# Patient Record
Sex: Male | Born: 1960 | Race: White | Hispanic: No | Smoking: Current every day smoker
Health system: Southern US, Community
[De-identification: ages and names within clinical notes are randomized; demographics above are authoritative.]

## PROBLEM LIST (undated history)

## (undated) DIAGNOSIS — R51 Headache: Secondary | ICD-10-CM

## (undated) DIAGNOSIS — K219 Gastro-esophageal reflux disease without esophagitis: Secondary | ICD-10-CM

## (undated) DIAGNOSIS — J449 Chronic obstructive pulmonary disease, unspecified: Secondary | ICD-10-CM

## (undated) DIAGNOSIS — K5909 Other constipation: Secondary | ICD-10-CM

## (undated) DIAGNOSIS — R519 Headache, unspecified: Secondary | ICD-10-CM

## (undated) DIAGNOSIS — I10 Essential (primary) hypertension: Secondary | ICD-10-CM

## (undated) DIAGNOSIS — F32A Depression, unspecified: Secondary | ICD-10-CM

## (undated) DIAGNOSIS — F329 Major depressive disorder, single episode, unspecified: Secondary | ICD-10-CM

## (undated) HISTORY — DX: Headache: R51

## (undated) HISTORY — DX: Headache, unspecified: R51.9

## (undated) HISTORY — PX: OTHER SURGICAL HISTORY: SHX169

## (undated) HISTORY — PX: APPENDECTOMY: SHX54

## (undated) HISTORY — DX: Other constipation: K59.09

## (undated) HISTORY — PX: UPPER GASTROINTESTINAL ENDOSCOPY: SHX188

## (undated) HISTORY — DX: Gastro-esophageal reflux disease without esophagitis: K21.9

---

## 2001-04-20 ENCOUNTER — Ambulatory Visit (HOSPITAL_COMMUNITY): Admission: RE | Admit: 2001-04-20 | Discharge: 2001-04-20 | Payer: Self-pay | Admitting: Internal Medicine

## 2001-04-20 ENCOUNTER — Encounter: Payer: Self-pay | Admitting: Internal Medicine

## 2001-05-26 ENCOUNTER — Ambulatory Visit (HOSPITAL_COMMUNITY): Admission: RE | Admit: 2001-05-26 | Discharge: 2001-05-26 | Payer: Self-pay | Admitting: Internal Medicine

## 2001-06-26 ENCOUNTER — Ambulatory Visit (HOSPITAL_COMMUNITY): Admission: RE | Admit: 2001-06-26 | Discharge: 2001-06-26 | Payer: Self-pay | Admitting: Internal Medicine

## 2001-06-26 ENCOUNTER — Encounter (INDEPENDENT_AMBULATORY_CARE_PROVIDER_SITE_OTHER): Payer: Self-pay | Admitting: Internal Medicine

## 2001-08-13 ENCOUNTER — Ambulatory Visit (HOSPITAL_COMMUNITY): Admission: RE | Admit: 2001-08-13 | Discharge: 2001-08-13 | Payer: Self-pay | Admitting: Family Medicine

## 2001-08-13 ENCOUNTER — Encounter: Payer: Self-pay | Admitting: Family Medicine

## 2001-08-26 ENCOUNTER — Ambulatory Visit (HOSPITAL_COMMUNITY): Admission: RE | Admit: 2001-08-26 | Discharge: 2001-08-26 | Payer: Self-pay | Admitting: Internal Medicine

## 2001-08-26 ENCOUNTER — Encounter (INDEPENDENT_AMBULATORY_CARE_PROVIDER_SITE_OTHER): Payer: Self-pay | Admitting: Internal Medicine

## 2003-04-12 ENCOUNTER — Encounter: Payer: Self-pay | Admitting: Internal Medicine

## 2003-04-12 ENCOUNTER — Ambulatory Visit (HOSPITAL_COMMUNITY): Admission: RE | Admit: 2003-04-12 | Discharge: 2003-04-12 | Payer: Self-pay | Admitting: Internal Medicine

## 2003-11-02 ENCOUNTER — Ambulatory Visit (HOSPITAL_COMMUNITY): Admission: RE | Admit: 2003-11-02 | Discharge: 2003-11-02 | Payer: Self-pay | Admitting: Internal Medicine

## 2003-11-21 ENCOUNTER — Encounter: Admission: RE | Admit: 2003-11-21 | Discharge: 2003-11-21 | Payer: Self-pay | Admitting: Oncology

## 2003-11-29 ENCOUNTER — Ambulatory Visit (HOSPITAL_COMMUNITY): Admission: RE | Admit: 2003-11-29 | Discharge: 2003-11-29 | Payer: Self-pay | Admitting: Internal Medicine

## 2003-12-19 ENCOUNTER — Ambulatory Visit (HOSPITAL_COMMUNITY): Admission: RE | Admit: 2003-12-19 | Discharge: 2003-12-19 | Payer: Self-pay | Admitting: Internal Medicine

## 2004-01-20 ENCOUNTER — Ambulatory Visit (HOSPITAL_COMMUNITY): Admission: RE | Admit: 2004-01-20 | Discharge: 2004-01-20 | Payer: Self-pay | Admitting: Internal Medicine

## 2004-03-19 ENCOUNTER — Ambulatory Visit (HOSPITAL_COMMUNITY): Admission: RE | Admit: 2004-03-19 | Discharge: 2004-03-19 | Payer: Self-pay | Admitting: Internal Medicine

## 2004-04-13 ENCOUNTER — Ambulatory Visit (HOSPITAL_COMMUNITY): Admission: RE | Admit: 2004-04-13 | Discharge: 2004-04-13 | Payer: Self-pay | Admitting: Internal Medicine

## 2004-05-29 ENCOUNTER — Ambulatory Visit (HOSPITAL_COMMUNITY): Admission: RE | Admit: 2004-05-29 | Discharge: 2004-05-29 | Payer: Self-pay | Admitting: Internal Medicine

## 2004-10-10 ENCOUNTER — Ambulatory Visit (HOSPITAL_COMMUNITY): Admission: RE | Admit: 2004-10-10 | Discharge: 2004-10-10 | Payer: Self-pay | Admitting: Internal Medicine

## 2004-10-18 ENCOUNTER — Ambulatory Visit (HOSPITAL_COMMUNITY): Admission: RE | Admit: 2004-10-18 | Discharge: 2004-10-18 | Payer: Self-pay | Admitting: Internal Medicine

## 2004-11-14 ENCOUNTER — Encounter: Admission: RE | Admit: 2004-11-14 | Discharge: 2004-11-14 | Payer: Self-pay | Admitting: Neurology

## 2004-11-29 ENCOUNTER — Other Ambulatory Visit: Admission: RE | Admit: 2004-11-29 | Discharge: 2004-11-29 | Payer: Self-pay | Admitting: Unknown Physician Specialty

## 2005-08-28 ENCOUNTER — Ambulatory Visit (HOSPITAL_COMMUNITY): Admission: RE | Admit: 2005-08-28 | Discharge: 2005-08-28 | Payer: Self-pay | Admitting: Family Medicine

## 2005-09-03 ENCOUNTER — Ambulatory Visit: Payer: Self-pay | Admitting: Internal Medicine

## 2005-09-10 ENCOUNTER — Emergency Department (HOSPITAL_COMMUNITY): Admission: EM | Admit: 2005-09-10 | Discharge: 2005-09-11 | Payer: Self-pay | Admitting: Emergency Medicine

## 2005-12-02 ENCOUNTER — Ambulatory Visit: Payer: Self-pay | Admitting: Internal Medicine

## 2005-12-23 ENCOUNTER — Ambulatory Visit (HOSPITAL_COMMUNITY): Admission: RE | Admit: 2005-12-23 | Discharge: 2005-12-23 | Payer: Self-pay | Admitting: Internal Medicine

## 2006-07-16 ENCOUNTER — Ambulatory Visit: Payer: Self-pay | Admitting: Internal Medicine

## 2006-08-05 HISTORY — PX: COLONOSCOPY: SHX174

## 2006-11-13 ENCOUNTER — Ambulatory Visit (HOSPITAL_COMMUNITY): Admission: RE | Admit: 2006-11-13 | Discharge: 2006-11-13 | Payer: Self-pay | Admitting: Family Medicine

## 2006-11-26 ENCOUNTER — Ambulatory Visit: Payer: Self-pay | Admitting: Internal Medicine

## 2006-12-08 ENCOUNTER — Ambulatory Visit (HOSPITAL_COMMUNITY): Admission: RE | Admit: 2006-12-08 | Discharge: 2006-12-08 | Payer: Self-pay | Admitting: *Deleted

## 2007-01-12 ENCOUNTER — Ambulatory Visit: Payer: Self-pay | Admitting: Internal Medicine

## 2007-01-12 ENCOUNTER — Ambulatory Visit (HOSPITAL_COMMUNITY): Admission: RE | Admit: 2007-01-12 | Discharge: 2007-01-12 | Payer: Self-pay | Admitting: Internal Medicine

## 2007-01-12 ENCOUNTER — Encounter (INDEPENDENT_AMBULATORY_CARE_PROVIDER_SITE_OTHER): Payer: Self-pay | Admitting: Internal Medicine

## 2007-02-10 ENCOUNTER — Ambulatory Visit: Payer: Self-pay | Admitting: Gastroenterology

## 2008-06-01 ENCOUNTER — Ambulatory Visit (HOSPITAL_COMMUNITY): Admission: RE | Admit: 2008-06-01 | Discharge: 2008-06-01 | Payer: Self-pay | Admitting: Family Medicine

## 2009-04-27 ENCOUNTER — Ambulatory Visit (HOSPITAL_COMMUNITY): Admission: RE | Admit: 2009-04-27 | Discharge: 2009-04-27 | Payer: Self-pay | Admitting: Family Medicine

## 2010-07-02 ENCOUNTER — Ambulatory Visit (HOSPITAL_COMMUNITY): Admission: RE | Admit: 2010-07-02 | Discharge: 2010-07-02 | Payer: Self-pay | Admitting: Family Medicine

## 2010-10-08 ENCOUNTER — Ambulatory Visit (HOSPITAL_COMMUNITY)
Admission: RE | Admit: 2010-10-08 | Discharge: 2010-10-08 | Payer: BC Managed Care – PPO | Source: Ambulatory Visit | Attending: Internal Medicine | Admitting: Internal Medicine

## 2010-10-09 ENCOUNTER — Ambulatory Visit (HOSPITAL_COMMUNITY)
Admission: RE | Admit: 2010-10-09 | Discharge: 2010-10-09 | Disposition: A | Discharge status: Home / Self Care | Payer: BC Managed Care – PPO | Source: Ambulatory Visit | Attending: Internal Medicine | Admitting: Internal Medicine

## 2010-10-09 DIAGNOSIS — R0989 Other specified symptoms and signs involving the circulatory and respiratory systems: Secondary | ICD-10-CM | POA: Insufficient documentation

## 2010-10-09 DIAGNOSIS — R0609 Other forms of dyspnea: Secondary | ICD-10-CM | POA: Insufficient documentation

## 2010-10-09 LAB — BLOOD GAS, ARTERIAL
Acid-Base Excess: 1.1 mmol/L (ref 0.0–2.0)
Acid-base deficit: 1.6 mmol/L (ref 0.0–2.0)
FIO2: 21 %
O2 Saturation: 98.9 %
TCO2: 21.2 mmol/L (ref 0–100)
pCO2 arterial: 35.2 mmHg (ref 35.0–45.0)

## 2010-12-18 NOTE — Op Note (Signed)
[name], [name]             ACCOUNT NO.:  [id number]   MEDICAL RECORD NO.:  [id number]          PATIENT TYPE:  AMB   LOCATION:  DAY                           FACILITY:  [facility]   PHYSICIAN:  Lionel December, M.D.    DATE OF BIRTH:  Feb 19, 1961   DATE OF PROCEDURE:  01/12/2007  DATE OF DISCHARGE:                               OPERATIVE REPORT   PROCEDURE:  Colonoscopy.   INDICATION:  Isaac Rose is a 50 year old Caucasian male who was recently  evaluated per Dr. Yetta Numbers for abdominal cramps.  He had a  CT.  which showed thickening to the ascending colon as well as the hepatic  flexure and mesenteric stranding.  He had a scant amount of fluid in the  paracolic gutter.  The patient was treated with antibiotics.  He was  seen by [place] about 6 weeks ago.  He was also having chest pain.  Therefore, his colonoscopy was postponed.  He has seen Dr. Domingo Sep and  his cardiac evaluation has been negative.  He still has intermittent  abdominal cramps but has not had pain as he had had in April 2008.  He  is undergoing diagnostic colonoscopy.  The procedure risks were reviewed  with the patient, informed consent was obtained.   MEDICATIONS FOR CONSCIOUS SEDATION:  Demerol 50 mg IV, Versed 10 mg IV.   FINDINGS:  Procedure performed in endoscopy suite.  The patient's vital  signs and O2 saturation were monitored during procedure and remained  stable.  The patient complained of intense anal pain.  He states it  started last night after he took prep.  Digital exam was difficult  because of intense spasm of sphincter ani.  Lidocaine jelly was applied  to anal canal.  After a few minutes I was able to complete the digital  exam and passed the pediatric scope across the anal canal into rectum.  The scope was passed into the sigmoid colon and beyond.  Preparation was  excellent.  A somewhat redundant colon but the scope was advanced to the  cecum, which was identified by the appendiceal orifice and  ileocecal  valve.  A short segment of TI was also examined and was normal.  As the  scope was withdrawn, colonic mucosa was carefully examined.  There were  2 small polyps at the ascending colon, both of which were biopsied, and  2 small polyps were noted at the ascending colon, which were ablated via  cold biopsy and submitted in one container.  Mucosa was normal.  There  were no diverticular changes.  The rectal mucosa similarly was normal.  The scope was retroflexed to examine anorectal junction.  No anal  fissure was evident.  He had small hemorrhoids below the dentate line  and 2 anal papillae.  Endoscope was straightened and withdrawn.  The  patient tolerated the procedure well.   FINAL DIAGNOSES:  1. Digital rectal exam suspicious for anal fissure but none seen on      retroflex view.  He could have small fissure, which could easily      have been missed.  2.  No evidence of colitis or tumor mass involving his right colon.      Two 3-4 mm polyps ablated via cold biopsy from the ascending colon.  3. I would suspect that he had antral colitis or infection.  If his      symptoms relapse, would consider further evaluation with CT      angiogram.   RECOMMENDATIONS:  1. He will resume his usual diet and medications.  2. I will contact the patient with results of biopsy.  3. If pain recurs, he will give [place] a call.      Lionel December, M.D.  Electronically Signed     NR/MEDQ  D:  01/12/2007  T:  01/12/2007  Job:  [id number]

## 2010-12-18 NOTE — Assessment & Plan Note (Signed)
NAMEMarland Kitchen  Isaac Rose, Isaac Rose              CHART#:  [id number]   DATE:  02/10/2007                       DOB:  09/24/1960   REFERRING PHYSICIAN:  Kirk Ruths, M.D.   PROBLEM LIST:  1. Gastroesophageal reflux disease.  2. Chronic epigastric pain.  3. Constipation.  4. Adenomatous polyp diagnosed in June of 2008.  5. Hemorrhoids.  6. Tobacco abuse.  7. Hypertension.  8. Hyperlipidemia.   SUBJECTIVE:  Isaac Rose is a 50 year old male who is seen as a new  patient visit to me.  He was seen by Dr. Karilyn Cota.  He complains of still  having to strain with bowel movements, but things are better with  MiraLax 17 g once a day.  He did have an episode last night where he ate  and had stomach cramps as soon as he finished eating.  The pain was  located around his navel.  He had 2 soft bowel movements this morning.  Sometimes, he may go 3 to 4 days until he has another bowel movement.  He eats a big bowl of fiber before going to bed every night.  He drinks  very little water.  He prefers to drink coffee and diet [facility].  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  Once a week, he has sharp pain in his rectum that  lasts 5 to 10 seconds.  He has moderate rectal itching.  He was given a  prescription by Dr. Karilyn Cota for [facility], which helped his itching, but  the knots are still there.  He has been increased from 81 mg aspirin  to 325 mg of aspirin, but is not on any proton pump inhibitor.   PAST SURGICAL HISTORY:  Appendectomy.   ALLERGIES:  No known drug allergies.   MEDICATIONS:  1. Enalapril 10 mg daily.  2. Zocor 20 mg daily.  3. Carafate 1 g as needed.  4. Aspirin 325 mg daily.   FAMILY HISTORY:  He has maternal aunts and uncles who had colon cancer.   SOCIAL HISTORY:  He is married and works at [employer]  in [employer].  He also has a step-son who is wheelchair  bound and he needs to move  around.  His step-son weighs over 100 pounds.  He smokes, but he does not drink any alcohol.   OBJECTIVE:  Weight 165 pounds, height 5 feet 9 inches, temperature 97.7,  blood pressure 142/82, pulse 64.  GENERAL:  He is in no apparent distress.  Alert and oriented x4.  HEENT:  Normocephalic, atraumatic.  Pupils equal, round, and reactive to light.  Mouth with no oral lesions.  Posterior oropharynx without any erythema  or exudate.  NECK:  Full range of motion and no lymphadenopathy.  LUNGS:  Clear to auscultation bilaterally.  CARDIOVASCULAR:  Regular rhythm.  No murmur.  Normal S1, S2.  ABDOMEN:  Bowel sounds are present, soft, nondistended.  Mild to moderate  tenderness to palpation in the epigastrium without rebound or guarding.  Also tenderness to palpation, which is mild in the right upper quadrant  and the left upper quadrant.  EXTREMITIES:  Without cyanosis, clubbing, or edema. NEURO:  He has no  focal neurological deficit.   PREVIOUS WORKUP:  Colonoscopy in June 2008.  CT  scan of the abdomen and  pelvis in April 2008 that showed focal thickening of the colon wall of  the proximal and mid-ascending colon up to the hepatic flexure.  No  colonic diverticula were visible.  Liver, gallbladder, pancreas, and  aorta were normal.  Ultrasound in May 2007 showed no acute  intraabdominal process.  Endoscopic ultrasound in December of 2005  revealed a normal pancreas.  HIDA scan in October 2005 showed a  gallbladder ejection fraction of 51%.  CT angiography in June 2005  showed widely patent mesenteric arteries.  Small bowel follow through in  May 2005 was normal.  EGD in April 2005 showed a small hiatal hernia.  Barium esophagram in November 2002 showed no evidence of motility  disturbance, but several episodes of spontaneous gastroesophageal  reflux.  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  A CT scan in September 2002 showed  a small left renal cyst.  EGD performed in 1999 with gastric biopsy  showed chronic active Helicobacter associated gastritis.  He also had  had a CEA, which has been mildly elevated since 1999, up to 13.5.   LABS:  White count 13.5, hemoglobin 14.9, platelets 285, BUN 11,  creatinine 0.85, albumin 4.4, AST 14, ALT less than 8.  TSH 0.79,  calcium 9.5 (labs drawn in April 2008).   ASSESSMENT:  Isaac Rose is a 50 year old male with gastroesophageal  reflux disease.  He is currently not having any symptoms.  He also has  chronic abdominal pain and has had an extensive negative workup, and it  is likely secondary to irritable bowel syndrome, constipation  predominant.  He continues to complain of constipation, which has not  been adequately treated based on his persistent symptoms.  He has a  history of adenomatous polyps diagnosed in June 2008.  Thank you for  allowing me to see Isaac Rose in consultation.  My recommendations are  to follow.   RECOMMENDATIONS:  1. For management of hemorrhoids and constipation, the patient is      given his discharge instructions.  He is asked to drink 6 to 8 cups      of water or juice daily.  He also should follow a high fiber diet.      He is given a handout on high fiber diet.  He is asked to use      MiraLax twice daily.  2. He is given a prescription for Anusol HC twice daily per rectum for      14 days.  3. He is asked to avoid lifting greater than 10 pounds without      assistance for 2 weeks.  He says he is unable to comply with this      because of his step-son.  He may use witch-hazel pads for relief of      itching pain.  He may also use Tylenol, ibuprofen, or Aleve.  4. I did recommend that he continue his proton pump inhibitor due to      his history of      gastroesophageal reflux disease and his chronic aspirin use.  5. Outpatient visit in 6 weeks.        Kassie Mends, M.D.  Electronically Signed     SM/MEDQ  D:  02/10/2007  T:  02/10/2007  Job:  [id number]   cc:   Kirk Ruths, M.D.

## 2010-12-21 NOTE — Consult Note (Signed)
Isaac Rose, Isaac Rose             ACCOUNT NO.:  [id number]   MEDICAL RECORD NO.:  [id number]          PATIENT TYPE:  AMB   LOCATION:                                FACILITY:  [facility]   PHYSICIAN:  Lionel December, M.D.    DATE OF BIRTH:  Aug 09, 1960   DATE OF CONSULTATION:  11/26/2006  DATE OF DISCHARGE:                                 CONSULTATION   REASON FOR CONSULTATION:  Abnormal CT scan.   PHYSICIAN REQUESTING CONSULTATION:  Kirk Ruths, M.D.   HISTORY OF PRESENT ILLNESS:  The patient is a 50 year old Caucasian  gentleman who presents today for further evaluation of recent abnormal  CT scan.  He had a CT done for abdominal cramping.  This was on November 05, 2006.  He had considerable focal thickening in the colonic wall of the  proximal and mid ascending colon up to the hepatic flexure.  There was  some mesenteric stranding and a tiny amount of fluid in the pericolic  gutter adjacent to this area.  Differential includes a possibility of  Crohn's disease or focal colitis versus malignancy.  At the same time,  the patient had a white count 13,500, hemoglobin was normal at 14.9.  LFTs normal.  TSH normal.  Rectal exam revealed heme-positive stools.   The patient states that he went for a routine physical.  The evening  before he had developed lower abdominal crampy-type pain.  He denies any  diarrhea.  He does have chronic constipation, generally has two to three  stools a week.  He often will have fresh blood per rectum especially if  he passes a large stool.  He has been on Cipro for about 3 weeks and has  another week to complete the course.  He also has heartburn, is not on  any PPI therapy for unclear reasons.  He denies any recent weight  changes.  He has a history of chronic epigastric pain felt to be due to  dyspepsia although this is been quiescent for some time.   CURRENT MEDICATIONS:  1. Enalapril 10 mg daily.  2. Zocor 20 mg daily.  3. Cipro 500 mg b.i.d.  4.  Carafate 1 gram per 10 mL p.r.n.  5. Aspirin 325 mg daily.   ALLERGIES:  NO KNOWN DRUG ALLERGIES.   PAST MEDICAL HISTORY:  COPD, hypercholesterolemia, hypertension, chronic  dyspepsia, gastroesophageal reflux disease. Previous EUS in December  2005 revealed essentially normal pancreas.  His last colonoscopy was in  September 2005 which was normal.  He has a previous history of elevated  CEA but felt to be due to his smoking cigarettes.  His last EGD was in  April 2006 revealing focal gastritis.  He has also had a negative  abdominal ultrasound except for left renal cyst.  HIDA scan with EF of  51%.  He has had an appendectomy and surgery for subdural hematoma.   FAMILY HISTORY:  Mother died of lung cancer at age 33.  Father died at  age 92 of COPD and kidney failure.  One sister had surgical resection of  a kidney tumor.  He had a brother who is a quadriplegic due to MVA.  He  states he has multiple aunts and uncles on his mother's side who have  had colon cancer.   SOCIAL HISTORY:  He is married.  Smokes three packs of cigarettes daily.  Denies any alcohol use.  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  He has 2 stepchildren.   REVIEW OF SYSTEMS:  See HPI for GI.  CONSTITUTIONAL:  No weight loss.  CARDIOPULMONARY:  He has chronic cough and some dyspnea on exertion.  Recently underwent PFTs, do not have those results available.  Complains of frequent headaches which are chronic.  Denies any chest  pain.  GENITOURINARY:  Denies any gross hematuria but recently did have  microscopic hematuria.   PHYSICAL EXAMINATION:  Weight 166, height 5 feet 9.  Temperature 97.9,  blood pressure 126/80, pulse 70.  GENERAL:  Pleasant, thin, Caucasian male in no acute distress.  SKIN:  Warm and dry.  No jaundice.  HEENT:  Sclerae nonicteric.  Oropharyngeal mucosa moist and pink.  No  lesions, erythema or exudate.  No lymphadenopathy or thyromegaly.  CHEST:  Lungs are  clear auscultation.  CARDIAC:  Exam reveals a regular rate and rhythm.  Normal S1 S2.  No  murmurs, rubs or gallops.  ABDOMEN:  Positive bowel sounds, flat,  nondistended.  He has mild epigastric tenderness as well as mild  tenderness in the low mid abdomen just right of midline.  No  organomegaly or masses.  No rebound tenderness or guarding.  No  abdominal bruits or hernias.  EXTREMITIES:  No edema.   IMPRESSION:  1. Severe focal thickening and inflammation in the ascending colon      seen on recent CT needs further mastication.  His last colonoscopy      was approximately 3 years ago.  This may be due to inflammatory      bowel disease or infectious colitis.  Less likely due to malignancy      with fairly recent colonoscopy.  2. Chronic epigastric pain.  Doing fairly well at this time.  Felt to      be due to dyspepsia based on multiple previous studies.  3. Gastroesophageal reflux disease.  Treated with over-the-counter      agent.   PLAN:  Colonoscopy in near future.  Will hold aspirin for 4 days prior  to the procedure.  Further recommendations to follow.      Tana Coast, P.A.      Lionel December, M.D.  Electronically Signed   LL/MEDQ  D:  11/26/2006  T:  11/26/2006  Job:  [id number]   cc:   Kirk Ruths, M.D.  Fax: [phone]

## 2010-12-21 NOTE — Procedures (Signed)
Isaac Rose, Isaac Rose             ACCOUNT NO.:  [id number]   MEDICAL RECORD NO.:  [id number]          PATIENT TYPE:  OUT   LOCATION:  RESP                          FACILITY:  [facility]   PHYSICIAN:  Edward L. Juanetta Gosling, M.D.DATE OF BIRTH:  [birth year]   DATE OF PROCEDURE:  DATE OF DISCHARGE:                            PULMONARY FUNCTION TEST   PULMONARY FUNCTION TEST:  1. Spirometry shows no evidence of a ventilatory defect but does show      mild airflow obstruction.  2. Lung volumes are normal.  3. DLCO is normal.  4. Arterial blood gases are normal.  5. There is no significant bronchodilator effect.      Edward L. Juanetta Gosling, M.D.  Electronically Signed     ELH/MEDQ  D:  11/13/2006  T:  11/13/2006  Job:  [id number]   cc:   Kirk Ruths, M.D.  Fax: [phone]

## 2010-12-21 NOTE — Op Note (Signed)
NAME:  Isaac Rose, Isaac Rose                       ACCOUNT NO.:  [id number]   MEDICAL RECORD NO.:  [id number]                   PATIENT TYPE:  AMB   LOCATION:  DAY                                  FACILITY:  [facility]   PHYSICIAN:  Isaac Rose, M.D.                 DATE OF BIRTH:  06/19/1961   DATE OF PROCEDURE:  04/13/2004  DATE OF DISCHARGE:                                 OPERATIVE REPORT   PROCEDURE:  Total colonoscopy.   INDICATIONS FOR PROCEDURE:  Isaac Rose is a 50 year old Caucasian male with a  several-month history of unexplained epigastric pain and 30 to 40 pound  weight loss who has undergone multiple studies.  He now has developed  constipation.  His CEA was elevated at over 9.  Apparently, this was also  elevated a few years ago.  His last colonoscopy was over six years ago and  was reportedly normal.  Family history is positive for colon carcinoma in  two maternal uncles who recently died of it.  The patient is undergoing  colonoscopy primarily for diagnostic purposes.  The procedure risks were  reviewed with the patient, and informed consent was obtained.   PREOPERATIVE MEDICATIONS:  Demerol 50 mg IV, Versed 10 mg IV in divided  dose.   FINDINGS:  The procedure was performed in the endoscopy suite.  The  patient's vital signs and O2 saturations were monitored during the procedure  and remained stable.  The patient was placed in the left lateral recumbent  position and rectal examination performed.  He had increased tone to the  external sphincter, but no other abnormality was noted.  The Olympus  videoscope was placed into the rectum and advanced into the region of the  sigmoid colon.  Redundant sigmoid colon.  The preparation was excellent.  The scope was then passed into the hepatic flexure area.  He tended to form  a loop which could not be reduced until the scope was pulled back to the  distal sigmoid colon.  The patient was placed in the supine position, and  the scope was  advanced.  This time, I was able to keep the scope straight.  The scope was passed to the cecum which was identified by the appendiceal  orifice and ileocecal valve.  Pictures were taken for the record.  As the  scope was withdrawn, the colonic mucosa was carefully examined and was  normal throughout.  The colonic mucosa was carefully examined.  There was  cystic submucosal swelling in the area of the hepatic flexure, possibly a  lymphocele.  The mucosa over it was normal.  It was left alone.  The mucosa  of the rest of the colon was normal.  The rectal mucosa similarly was  normal.  The scope was retroflexed to examine the anorectal junction which  was unremarkable.  The endoscope was straightened and withdrawn.  The  patient tolerated the procedure well.  FINAL DIAGNOSIS:  Normal colonoscopy.   RECOMMENDATIONS:  1.  High fiber diet plus Citrucel or Fiber Choice daily.  2.  Colace two tablets at bedtime.  3.  As far as his persistent epigastric pain is concerned, I would recommend      EUS and ERCP with biliary manometry.  If he is agreeable, will arrange      for him to go to a tertiary center.      ___________________________________________                                            Isaac Rose, M.D.   NR/MEDQ  D:  04/13/2004  T:  04/13/2004  Job:  [id number]   cc:   Madelin Rear. Sherwood Gambler, M.D.  [address]  [place]  [place] [address]  Fax: [phone]

## 2010-12-21 NOTE — Op Note (Signed)
NAME:  Isaac Rose, Isaac Rose                       ACCOUNT NO.:  [id number]   MEDICAL RECORD NO.:  [id number]                   PATIENT TYPE:  AMB   LOCATION:  DAY                                  FACILITY:  [facility]   PHYSICIAN:  Lionel December, M.D.                 DATE OF BIRTH:  Mar 17, 1961   DATE OF PROCEDURE:  11/29/2003  DATE OF DISCHARGE:                                 OPERATIVE REPORT   PROCEDURE:  Esophagogastroduodenoscopy.   ENDOSCOPIST:  Lionel December, M.D.   INDICATIONS:  Fordyce is a 50 year old Caucasian male who has epigastric  pain, early satiety, and has lost 22 pounds in the last 2-3 months.  The  only change he has made in his diet is that he has gone from regular to diet  drinks.  He has a history of GERD.  Presently he is not on any therapy.  He  was on Aciphex which was not covered by his plan and he states that Nexium  did not help.  He had chest and abdominopelvic CT recently.  Dr. Mariel Sleet  reviewed the studies and feels that his gastric wall is thickened.  He is,  therefore, undergoing diagnostic EGD.  He was treated for H. pylori  gastritis about 2-1/2 years ago.  Procedure and risks were reviewed with the  patient and informed consent was obtained.   PREOPERATIVE MEDICATIONS:  Cetacaine spray for oropharyngeal topical  anesthesia, Demerol 50 mg IV and Versed 10 mg IV in divided dose.   FINDINGS:  Procedure performed in endoscopy suite.  The patient's vital  signs and O2 saturation were monitored during the procedure and remained  stable.  The patient was placed in the left lateral recumbent position and  Olympus videoscope was passed via the oropharynx without any difficulty into  the esophagus.   ESOPHAGUS:  Mucosa of the esophagus normal throughout.  Squamocolumnar  junction was unremarkable.  No ring was noted, as on his previous EGD in  October 2002.  He had a small sliding hiatal hernia.   STOMACH:  It was empty and distended very well with  insufflation.  The folds  of the proximal stomach were normal.  Examination of the mucosa at body and  antrum was normal.  Pyloric channel was patent.  Angularis was normal.  There was a small abnormal area of gastric mucosa at fundus which was raised  and erythematous.  Pictures taken for the record along with biopsies for  histology.   DUODENUM:  Examination of the bulb reveals normal mucosa.  The scope was  passed into the second part of the duodenum where the mucosa and folds were  normal.   Endoscope was withdrawn.  The patient tolerated the procedure well.   FINAL DIAGNOSES:  1. Small focus of abnormal gastric mucosa at fundus, possibly focal     gastritis, multiple biopsies taken from this area.  2. Small sliding hiatal hernia.  3. No evidence of peptic ulcer disease.   RECOMMENDATIONS:  1. He will continue antireflux measures as before.  I would like for him to     try omeprazole 20 mg p.o. q.a.m. and Levbid and dicyclomine 20 mg before     breakfast and lunch.  2. I will be contacting the patient with biopsy results.  3. The patient was also strongly advised to quit cigarette smoking.  He may     want to review his options with Dr. Sherwood Gambler.  4. I would plan to see him in the office in 6 to 8 weeks depending on what     the biopsy results show.      ___________________________________________                                            Lionel December, M.D.   NR/MEDQ  D:  11/29/2003  T:  11/29/2003  Job:  [id number]   cc:   Ladona Horns. Neijstrom, MD  [address]  [place]  [place] [address]  Fax: [phone]   Madelin Rear. Sherwood Gambler, M.D.  [address]  [place]  [place] [address]  Fax: [phone]

## 2011-03-05 ENCOUNTER — Encounter (INDEPENDENT_AMBULATORY_CARE_PROVIDER_SITE_OTHER): Payer: Self-pay

## 2011-03-28 ENCOUNTER — Encounter (INDEPENDENT_AMBULATORY_CARE_PROVIDER_SITE_OTHER): Payer: Self-pay | Admitting: Internal Medicine

## 2011-03-28 ENCOUNTER — Ambulatory Visit (INDEPENDENT_AMBULATORY_CARE_PROVIDER_SITE_OTHER): Payer: BC Managed Care – PPO | Admitting: Internal Medicine

## 2011-03-28 DIAGNOSIS — K5900 Constipation, unspecified: Secondary | ICD-10-CM

## 2011-03-28 DIAGNOSIS — K219 Gastro-esophageal reflux disease without esophagitis: Secondary | ICD-10-CM

## 2011-03-28 NOTE — Progress Notes (Signed)
Subjective:     Patient ID: Isaac Rose, male   DOB: 10-15-1960, 50 y.o.   MRN: [id number]  HPI  [name] presents today for follow up.  He has a history of constipation. He tells me his constipation is somewhat better.  He will take the Miralax on a prn basis. If he eats foods with protein in it, he does not have any problems.   He will have acid reflux if he eats spicy   His appetite is good. No weight loss. He does snack a lot.  No melena or bright red rectal bleeding. His epigastric pain is much better.  He says his diet is much better.  He usually has a BM about twice a week. EUS at [facility] 07/12/2004 for abdominal pain and pancreatitis. Essentially normal pancreas. Colonoscopy in 2008 by Dr. Karilyn Cota.L Adenomatous polyps. No high grade dysplasia or invasive malignancy identified. Repeat TCS in 5 yrs. 2012.  Review of Systems  See hpi  Past Medical History  Diagnosis Date  . Chronic constipation   . GERD (gastroesophageal reflux disease)        Past Surgical History  Procedure Date  . Colonoscopy 08    NUR  . Appendectomy   . Head trauma     from trauma   Current Outpatient Prescriptions  Medication Sig Dispense Refill  . albuterol (PROVENTIL) (2.5 MG/3ML) 0.083% nebulizer solution Take 2.5 mg by nebulization every 6 (six) hours as needed.        Marland Kitchen buPROPion (WELLBUTRIN XL) 150 MG 24 hr tablet Take 150 mg by mouth daily.        . enalapril (VASOTEC) 10 MG tablet Take 10 mg by mouth daily.        . polyethylene glycol (MIRALAX / GLYCOLAX) packet Take 17 g by mouth daily.        . salmeterol (SEREVENT) 50 MCG/DOSE diskus inhaler Inhale 1 puff into the lungs 2 (two) times daily.        Marland Kitchen dicyclomine (BENTYL) 20 MG tablet Take 20 mg by mouth every 6 (six) hours.        . mesalamine (LIALDA) 1.2 G EC tablet Take by mouth daily with breakfast.        . RABEprazole (ACIPHEX) 20 MG tablet Take 20 mg by mouth daily.         Allergies: Effexor  Objective:   Physical Exam Blood pressure  122/72, pulse 72, temperature 97.6 F (36.4 C), height 5\' 9"  (1.753 m), weight 172 lb 3.2 oz (78.109 kg).  Alert and oriented. Skin warm and dry. Oral mucosa is moist.. Sclera anicteric, conjunctivae is pink. Thyroid not enlarged. No cervical lymphadenopathy. Lungs clear. Heart regular rate and rhythm.  Abdomen is soft. Bowel sounds are positive. No hepatomegaly. No abdominal masses felt. No tenderness.  No edema to lower extremities. Patient is alert and oriented.      Assessment:    Constipation which he takes Miralax on a prn basis. He is having two stools a week which he really is not satisfied with. He also has occasionally GERD and it presently not on a PPI. He knows what foods to avoid.  He at this time does not want a PPI prescribed.      Plan:     Watch spicy foods. Follow up in one year unless you have a problem. GERD diet given to patient. Amitiza samples 24 mcg given to patient to try.

## 2011-03-28 NOTE — Patient Instructions (Signed)
Gerd diet given to patient. Follow up in one year

## 2011-11-28 ENCOUNTER — Ambulatory Visit (HOSPITAL_COMMUNITY)
Admission: RE | Admit: 2011-11-28 | Discharge: 2011-11-28 | Disposition: A | Discharge status: Home / Self Care | Payer: BC Managed Care – PPO | Source: Ambulatory Visit | Attending: Physician Assistant | Admitting: Physician Assistant

## 2011-11-28 ENCOUNTER — Other Ambulatory Visit (HOSPITAL_COMMUNITY): Payer: Self-pay | Admitting: Physician Assistant

## 2011-11-28 DIAGNOSIS — J9819 Other pulmonary collapse: Secondary | ICD-10-CM | POA: Insufficient documentation

## 2011-11-28 DIAGNOSIS — R079 Chest pain, unspecified: Secondary | ICD-10-CM | POA: Insufficient documentation

## 2012-02-12 ENCOUNTER — Encounter (INDEPENDENT_AMBULATORY_CARE_PROVIDER_SITE_OTHER): Payer: Self-pay | Admitting: *Deleted

## 2012-03-11 ENCOUNTER — Encounter (INDEPENDENT_AMBULATORY_CARE_PROVIDER_SITE_OTHER): Payer: Self-pay | Admitting: *Deleted

## 2012-04-28 ENCOUNTER — Ambulatory Visit (INDEPENDENT_AMBULATORY_CARE_PROVIDER_SITE_OTHER): Payer: BC Managed Care – PPO | Admitting: Internal Medicine

## 2012-04-28 ENCOUNTER — Encounter (INDEPENDENT_AMBULATORY_CARE_PROVIDER_SITE_OTHER): Payer: Self-pay | Admitting: Internal Medicine

## 2012-04-28 VITALS — BP 110/68 | HR 66 | Temp 98.7°F | Resp 18 | Ht 69.0 in | Wt 171.1 lb

## 2012-04-28 DIAGNOSIS — F17200 Nicotine dependence, unspecified, uncomplicated: Secondary | ICD-10-CM

## 2012-04-28 DIAGNOSIS — I10 Essential (primary) hypertension: Secondary | ICD-10-CM | POA: Insufficient documentation

## 2012-04-28 DIAGNOSIS — Z86010 Personal history of colonic polyps: Secondary | ICD-10-CM

## 2012-04-28 DIAGNOSIS — Z720 Tobacco use: Secondary | ICD-10-CM

## 2012-04-28 DIAGNOSIS — J449 Chronic obstructive pulmonary disease, unspecified: Secondary | ICD-10-CM | POA: Insufficient documentation

## 2012-04-28 DIAGNOSIS — Z860101 Personal history of adenomatous and serrated colon polyps: Secondary | ICD-10-CM | POA: Insufficient documentation

## 2012-04-28 DIAGNOSIS — K5900 Constipation, unspecified: Secondary | ICD-10-CM | POA: Insufficient documentation

## 2012-04-28 MED ORDER — POLYETHYLENE GLYCOL 3350 17 G PO PACK: 17.0000 g | PACK | Freq: Every day | ORAL | Status: DC

## 2012-04-28 NOTE — Progress Notes (Signed)
Presenting complaint;  Followup for constipation and epigastric pain.  Subjective:  Patient is 51 year old Caucasian male who is here for scheduled visit. He was last seen in August 2012 for constipation. He was begun on MiraLax. He is generally taking it twice a week. He is only having one bowel movement per day. His stool is usually hard and dry. He denies melena or rectal bleeding. He is afraid that he will develop diarrhea if he took MiraLax more frequently. He continues to have intermittent epigastric pain and nothing like he had 5 or 6 years ago and had extensive workup. He has very good appetite and eats a lot of snacks. His weight has remained stable since his last visit. He denies nausea vomiting or heartburn. He is still smoking cigarettes but has decreased from 3 to 1-1/2 pack per day. He was recently diagnosed with COPD. He is on Wellbutrin for this reason.   Current Medications: Current Outpatient Prescriptions  Medication Sig Dispense Refill  . albuterol (PROVENTIL) (2.5 MG/3ML) 0.083% nebulizer solution Take 2.5 mg by nebulization every 6 (six) hours as needed.        Marland Kitchen aspirin 325 MG tablet Take 325 mg by mouth daily.      Marland Kitchen buPROPion (WELLBUTRIN XL) 150 MG 24 hr tablet Take 150 mg by mouth daily.        . enalapril (VASOTEC) 10 MG tablet Take 10 mg by mouth daily.        . polyethylene glycol (MIRALAX / GLYCOLAX) packet Take 17 g by mouth as needed.       . salmeterol (SEREVENT) 50 MCG/DOSE diskus inhaler Inhale 1 puff into the lungs 2 (two) times daily.         Past medical history; COPD. Chronic constipation. History of colonic adenomas. His last colonoscopy was in June 2008 when he had 2 small tubular adenomas removed from ascending colon. EGD in April 2005 revealing focal gastritis small sliding hiatal hernia. EUS for abdominal pain in April 2005 at [facility]. It was normal. Status post appendectomy. Allergies; Effexor. Family history; Mother died of carcinoma lung at age  19 and father of emphysema at age 16. One maternal uncle died of colon carcinoma at age 91 and another maternal uncle died of colon carcinoma in his 39s or 9s One sister(58) had surgery for renal carcinoma. Second sister(54) has been treated for breast carcinoma and remains in remission. Third sister has intrathoracic aneurysm and is being watched. Brother age 23 has coronary artery disease. Younger brother developed quadriplegia secondary to auto accident and has been in nursing home for few years. Social history; He is married and has 2 stepsons and one has severe disability. He smokes a pack and a half of cigarettes per day. He used to smoke 3 packs per day he's been smoking for over 20 years. He does not drink alcohol. 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    Objective: Blood pressure 110/68, pulse 66, temperature 98.7 F (37.1 C), temperature source Oral, resp. rate 18, height 5\' 9"  (1.753 m), weight 171 lb 1.6 oz (77.61 kg). Patient is alert and in no acute distress Conjunctiva is pink. Sclera is nonicteric Oropharyngeal mucosa is normal. No neck masses or thyromegaly noted. Cardiac exam with regular rhythm normal S1 and S2. No murmur or gallop noted. Lungs are clear to auscultation. Abdomen is flat. Bowel sounds are normal. No bruits noted. Abdomen is soft with mild midepigastric tenderness. No organomegaly or masses noted. No LE edema  or clubbing noted.    Assessment:  #1. Chronic constipation. He is having one bowel movement per week. His goal should be to have 2-3 bowel movements per week. He has room to go up on MiraLax or polyethylene glycol. #2. As she of colonic adenomas and family history positive for various malignancies including colon carcinoma and 2 maternal uncles.   Plan:  Take polyethylene glycol MiraLax half scoop daily. Continue high fiber diet. Colonoscopy next year. Office visit in one year.

## 2012-04-28 NOTE — Patient Instructions (Signed)
Take polyethylene glycol of MiraLax daily but can titrate dose. Colonoscopy to be scheduled next year

## 2013-01-04 ENCOUNTER — Encounter (INDEPENDENT_AMBULATORY_CARE_PROVIDER_SITE_OTHER): Payer: Self-pay | Admitting: *Deleted

## 2013-04-07 ENCOUNTER — Encounter (INDEPENDENT_AMBULATORY_CARE_PROVIDER_SITE_OTHER): Payer: Self-pay | Admitting: *Deleted

## 2013-04-28 ENCOUNTER — Ambulatory Visit (INDEPENDENT_AMBULATORY_CARE_PROVIDER_SITE_OTHER): Payer: BC Managed Care – PPO | Admitting: Internal Medicine

## 2013-07-05 ENCOUNTER — Other Ambulatory Visit (INDEPENDENT_AMBULATORY_CARE_PROVIDER_SITE_OTHER): Payer: Self-pay | Admitting: Internal Medicine

## 2015-01-02 ENCOUNTER — Other Ambulatory Visit (HOSPITAL_COMMUNITY): Payer: Self-pay | Admitting: Family Medicine

## 2015-01-02 DIAGNOSIS — R51 Headache: Principal | ICD-10-CM

## 2015-01-02 DIAGNOSIS — R519 Headache, unspecified: Secondary | ICD-10-CM

## 2015-01-05 ENCOUNTER — Ambulatory Visit (HOSPITAL_COMMUNITY)
Admission: RE | Admit: 2015-01-05 | Discharge: 2015-01-05 | Disposition: A | Discharge status: Home / Self Care | Payer: BC Managed Care – PPO | Source: Ambulatory Visit | Attending: Family Medicine | Admitting: Family Medicine

## 2015-01-05 DIAGNOSIS — I10 Essential (primary) hypertension: Secondary | ICD-10-CM | POA: Diagnosis not present

## 2015-01-05 DIAGNOSIS — Z720 Tobacco use: Secondary | ICD-10-CM | POA: Diagnosis not present

## 2015-01-05 DIAGNOSIS — R51 Headache: Secondary | ICD-10-CM | POA: Diagnosis present

## 2015-01-05 DIAGNOSIS — R519 Headache, unspecified: Secondary | ICD-10-CM

## 2015-02-12 ENCOUNTER — Other Ambulatory Visit (INDEPENDENT_AMBULATORY_CARE_PROVIDER_SITE_OTHER): Payer: Self-pay | Admitting: Internal Medicine

## 2015-02-20 NOTE — Telephone Encounter (Signed)
Patient has not been seen since 2013, per Dr.Rehman the patient  Will need to have an appointment prior to further refills.

## 2015-02-27 NOTE — Telephone Encounter (Signed)
Apt has been scheduled for 03/07/15 with Terri Setzer, NP 

## 2015-03-07 ENCOUNTER — Ambulatory Visit (INDEPENDENT_AMBULATORY_CARE_PROVIDER_SITE_OTHER): Payer: BC Managed Care – PPO | Admitting: Internal Medicine

## 2015-03-20 ENCOUNTER — Ambulatory Visit (INDEPENDENT_AMBULATORY_CARE_PROVIDER_SITE_OTHER): Payer: BC Managed Care – PPO | Admitting: Neurology

## 2015-03-20 ENCOUNTER — Other Ambulatory Visit: Payer: BC Managed Care – PPO

## 2015-03-20 ENCOUNTER — Encounter: Payer: Self-pay | Admitting: Neurology

## 2015-03-20 VITALS — BP 140/82 | HR 72 | Resp 20 | Ht 69.0 in | Wt 154.3 lb

## 2015-03-20 DIAGNOSIS — R519 Headache, unspecified: Secondary | ICD-10-CM

## 2015-03-20 DIAGNOSIS — I679 Cerebrovascular disease, unspecified: Secondary | ICD-10-CM | POA: Diagnosis not present

## 2015-03-20 DIAGNOSIS — R51 Headache: Principal | ICD-10-CM

## 2015-03-20 DIAGNOSIS — Z720 Tobacco use: Secondary | ICD-10-CM

## 2015-03-20 MED ORDER — TOPIRAMATE 50 MG PO TABS: ORAL_TABLET | ORAL | Status: DC

## 2015-03-20 MED ORDER — TRAMADOL HCL 50 MG PO TABS: 50.0000 mg | ORAL_TABLET | Freq: Three times a day (TID) | ORAL | Status: DC | PRN

## 2015-03-20 MED ORDER — PREDNISONE 10 MG PO TABS: ORAL_TABLET | ORAL | Status: DC

## 2015-03-20 NOTE — Progress Notes (Signed)
NEUROLOGY CONSULTATION NOTE  Isaac Rose MRN: [id number] DOB: 02/17/1961  Referring provider: Terie Purser, PA-C Primary care provider: Elfredia Nevins, MD  Reason for consult:  headache  HISTORY OF PRESENT ILLNESS: Isaac Rose is a 54 year old right-handed male with hypertension, COPD, GERD and current smoker who presents for headache.  History obtained by patient and PCP note.  Images of head CT reviewed.  History obtained by patient and PCP note.  Onset:  "All my life" but worse over past 2 to 3 months. Location:  Right parietal region and across back of head.  No neck pain. Quality:  throbbing Intensity:  10/10 Aura:  no Prodrome:  no Associated symptoms:  None.  No nausea, photophobia, phonophobia, visual disturbance or autonomic symptoms. Duration:  All day Frequency:  daily Triggers/exacerbating factors:  none Relieving factors:  none Activity:  Able to function  Past abortive medication:  Fioricet, Excedrin, Tylenol, Relpax Past preventative medication:  none Other past therapy:  none  Current abortive medication:  Ibuprofen (was taking almost daily up until a couple of weeks ago). Antihypertensive medications:  enalapril maleate  Antidepressant medications:  none Anticonvulsant medications:  none Vitamins/Herbal/Supplements:  none Other therapy:  none Other medication:  ASA , Servent Diskus, Ventolin HFA  He had a CT of the head performed on 01/06/15, which showed small right remote parietal infarct, as was seen on prior MRI in 2006, as well small vessel disease and mild premature generalized atrophy.  Caffeine:  2-3 drinks per day Alcohol:  no Smoker:  yes Diet:  Not balanced.  Does not keep hydrated Exercise:  no Depression/stress:  no Sleep hygiene:  Wakes up often to go to bathroom but falls right back to sleep He has personal history of migraine as well, also right sided but associated with photophobia and phonophobia.  They occur  infrequently. 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 Family history of aneurysm.  PAST MEDICAL HISTORY: Past Medical History  Diagnosis Date  . Chronic constipation   . GERD (gastroesophageal reflux disease)   . Headache     PAST SURGICAL HISTORY: Past Surgical History  Procedure Laterality Date  . Colonoscopy  08    NUR  . Appendectomy    . Head trauma      from trauma  . Upper gastrointestinal endoscopy      MEDICATIONS: Current Outpatient Prescriptions on File Prior to Visit  Medication Sig Dispense Refill  . albuterol (PROVENTIL) (2.5 MG/3ML) 0.083% nebulizer solution Take 2.5 mg by nebulization every 6 (six) hours as needed.      Marland Kitchen aspirin 325 MG tablet Take 325 mg by mouth daily.    . enalapril (VASOTEC) 10 MG tablet Take 10 mg by mouth daily.      . polyethylene glycol (MIRALAX / GLYCOLAX) packet TAKE 17 G BY MOUTH DAILY. 28 packet 0  . salmeterol (SEREVENT) 50 MCG/DOSE diskus inhaler Inhale 1 puff into the lungs 2 (two) times daily.      Marland Kitchen buPROPion (WELLBUTRIN XL) 150 MG 24 hr tablet Take 150 mg by mouth daily.       No current facility-administered medications on file prior to visit.    ALLERGIES: Allergies  Allergen Reactions  . Effexor [Venlafaxine Hydrochloride]     FAMILY HISTORY: Family History  Problem Relation Age of Onset  . Lung cancer Mother   . Emphysema Father   . Kidney cancer Sister   . Heart disease Brother   . Aneurysm Sister   .  Breast cancer Sister   . Colon cancer Maternal Aunt   . Colon cancer Maternal Uncle     x4    SOCIAL HISTORY: Social History   Social History  . Marital Status: Legally Separated    Spouse Name: N/A  . Number of Children: N/A  . Years of Education: N/A   Occupational History  . Not on file.   Social History Main Topics  . Smoking status: Current Every Day Smoker  . Smokeless tobacco: Former Neurosurgeon    Types: Chew    Quit date: 04/29/1987     Comment: 1 1/2 pack a day 35 yrs patient is aware he  needs to quit   . Alcohol Use: No  . Drug Use: No  . Sexual Activity: No   Other Topics Concern  . Not on file   Social History Narrative    REVIEW OF SYSTEMS: Constitutional: No fevers, chills, or sweats, no generalized fatigue, change in appetite Eyes: No visual changes, double vision, eye pain Ear, nose and throat: No hearing loss, ear pain, nasal congestion, sore throat Cardiovascular: No chest pain, palpitations Respiratory:  No shortness of breath at rest or with exertion, wheezes GastrointestinaI: No nausea, vomiting, diarrhea, abdominal pain, fecal incontinence Genitourinary:  No dysuria, urinary retention or frequency Musculoskeletal:  No neck pain, back pain Integumentary: No rash, pruritus, skin lesions Neurological: as above Psychiatric: No depression, insomnia, anxiety Endocrine: No palpitations, fatigue, diaphoresis, mood swings, change in appetite, change in weight, increased thirst Hematologic/Lymphatic:  No anemia, purpura, petechiae. Allergic/Immunologic: no itchy/runny eyes, nasal congestion, recent allergic reactions, rashes  PHYSICAL EXAM: Filed Vitals:   03/20/15 1004  BP: 140/82  Pulse: 72  Resp: 20   General: No acute distress.  Patient appears well-groomed.  Head:  Normocephalic/atraumatic Eyes:  fundi unremarkable, without vessel changes, exudates, hemorrhages or papilledema. Neck: supple, no paraspinal tenderness, full range of motion Back: No paraspinal tenderness Heart: regular rate and rhythm Lungs: Clear to auscultation bilaterally. Vascular: No carotid bruits. Neurological Exam: Mental status: alert and oriented to person, place, and time, recent and remote memory intact, fund of knowledge intact, attention and concentration intact, speech fluent and not dysarthric, language intact. Cranial nerves: CN I: not tested CN II: pupils equal, round and reactive to light, visual fields intact, fundi unremarkable, without vessel changes, exudates,  hemorrhages or papilledema. CN III, IV, VI:  full range of motion, no nystagmus, no ptosis CN V: facial sensation intact CN VII: upper and lower face symmetric CN VIII: hearing intact CN IX, X: gag intact, uvula midline CN XI: sternocleidomastoid and trapezius muscles intact CN XII: tongue midline Bulk & Tone: normal, no fasciculations. Motor:  5/5 throughout Sensation:  Temperature and vibration intact Deep Tendon Reflexes:  2+ throughout, toes downgoing. Finger to nose testing:  intact Heel to shin:  intact Gait:  Normal station and stride.  Able to turn and walk in tandem. Romberg negative.  IMPRESSION: 1.  Chronic daily headache, unilateral.  Possibly migraine-related although not his typical migraines.  Severity does not suggest tension-type, although he appears comfortable despite saying pain intensity is 10/10.  He exhibits no autonomic symptoms that would be seen with hemicrania continua.  Given his age, will check Sed Rate to rule out temporal arteritis. 2.  Tobacco abuse  PLAN: 1.  Start topiramate, titrating to 50mg  at bedtime. 2.  Prednisone taper 3.  Tramadol 50mg  as needed, limited to no more than 2 days out of the week.  Stop ibuprofen.  Given  history of stroke, triptans are contraindicated. 4.  Lifestyle modification discussed, including smoking cessation 5.  Call in 4 weeks with update and follow up in 3 months. 6.  Check Sed Rate 7.  Advised to get an eye exam.  Thank you for allowing me to take part in the care of this patient.  Shon Millet, DO  CC:  Terie Purser, PA-C  Elfredia Nevins, MD

## 2015-03-20 NOTE — Patient Instructions (Signed)
Migraine Recommendations: 1.  Start topiramate  tablet.  Take 1/2 tablet at bedtime for 7 days, then increase to 1 full tablet at bedtime.  Call in 4 weeks with update and we can adjust dose if needed. 2.  Take tramadol  at earliest onset of headache.  May take every 6 hours.  Do not take more than 2 days out of the week.  Stop ibuprofen. 3.  Limit use of pain relievers to no more than 2 days out of the week.  These medications include acetaminophen, ibuprofen, triptans and narcotics.  This will help reduce risk of rebound headaches. 4.  Be aware of common food triggers such as processed sweets, processed foods with nitrites (such as deli meat, hot dogs, sausages), foods with MSG, alcohol (such as wine), chocolate, certain cheeses, certain fruits (dried fruits, some citrus fruit), vinegar, diet soda. 4.  Avoid caffeine 5.  Routine exercise 6.  Proper sleep hygiene 7.  Stay adequately hydrated with water 8.  Keep a headache diary. 9.  Maintain proper stress management. 10.  Do not skip meals. 11.  We will start a prednisone taper to break cycle of daily headache.  Take 6tabs at once x1day, then 5tabs x1day, then 4tabs x1day, then 3tabs x1day, then 2tabs x1day, then 1tab x1day, then STOP. 12.  Check sed rate. 13.  CALL IN 4 WEEKS WITH UPDATE.  Follow up in 3 months.

## 2015-04-15 ENCOUNTER — Other Ambulatory Visit: Payer: Self-pay | Admitting: Neurology

## 2015-06-20 ENCOUNTER — Ambulatory Visit: Payer: BC Managed Care – PPO | Admitting: Neurology

## 2016-03-19 ENCOUNTER — Other Ambulatory Visit (HOSPITAL_COMMUNITY): Payer: Self-pay | Admitting: Registered Nurse

## 2016-03-19 DIAGNOSIS — R1031 Right lower quadrant pain: Secondary | ICD-10-CM

## 2016-03-19 DIAGNOSIS — R1011 Right upper quadrant pain: Secondary | ICD-10-CM

## 2016-03-20 ENCOUNTER — Ambulatory Visit (HOSPITAL_COMMUNITY)
Admission: RE | Admit: 2016-03-20 | Discharge: 2016-03-20 | Disposition: A | Discharge status: Home / Self Care | Payer: BC Managed Care – PPO | Source: Ambulatory Visit | Attending: Registered Nurse | Admitting: Registered Nurse

## 2016-03-20 DIAGNOSIS — R1011 Right upper quadrant pain: Secondary | ICD-10-CM | POA: Diagnosis present

## 2016-03-20 DIAGNOSIS — R1031 Right lower quadrant pain: Secondary | ICD-10-CM | POA: Diagnosis not present

## 2016-08-08 ENCOUNTER — Encounter (INDEPENDENT_AMBULATORY_CARE_PROVIDER_SITE_OTHER): Payer: Self-pay | Admitting: Internal Medicine

## 2016-08-08 ENCOUNTER — Encounter (INDEPENDENT_AMBULATORY_CARE_PROVIDER_SITE_OTHER): Payer: Self-pay

## 2016-09-02 ENCOUNTER — Ambulatory Visit (INDEPENDENT_AMBULATORY_CARE_PROVIDER_SITE_OTHER): Payer: BC Managed Care – PPO | Admitting: Internal Medicine

## 2016-09-06 ENCOUNTER — Encounter: Payer: Self-pay | Admitting: Internal Medicine

## 2017-07-29 ENCOUNTER — Emergency Department (HOSPITAL_COMMUNITY)
Admission: EM | Admit: 2017-07-29 | Discharge: 2017-07-30 | Disposition: A | Discharge status: Home / Self Care | Payer: BC Managed Care – PPO | Attending: Emergency Medicine | Admitting: Emergency Medicine

## 2017-07-29 ENCOUNTER — Encounter (HOSPITAL_COMMUNITY): Payer: Self-pay | Admitting: Emergency Medicine

## 2017-07-29 DIAGNOSIS — Z79899 Other long term (current) drug therapy: Secondary | ICD-10-CM | POA: Diagnosis not present

## 2017-07-29 DIAGNOSIS — R45851 Suicidal ideations: Secondary | ICD-10-CM | POA: Insufficient documentation

## 2017-07-29 DIAGNOSIS — Z046 Encounter for general psychiatric examination, requested by authority: Secondary | ICD-10-CM | POA: Diagnosis present

## 2017-07-29 DIAGNOSIS — F32A Depression, unspecified: Secondary | ICD-10-CM

## 2017-07-29 DIAGNOSIS — I10 Essential (primary) hypertension: Secondary | ICD-10-CM | POA: Insufficient documentation

## 2017-07-29 DIAGNOSIS — F17200 Nicotine dependence, unspecified, uncomplicated: Secondary | ICD-10-CM | POA: Diagnosis not present

## 2017-07-29 DIAGNOSIS — F329 Major depressive disorder, single episode, unspecified: Secondary | ICD-10-CM | POA: Diagnosis not present

## 2017-07-29 HISTORY — DX: Depression, unspecified: F32.A

## 2017-07-29 HISTORY — DX: Major depressive disorder, single episode, unspecified: F32.9

## 2017-07-29 LAB — COMPREHENSIVE METABOLIC PANEL WITH GFR
ALT: 5 U/L — ABNORMAL LOW (ref 17–63)
AST: 19 U/L (ref 15–41)
Albumin: 4.3 g/dL (ref 3.5–5.0)
Alkaline Phosphatase: 76 U/L (ref 38–126)
Anion gap: 12 (ref 5–15)
BUN: 14 mg/dL (ref 6–20)
CO2: 26 mmol/L (ref 22–32)
Calcium: 10.2 mg/dL (ref 8.9–10.3)
Chloride: 102 mmol/L (ref 101–111)
Creatinine, Ser: 0.82 mg/dL (ref 0.61–1.24)
GFR calc Af Amer: >60 mL/min (ref >60)
GFR calc non Af Amer: >60 mL/min (ref >60)
Glucose, Bld: 94 mg/dL (ref 65–99)
Potassium: 4.2 mmol/L (ref 3.5–5.1)
Sodium: 140 mmol/L (ref 135–145)
Total Bilirubin: 0.5 mg/dL (ref 0.3–1.2)
Total Protein: 8.4 g/dL — ABNORMAL HIGH (ref 6.5–8.1)

## 2017-07-29 LAB — RAPID URINE DRUG SCREEN, HOSP PERFORMED
Amphetamines: NOT DETECTED
Barbiturates: NOT DETECTED
Benzodiazepines: NOT DETECTED
Cocaine: NOT DETECTED
Opiates: NOT DETECTED
Tetrahydrocannabinol: NOT DETECTED

## 2017-07-29 LAB — CBC WITH DIFFERENTIAL/PLATELET
Basophils Absolute: 0.1 K/uL (ref 0.0–0.1)
Basophils Relative: 2 %
Eosinophils Absolute: 0.3 K/uL (ref 0.0–0.7)
Eosinophils Relative: 4 %
HCT: 47.3 % (ref 39.0–52.0)
Hemoglobin: 16.5 g/dL (ref 13.0–17.0)
Lymphocytes Relative: 34 %
Lymphs Abs: 3.1 K/uL (ref 0.7–4.0)
MCH: 35 pg — ABNORMAL HIGH (ref 26.0–34.0)
MCHC: 34.9 g/dL (ref 30.0–36.0)
MCV: 100.4 fL — ABNORMAL HIGH (ref 78.0–100.0)
Monocytes Absolute: 1.1 K/uL — ABNORMAL HIGH (ref 0.1–1.0)
Monocytes Relative: 12 %
Neutro Abs: 4.5 K/uL (ref 1.7–7.7)
Neutrophils Relative %: 48 %
Platelets: 287 K/uL (ref 150–400)
RBC: 4.71 MIL/uL (ref 4.22–5.81)
RDW: 13.7 % (ref 11.5–15.5)
WBC: 9.1 K/uL (ref 4.0–10.5)

## 2017-07-29 LAB — URINALYSIS, ROUTINE W REFLEX MICROSCOPIC
BACTERIA UA: NONE SEEN
Bilirubin Urine: NEGATIVE
Glucose, UA: NEGATIVE mg/dL
Ketones, ur: NEGATIVE mg/dL
Leukocytes, UA: NEGATIVE
Nitrite: NEGATIVE
PROTEIN: NEGATIVE mg/dL
SQUAMOUS EPITHELIAL / LPF: NONE SEEN
Specific Gravity, Urine: 1.003 — ABNORMAL LOW (ref 1.005–1.030)
pH: 6 (ref 5.0–8.0)

## 2017-07-29 LAB — ETHANOL: ALCOHOL ETHYL (B): 187 mg/dL — AB (ref <10)

## 2017-07-29 MED ORDER — LORAZEPAM 2 MG/ML IJ SOLN: 0.0000 mg | Freq: Two times a day (BID) | INTRAMUSCULAR | Status: DC

## 2017-07-29 MED ORDER — LORAZEPAM 1 MG PO TABS: 0.0000 mg | ORAL_TABLET | Freq: Two times a day (BID) | ORAL | Status: DC

## 2017-07-29 MED ORDER — ARFORMOTEROL TARTRATE 15 MCG/2ML IN NEBU
15.0000 ug | INHALATION_SOLUTION | Freq: Two times a day (BID) | RESPIRATORY_TRACT | Status: DC
  Administered 2017-07-30: 15 ug via RESPIRATORY_TRACT
  Filled 2017-07-29 (×5): qty 2

## 2017-07-29 MED ORDER — VITAMIN B-1 100 MG PO TABS
100.0000 mg | ORAL_TABLET | Freq: Every day | ORAL | Status: DC
  Administered 2017-07-30: 100 mg via ORAL
  Filled 2017-07-29: qty 1

## 2017-07-29 MED ORDER — ALBUTEROL SULFATE (2.5 MG/3ML) 0.083% IN NEBU: 2.5000 mg | INHALATION_SOLUTION | Freq: Four times a day (QID) | RESPIRATORY_TRACT | Status: DC | PRN

## 2017-07-29 MED ORDER — ASPIRIN 325 MG PO TABS
325.0000 mg | ORAL_TABLET | Freq: Every day | ORAL | Status: DC
  Administered 2017-07-30: 325 mg via ORAL
  Filled 2017-07-29: qty 1

## 2017-07-29 MED ORDER — NICOTINE 21 MG/24HR TD PT24
21.0000 mg | MEDICATED_PATCH | Freq: Every day | TRANSDERMAL | Status: DC
  Filled 2017-07-29: qty 1

## 2017-07-29 MED ORDER — ZOLPIDEM TARTRATE 5 MG PO TABS: 5.0000 mg | ORAL_TABLET | Freq: Every evening | ORAL | Status: DC | PRN

## 2017-07-29 MED ORDER — IBUPROFEN 400 MG PO TABS: 600.0000 mg | ORAL_TABLET | Freq: Three times a day (TID) | ORAL | Status: DC | PRN

## 2017-07-29 MED ORDER — LORAZEPAM 1 MG PO TABS: 0.0000 mg | ORAL_TABLET | Freq: Four times a day (QID) | ORAL | Status: DC

## 2017-07-29 MED ORDER — TOPIRAMATE 25 MG PO TABS
50.0000 mg | ORAL_TABLET | Freq: Every day | ORAL | Status: DC
  Administered 2017-07-30: 50 mg via ORAL
  Filled 2017-07-29 (×3): qty 2

## 2017-07-29 MED ORDER — ONDANSETRON HCL 4 MG PO TABS: 4.0000 mg | ORAL_TABLET | Freq: Three times a day (TID) | ORAL | Status: DC | PRN

## 2017-07-29 MED ORDER — THIAMINE HCL 100 MG/ML IJ SOLN: 100.0000 mg | Freq: Every day | INTRAMUSCULAR | Status: DC

## 2017-07-29 MED ORDER — LORAZEPAM 2 MG/ML IJ SOLN: 0.0000 mg | Freq: Four times a day (QID) | INTRAMUSCULAR | Status: DC

## 2017-07-29 MED ORDER — BUPROPION HCL ER (XL) 150 MG PO TB24
150.0000 mg | ORAL_TABLET | Freq: Every day | ORAL | Status: DC
  Filled 2017-07-29 (×3): qty 1

## 2017-07-29 MED ORDER — ENALAPRIL MALEATE 5 MG PO TABS
10.0000 mg | ORAL_TABLET | Freq: Every day | ORAL | Status: DC
Start: 2017-07-30 — End: 2017-07-30
  Administered 2017-07-30: 10 mg via ORAL
  Filled 2017-07-29: qty 2
  Filled 2017-07-29: qty 1
  Filled 2017-07-29: qty 2

## 2017-07-29 MED ORDER — ALUM & MAG HYDROXIDE-SIMETH 200-200-20 MG/5ML PO SUSP: 30.0000 mL | Freq: Four times a day (QID) | ORAL | Status: DC | PRN

## 2017-07-29 NOTE — ED Notes (Signed)
Pt is not IVC'd at this time, [facility] brought pt in under emergency commitment. Pt has been wanded by security.

## 2017-07-29 NOTE — ED Triage Notes (Signed)
Per [facility] they received a call that pt was going to kill himself by jumping out from behind a tree. Pt was found hiding behind a tree. Denies SI/HI, AVH, or drug use. States he has drank 1/2 a fifth of liquor today.

## 2017-07-29 NOTE — ED Provider Notes (Signed)
[facility] EMERGENCY DEPARTMENT Provider Note   CSN: [id number] Arrival date & time: 07/29/17  2054     History   Chief Complaint Chief Complaint  Patient presents with  . V70.1    HPI Isaac HEINKEL is a 56 y.o. male with a history of depression, GERD, HTN, and COPD who arrives to the ED via police for medical clearance today.  Per police received phone call from patient's friend that patient was texting thoughts of suicide with plan to hide behind a tree and jump out in front of a car to kill himself.  Police found patient hiding behind a tree near a road.  At present patient denies suicidal ideation, homicidal ideation, hallucinations, depression, anxiety or any other complaints at this time. States he wants to leave. When asked about the text messages patient states "that was nothing."  Patient does admit to alcohol use today, approximately half of a fifth of liquor.  Denies headache, chest pain, shortness of breath, abdominal pain, numbness, or weakness. Additionally patient reports he has not had any of his medications in 2 days because he forgot to take them this includes his anti-depressant and anti-hypertensive medicines.   HPI  Past Medical History:  Diagnosis Date  . Chronic constipation   . Depression   . GERD (gastroesophageal reflux disease)   . Headache     Patient Active Problem List   Diagnosis Date Noted  . Chronic daily headache 03/20/2015  . Constipation 04/28/2012  . Hx of adenomatous colonic polyps 04/28/2012  . COPD (chronic obstructive pulmonary disease) (HCC) 04/28/2012  . HTN (hypertension) 04/28/2012  . Smoking trying to quit 04/28/2012    Past Surgical History:  Procedure Laterality Date  . APPENDECTOMY    . COLONOSCOPY  08   NUR  . head trauma     from trauma  . UPPER GASTROINTESTINAL ENDOSCOPY         Home Medications    Prior to Admission medications   Medication Sig Start Date End Date Taking? Authorizing Provider  albuterol  (PROVENTIL) (2.5 MG/3ML) 0.083% nebulizer solution Take 2.5 mg by nebulization every 6 (six) hours as needed.      [provider]  aspirin 325 MG tablet Take 325 mg by mouth daily.    [provider]  buPROPion (WELLBUTRIN XL) 150 MG 24 hr tablet Take 150 mg by mouth daily.      [provider]  butalbital-acetaminophen-caffeine (FIORICET WITH CODEINE) 50-325-40-30 MG per capsule TAKE ONE CAPSULE BY MOUTH EVERY 6 HOURS AS NEEDED 10 DAYS 01/23/15   [provider]  enalapril (VASOTEC) 10 MG tablet Take 10 mg by mouth daily.      [provider]  polyethylene glycol (MIRALAX / GLYCOLAX) packet TAKE 17 G BY MOUTH DAILY. 02/20/15   Malissa Hippo, MD  predniSONE (DELTASONE) 10 MG tablet Take 6tabs x1day, then 5tabs x1day, then 4tabs x1day, then 3tabs x1day, then 2tabs x1day, then 1tab x1day, then STOP 03/20/15   Jaffe, Adam R, DO  salmeterol (SEREVENT) 50 MCG/DOSE diskus inhaler Inhale 1 puff into the lungs 2 (two) times daily.      [provider]  topiramate (TOPAMAX) 50 MG tablet TAKE 1/2 TAB AT BEDTIME FOR 7 DAYS, THEN 1 TAB AT BEDTIME 04/17/15   Jaffe, Adam R, DO  traMADol (ULTRAM) 50 MG tablet Take 1 tablet (50 mg total) by mouth every 8 (eight) hours as needed. 03/20/15   Drema Dallas, DO  VENTOLIN HFA 108 (90  BASE) MCG/ACT inhaler INHALE 2 PUFFS EVERY FOUR HOURS AS NEEDED 02/07/15   [provider]    Family History Family History  Problem Relation Age of Onset  . Lung cancer Mother   . Emphysema Father   . Kidney cancer Sister   . Heart disease Brother   . Aneurysm Sister   . Breast cancer Sister   . Colon cancer Maternal Aunt   . Colon cancer Maternal Uncle        x4    Social History Social History   Tobacco Use  . Smoking status: Current Every Day Smoker    Packs/day: 3.00  . Smokeless tobacco: Former Neurosurgeon    Types: Chew    Quit date: 04/29/1987  . Tobacco comment: 1 1/2 pack a day 34 yrs patient is aware he needs  to quit   Substance Use Topics  . Alcohol use: Yes    Alcohol/week: 0.0 oz    Comment: 1/2 a fifth of liquor  . Drug use: No     Allergies   Effexor [venlafaxine hydrochloride]   Review of Systems Review of Systems  Constitutional: Negative for chills and fever.  Eyes: Negative for visual disturbance.  Respiratory: Negative for shortness of breath.   Cardiovascular: Negative for chest pain.  Gastrointestinal: Negative for abdominal pain, diarrhea, nausea and vomiting.  Neurological: Negative for headaches.  Psychiatric/Behavioral: Negative for hallucinations. The patient is not nervous/anxious.        Patient denies SI, police report patient was texting suicidal thoughts with plan.   All other systems reviewed and are negative.   Physical Exam Updated Vital Signs BP (!) 183/102 (BP Location: Left Arm)   Pulse 84   Temp 98 F (36.7 C) (Oral)   Resp 20   Ht 5\' 9"  (1.753 m)   Wt 70.8 kg (156 lb)   SpO2 100%   BMI 23.04 kg/m   Physical Exam  Constitutional: He is oriented to person, place, and time. He appears well-developed and well-nourished. No distress.  HENT:  Head: Normocephalic and atraumatic.  Eyes: Conjunctivae are normal. Right eye exhibits no discharge. Left eye exhibits no discharge.  Cardiovascular: Normal rate and regular rhythm.  No murmur heard. Pulmonary/Chest: Breath sounds normal. No respiratory distress. He has no wheezes. He has no rales.  Abdominal: Soft. He exhibits no distension. There is no tenderness.  Neurological: He is alert and oriented to person, place, and time.  Clear speech.   Skin: Skin is warm and dry. No rash noted.  Psychiatric: He has a normal mood and affect. His speech is normal and behavior is normal. He expresses no homicidal ideation. He expresses no homicidal plans.  Patient denies SI with plan, Per police SI with plan via text message.   Nursing note and vitals reviewed.   ED Treatments / Results  Labs Results for  orders placed or performed during the hospital encounter of 07/29/17  CBC with Differential  Result Value Ref Range   WBC 9.1 4.0 - 10.5 K/uL   RBC 4.71 4.22 - 5.81 MIL/uL   Hemoglobin 16.5 13.0 - 17.0 g/dL   HCT 65.7 84.6 - 96.2 %   MCV 100.4 (H) 78.0 - 100.0 fL   MCH 35.0 (H) 26.0 - 34.0 pg   MCHC 34.9 30.0 - 36.0 g/dL   RDW 95.2 84.1 - 32.4 %   Platelets 287 150 - 400 K/uL   Neutrophils Relative % 48 %   Neutro Abs 4.5 1.7 - 7.7 K/uL  Lymphocytes Relative 34 %   Lymphs Abs 3.1 0.7 - 4.0 K/uL   Monocytes Relative 12 %   Monocytes Absolute 1.1 (H) 0.1 - 1.0 K/uL   Eosinophils Relative 4 %   Eosinophils Absolute 0.3 0.0 - 0.7 K/uL   Basophils Relative 2 %   Basophils Absolute 0.1 0.0 - 0.1 K/uL  Comprehensive metabolic panel  Result Value Ref Range   Sodium 140 135 - 145 mmol/L   Potassium 4.2 3.5 - 5.1 mmol/L   Chloride 102 101 - 111 mmol/L   CO2 26 22 - 32 mmol/L   Glucose, Bld 94 65 - 99 mg/dL   BUN 14 6 - 20 mg/dL   Creatinine, Ser 1.610.82 0.61 - 1.24 mg/dL   Calcium 09.610.2 8.9 - 04.510.3 mg/dL   Total Protein 8.4 (H) 6.5 - 8.1 g/dL   Albumin 4.3 3.5 - 5.0 g/dL   AST 19 15 - 41 U/L   ALT 5 (L) 17 - 63 U/L   Alkaline Phosphatase 76 38 - 126 U/L   Total Bilirubin 0.5 0.3 - 1.2 mg/dL   GFR calc non Af Amer >60 >60 mL/min   GFR calc Af Amer >60 >60 mL/min   Anion gap 12 5 - 15  Ethanol  Result Value Ref Range   Alcohol, Ethyl (B) 187 (H) <10 mg/dL  Rapid urine drug screen (hospital performed)  Result Value Ref Range   Opiates NONE DETECTED NONE DETECTED   Cocaine NONE DETECTED NONE DETECTED   Benzodiazepines NONE DETECTED NONE DETECTED   Amphetamines NONE DETECTED NONE DETECTED   Tetrahydrocannabinol NONE DETECTED NONE DETECTED   Barbiturates NONE DETECTED NONE DETECTED  Urinalysis, Routine w reflex microscopic  Result Value Ref Range   Color, Urine COLORLESS (A) YELLOW   APPearance CLEAR CLEAR   Specific Gravity, Urine 1.003 (L) 1.005 - 1.030   pH 6.0 5.0 - 8.0    Glucose, UA NEGATIVE NEGATIVE mg/dL   Hgb urine dipstick MODERATE (A) NEGATIVE   Bilirubin Urine NEGATIVE NEGATIVE   Ketones, ur NEGATIVE NEGATIVE mg/dL   Protein, ur NEGATIVE NEGATIVE mg/dL   Nitrite NEGATIVE NEGATIVE   Leukocytes, UA NEGATIVE NEGATIVE   RBC / HPF 0-5 0 - 5 RBC/hpf   WBC, UA 0-5 0 - 5 WBC/hpf   Bacteria, UA NONE SEEN NONE SEEN   Squamous Epithelial / LPF NONE SEEN NONE SEEN   No results found. EKG  EKG Interpretation None       Radiology No results found.  Procedures Procedures (including critical care time)  Medications Ordered in ED Medications - No data to display   Initial Impression / Assessment and Plan / ED Course  I have reviewed the triage vital signs and the nursing notes.  Pertinent labs & imaging results that were available during my care of the patient were reviewed by me and considered in my medical decision making (see chart for details).   Patient presents via police due to concerns for suicidal ideation with plan for medical clearance. Patient was found on side of road behind a tree by police, reported plan to police by patient's friend was to hide behind a tree and jump out in front of a car. Patient requesting to leave, admits to alcohol use. IVC paperwork completed. Patient is nontoxic appearing and in no apparent distress. Blood pressure elevated in setting of not having taken prescribed anti-hypertensive in past 2 days. Patient denies headache, chest pain, dyspnea, numbness, weakness, or change in vision. Screening labs revealed EtOH  level of 187, otherwise grossly unremarkable. Isaac order home meds including anti-hypertensive med.   Patient is medically cleared for behavioral health evaluation, TTS consult placed.   Findings and plan of care discussed with supervising physician Dr. Deretha EmoryZackowski who personally evaluated and examined this patient.   Final Clinical Impressions(s) / ED Diagnoses   Final diagnoses:  Essential hypertension      ED Discharge Orders    None       Cherly Andersonetrucelli, Peachie Barkalow R, PA-C 07/29/17 2258    Vanetta MuldersZackowski, Scott, MD 07/29/17 2308

## 2017-07-29 NOTE — BH Assessment (Addendum)
Tele Assessment Note   Patient Name: Isaac Rose MRN: [id number] Referring Physician: Harvie HeckSamantha Petrucelli, PA-C [facility] Location of Provider: Behavioral Health TTS Department  Isaac Rose is an 56 y.o. divorced male who presents accompanied by law enforcement. Law enforcement reported to [facility] staff they received a call from Pt's friend that Pt was texting suicidal thoughts with a plan to jump out from behind a tree in front of a car to kill himself. Pt was found by law enforcement hiding behind a tree and bought to [facility] where he was placed under IVC by Dr. Vanetta MuldersScott Zackowski.   Pt reports he was alone on Christmas and felt lonely and depressed. He states "I drank a few too many", estimating he drank one half of a fifth of liquor. He acknowledges that he sent texts to his stepson saying he was suicidal with plan to hide behind a tree and jump in front of a car. Pt denies current suicidal ideation and says he regrets sending those texts. Pt denies any history of suicide attempts or intentional self-injurious behavior. Protective factors against suicide include no history of suicide and no prior attempts. He denies current homicidal ideation of history of violence. He denies any history of psychotic symptoms.  Pt reports drinking 3-4 mixed drinks approximately four times per week. He states he drinks more heavily on weekends. He denies any other substance use. Pt's blood alcohol is 187 and urine drug screen is negative. Pt reports he was arrested for a DWI over thirty years ago.  Pt identifies being alone on Christmas as his primary stressor. He describes the holiday season as being "very hard." Pt is divorced, lives alone and has no biological children. He identifies his stepson as his primary support. Pt says he also called his brother and sister today to wish them merry Christmas. 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 Pt denies any current legal  problems. He denies any history of abuse or trauma.  Pt reports he is prescribed Citalopram by his primary care physician, Dr. Yisroel RammingSamantha Jo Jackson. He says he has missed taking his medication for the past two days. Pt reports he is supposed to use a C-pap machine but has not used on in two years because it is broken. Pt denies having ever seen a psychiatrist or therapist. He denies any history of inpatient mental health or substance abuse treatment.  Pt is dressed in hospital scrubs, alert and oriented x4. Pt speaks in a clear tone, at moderate volume and normal pace. Motor behavior appears normal. Eye contact is good. Pt's mood is guilty and affect is congruent with mood. Thought process is coherent and relevant. There is no indication Pt is currently responding to internal stimuli or experiencing delusional thought content. Pt states he wants to go home.   Diagnosis: Major Depressive Disorder, Recurrent, Severe Without Psychotic Features; Alcohol Use Disorder, Moderate  Past Medical History:  Past Medical History:  Diagnosis Date  . Chronic constipation   . Depression   . GERD (gastroesophageal reflux disease)   . Headache     Past Surgical History:  Procedure Laterality Date  . APPENDECTOMY    . COLONOSCOPY  08   NUR  . head trauma     from trauma  . UPPER GASTROINTESTINAL ENDOSCOPY      Family History:  Family History  Problem Relation Age of Onset  . Lung cancer Mother   . Emphysema Father   .  Kidney cancer Sister   . Heart disease Brother   . Aneurysm Sister   . Breast cancer Sister   . Colon cancer Maternal Aunt   . Colon cancer Maternal Uncle        x4    Social History:  reports that he has been smoking.  He has been smoking about 3.00 packs per day. He quit smokeless tobacco use about 30 years ago. His smokeless tobacco use included chew. He reports that he drinks alcohol. He reports that he does not use drugs.  Additional Social History:  Alcohol / Drug  Use Pain Medications: See MAR Prescriptions: See MAR Over the Counter: See MAR History of alcohol / drug use?: Yes Longest period of sobriety (when/how long): Unknown Negative Consequences of Use: Personal relationships, Legal Withdrawal Symptoms: (Pt denies) Substance #1 Name of Substance 1: Alcohol 1 - Age of First Use: Adolescent 1 - Amount (size/oz): 3-4 mixed drinks 1 - Frequency: Approximately 4 days per week 1 - Duration: Ongoing 1 - Last Use / Amount: 07/29/17, 1/2 a fifth of liquor  CIWA: CIWA-Ar BP: (!) 183/102 Pulse Rate: 84 COWS:    PATIENT STRENGTHS: (choose at least two) Ability for insight Average or above average intelligence Capable of independent living Communication skills Financial means General fund of knowledge Physical Health Work skills  Allergies:  Allergies  Allergen Reactions  . Effexor [Venlafaxine Hydrochloride]     Home Medications:  (Not in a hospital admission)  OB/GYN Status:  No LMP for male patient.  General Assessment Data Location of Assessment: [facility] TTS Assessment: In system Is this a Tele or Face-to-Face Assessment?: Tele Assessment Is this an Initial Assessment or a Re-assessment for this encounter?: Initial Assessment Marital status: Divorced [place]iden name: NA Is patient pregnant?: No Pregnancy Status: No Living Arrangements: Alone Can pt return to current living arrangement?: Yes Admission Status: Involuntary Is patient capable of signing voluntary admission?: Yes Referral Source: Other(Law enforcement) Insurance type: [employer]     Crisis Care Plan Living Arrangements: Alone Legal Guardian: Other:(Self) Name of Psychiatrist: None Name of Therapist: None  Education Status Is patient currently in school?: No Current Grade: NA Highest grade of school patient has completed: 12 Name of school: NA Contact person: NA  Risk to self with the past 6 months Suicidal Ideation: Yes-Currently Present Has patient been a  risk to self within the past 6 months prior to admission? : Yes Suicidal Intent: No Has patient had any suicidal intent within the past 6 months prior to admission? : Yes Is patient at risk for suicide?: Yes Suicidal Plan?: Yes-Currently Present Has patient had any suicidal plan within the past 6 months prior to admission? : Yes Specify Current Suicidal Plan: Jump in front of a car Access to Means: Yes Specify Access to Suicidal Means: Pt was found near a road What has been your use of drugs/alcohol within the last 12 months?: Pt abuses alcohol Previous Attempts/Gestures: No How many times?: 0 Other Self Harm Risks: None Triggers for Past Attempts: None known Intentional Self Injurious Behavior: None Family Suicide History: No Recent stressful life event(s): Other (Comment)(Holidays) Persecutory voices/beliefs?: No Depression: Yes Depression Symptoms: Despondent, Loss of interest in usual pleasures, Feeling worthless/self pity Substance abuse history and/or treatment for substance abuse?: Yes Suicide prevention information given to non-admitted patients: Not applicable  Risk to Others within the past 6 months Homicidal Ideation: No Does patient have any lifetime risk of violence toward others beyond the six months prior to admission? :  No Thoughts of Harm to Others: No Current Homicidal Intent: No Current Homicidal Plan: No Access to Homicidal Means: No Identified Victim: None History of harm to others?: No Assessment of Violence: None Noted Violent Behavior Description: Pt denies history of violence Does patient have access to weapons?: No Criminal Charges Pending?: No Does patient have a court date: No Is patient on probation?: No  Psychosis Hallucinations: None noted Delusions: None noted  Mental Status Report Appearance/Hygiene: In scrubs Eye Contact: Good Motor Activity: Unremarkable Speech: Logical/coherent Level of Consciousness: Alert, Other  (Comment)(Intoxicated) Mood: Guilty Affect: Appropriate to circumstance Anxiety Level: None Thought Processes: Coherent, Relevant Judgement: Impaired Orientation: Person, Place, Time, Situation, Appropriate for developmental age Obsessive Compulsive Thoughts/Behaviors: None  Cognitive Functioning Concentration: Normal Memory: Recent Intact, Remote Intact IQ: Average Insight: Fair Impulse Control: Fair Appetite: Good Weight Loss: 0 Weight Gain: 0 Sleep: Decreased Total Hours of Sleep: 5 Vegetative Symptoms: None  ADLScreening [facility] Assessment Services) Patient's cognitive ability adequate to safely complete daily activities?: Yes Patient able to express need for assistance with ADLs?: Yes Independently performs ADLs?: Yes (appropriate for developmental age)  Prior Inpatient Therapy Prior Inpatient Therapy: No Prior Therapy Dates: NA Prior Therapy Facilty/Provider(s): NA Reason for Treatment: NA  Prior Outpatient Therapy Prior Outpatient Therapy: No Prior Therapy Dates: NA Prior Therapy Facilty/Provider(s): NA Reason for Treatment: NA Does patient have an ACCT team?: No Does patient have Intensive In-House Services?  : No Does patient have [facility] services? : No Does patient have P4CC services?: No  ADL Screening (condition at time of admission) Patient's cognitive ability adequate to safely complete daily activities?: Yes Is the patient deaf or have difficulty hearing?: No Does the patient have difficulty seeing, even when wearing glasses/contacts?: No Does the patient have difficulty concentrating, remembering, or making decisions?: No Patient able to express need for assistance with ADLs?: Yes Does the patient have difficulty dressing or bathing?: No Independently performs ADLs?: Yes (appropriate for developmental age) Does the patient have difficulty walking or climbing stairs?: No Weakness of Legs: None Weakness of Arms/Hands: None  Home Assistive  Devices/Equipment Home Assistive Devices/Equipment: None    Abuse/Neglect Assessment (Assessment to be complete while patient is alone) Abuse/Neglect Assessment Can Be Completed: Yes Physical Abuse: Denies Verbal Abuse: Denies Sexual Abuse: Denies Exploitation of patient/patient's resources: Denies Self-Neglect: Denies     Merchant navy officer (For Healthcare) Does Patient Have a Medical Advance Directive?: No Would patient like information on creating a medical advance directive?: No - Patient declined    Additional Information 1:1 In Past 12 Months?: No CIRT Risk: No Elopement Risk: No Does patient have medical clearance?: Yes     Disposition: Gave clinical report to Donell Sievert, PA who recommended Pt be observed overnight for safety and stabilization due to intoxication and evaluated by psychiatry in the morning. Notified Harvie Heck, PA-C and charge RN at [facility] of recommendation.  Disposition Disposition of Patient: Re-evaluation by Psychiatry recommended  This service was provided via telemedicine using a 2-way, interactive audio and video technology.  Names of all persons participating in this telemedicine service and their role in this encounter. Name: Tildon Husky Role: Patient  Name: Shela Commons, [place] Role: TTS counselor         Harlin Rain Patsy [place], [facility], [facility], [facility] Triage Specialist [phone]  Pamalee Leyden 07/29/2017 11:21 PM

## 2017-07-30 ENCOUNTER — Encounter (HOSPITAL_COMMUNITY): Payer: Self-pay | Admitting: Registered Nurse

## 2017-07-30 MED ORDER — LOSARTAN POTASSIUM 100 MG PO TABS: 100.0000 mg | ORAL_TABLET | Freq: Every day | ORAL | 0 refills | Status: DC

## 2017-07-30 NOTE — ED Provider Notes (Signed)
2:39 PM Assumed care from Dr. Deretha EmoryZackowski, please see their note for full history, physical and decision making until this point. In brief this is a 56 y.o. year old male who presented to the ED tonight with V4170.621     56 year old with history of alcoholism and depression presents the emergency department today after being intoxicated state new suicidal.  Patient is sober now and is denying suicidality.  Has been cleared by behavioral health.  Also some slightly elevated blood pressures but he has not been on his medications recently and plans to restart this.  He states he Artie has a prescription waiting at the pharmacy.  On my evaluation he still denies suicidality and is constipated quitting drinking alcohol.  No obvious paranoia or life threats.   Discharge instructions, including strict return precautions for new or worsening symptoms, given. Patient and/or family verbalized understanding and agreement with the plan as described.   Labs, studies and imaging reviewed by myself and considered in medical decision making if ordered. Imaging interpreted by radiology.  Labs Reviewed  CBC WITH DIFFERENTIAL/PLATELET - Abnormal; Notable for the following components:      Result Value   MCV 100.4 (*)    MCH 35.0 (*)    Monocytes Absolute 1.1 (*)    All other components within normal limits  COMPREHENSIVE METABOLIC PANEL - Abnormal; Notable for the following components:   Total Protein 8.4 (*)    ALT 5 (*)    All other components within normal limits  ETHANOL - Abnormal; Notable for the following components:   Alcohol, Ethyl (B) 187 (*)    All other components within normal limits  URINALYSIS, ROUTINE W REFLEX MICROSCOPIC - Abnormal; Notable for the following components:   Color, Urine COLORLESS (*)    Specific Gravity, Urine 1.003 (*)    Hgb urine dipstick MODERATE (*)    All other components within normal limits  RAPID URINE DRUG SCREEN, HOSP PERFORMED    No orders to display    No  Follow-up on file.    Marily MemosMesner, Rayli Wiederhold, MD 07/30/17 [phone]

## 2017-07-30 NOTE — Consult Note (Signed)
  Isaac HeroMichael W Revolorio, 56 y.o., male patient presented to [facility] via police after police was called by a friend of patient stating that the patient had sent text messages that he was going to jump from behind a tree in an attempt to kill himself.  Police found him standing behind a tree.  Patient seen via telepsych by this provider; chart reviewed and consulted with Dr. Lucianne MussKumar on 07/30/17.  On evaluation Isaac Rose reports  "I was intoxicated and a little depressed.  I did text my ex step son and tell him that I was going to kill myself; but I really wasn't.  He was worried about me and called the police.  It was christmas and I was home alone and feeling depressed at the time."  Patient states that he was standing in the yard when the police but he wasn't trying to kill himself.  At this time patient denies suicidal/homicidal/self-harm ideation, psychosis, and paranoia.  Patient states that he does have a history of depression and gets antidepressant from his primary physician.  No prior history of psychiatric hospitalization or outpatient services; and no prior history of suicide attempt.  Patient gave permission to speak with his ex step son Willaim BaneKevin Austin [phone]). Spoke with Willaim BaneKevin Austin (who called police) and he stated that he felt it was because Tildon HuskyMichael Rotondo had been drinking that he had made the statement that he was going to kill him self.  He states that he was concerned because patient was drunk and making statement that he was going to kill himself.  States that he feels that patient would be safe to go home and that he would check on him.    During evaluation Isaac HeroMichael W Corle is alert/oriented x 4; calm/cooperative with pleasant affect.  He does not appear to be responding to internal/external stimuli.  Patient denies suicidal/self-harm/homicidal ideation, psychosis, and paranoia.  Patient answered question appropriately.  Mr Eliberto Ivoryustin also feels that patient is safe to go home and that  patient's statements were made as a result of patient being intoxicated. Patient psychiatrically cleared.     Recommendations:  Resource information for psychiatric outpatient services  Disposition: No evidence of imminent risk to self or others at present.   Patient does not meet criteria for psychiatric inpatient admission. Discussed crisis plan, support from social network, calling 911, coming to the Emergency Department, and calling Suicide Hotline.

## 2017-07-30 NOTE — ED Notes (Signed)
[facility] called to state pt has been cleared, transferred call to Dr. Clayborne DanaMesner

## 2017-07-30 NOTE — ED Notes (Signed)
Pt in bed resting  

## 2017-07-30 NOTE — ED Notes (Signed)
Pt given lunch tray.

## 2017-07-30 NOTE — ED Notes (Signed)
Pt drank coffee from breakfast tray, pacing room waiting for TTS. Pt states he is ready to go home.

## 2017-07-30 NOTE — Progress Notes (Addendum)
Per Assunta FoundShuvon Rankin, NP, the patient does not meet criteria for inpatient treatment. The patient is recommended for discharge and to follow up with an outpatient provider.   The patient is psychiatrically cleared at this time. Patient seen via telepsych by this provider; chart reviewed and consulted with Dr. Lucianne MussKumar on 07/30/17. Collateral was also obtained by Disposition CSW.   The patient gave verbal consent for Disposition CSW to obtain collateral information from his step son Willaim BaneKevin Austin [phone]). Per Caryn BeeKevin, he believes his step dad "would be alright" if he was discharged, regarding the patient's previous suicidal ideation upon arrival to the hospital. The patient now denies any SI, HI or AVH.    Gearldine BienenstockBrandy, RN and Dr. Erin HearingMessner notified.    Baldo DaubJolan Amore Ackman, MSW, LCSWA Clinical Social Worker (Disposition) [facility]  [phone]

## 2018-03-02 ENCOUNTER — Encounter (HOSPITAL_COMMUNITY): Payer: Self-pay | Admitting: Emergency Medicine

## 2018-03-02 ENCOUNTER — Observation Stay (HOSPITAL_COMMUNITY)
Admission: EM | Admit: 2018-03-02 | Discharge: 2018-03-03 | Disposition: A | Discharge status: Home / Self Care | Payer: BC Managed Care – PPO | Attending: Internal Medicine | Admitting: Internal Medicine

## 2018-03-02 DIAGNOSIS — F17210 Nicotine dependence, cigarettes, uncomplicated: Secondary | ICD-10-CM | POA: Diagnosis not present

## 2018-03-02 DIAGNOSIS — K219 Gastro-esophageal reflux disease without esophagitis: Secondary | ICD-10-CM | POA: Insufficient documentation

## 2018-03-02 DIAGNOSIS — I10 Essential (primary) hypertension: Secondary | ICD-10-CM | POA: Insufficient documentation

## 2018-03-02 DIAGNOSIS — Z79899 Other long term (current) drug therapy: Secondary | ICD-10-CM | POA: Diagnosis not present

## 2018-03-02 DIAGNOSIS — J449 Chronic obstructive pulmonary disease, unspecified: Secondary | ICD-10-CM | POA: Diagnosis not present

## 2018-03-02 DIAGNOSIS — R519 Headache, unspecified: Secondary | ICD-10-CM | POA: Diagnosis present

## 2018-03-02 DIAGNOSIS — Z720 Tobacco use: Secondary | ICD-10-CM

## 2018-03-02 DIAGNOSIS — I160 Hypertensive urgency: Principal | ICD-10-CM | POA: Insufficient documentation

## 2018-03-02 DIAGNOSIS — R51 Headache: Secondary | ICD-10-CM | POA: Diagnosis not present

## 2018-03-02 DIAGNOSIS — E876 Hypokalemia: Secondary | ICD-10-CM | POA: Insufficient documentation

## 2018-03-02 DIAGNOSIS — F329 Major depressive disorder, single episode, unspecified: Secondary | ICD-10-CM | POA: Diagnosis not present

## 2018-03-02 HISTORY — DX: Essential (primary) hypertension: I10

## 2018-03-02 LAB — BASIC METABOLIC PANEL
Anion gap: 8 (ref 5–15)
BUN: 11 mg/dL (ref 6–20)
CHLORIDE: 103 mmol/L (ref 98–111)
CO2: 28 mmol/L (ref 22–32)
CREATININE: 0.87 mg/dL (ref 0.61–1.24)
Calcium: 9.2 mg/dL (ref 8.9–10.3)
GFR calc Af Amer: >60 mL/min (ref >60)
GFR calc non Af Amer: >60 mL/min (ref >60)
GLUCOSE: 85 mg/dL (ref 70–99)
Potassium: 3.1 mmol/L — ABNORMAL LOW (ref 3.5–5.1)
SODIUM: 139 mmol/L (ref 135–145)

## 2018-03-02 LAB — CBC
HEMATOCRIT: 42.1 % (ref 39.0–52.0)
HEMOGLOBIN: 14.7 g/dL (ref 13.0–17.0)
MCH: 36.2 pg — AB (ref 26.0–34.0)
MCHC: 34.9 g/dL (ref 30.0–36.0)
MCV: 103.7 fL — ABNORMAL HIGH (ref 78.0–100.0)
Platelets: 244 10*3/uL (ref 150–400)
RBC: 4.06 MIL/uL — ABNORMAL LOW (ref 4.22–5.81)
RDW: 13.5 % (ref 11.5–15.5)
WBC: 7 10*3/uL (ref 4.0–10.5)

## 2018-03-02 LAB — TROPONIN I: Troponin I: 0.03 ng/mL (ref <0.03)

## 2018-03-02 MED ORDER — METOPROLOL TARTRATE 50 MG PO TABS
50.0000 mg | ORAL_TABLET | Freq: Two times a day (BID) | ORAL | Status: DC
  Administered 2018-03-02: 50 mg via ORAL
  Filled 2018-03-02: qty 1

## 2018-03-02 MED ORDER — LABETALOL HCL 5 MG/ML IV SOLN
20.0000 mg | Freq: Once | INTRAVENOUS | Status: AC
  Administered 2018-03-02: 20 mg via INTRAVENOUS
  Filled 2018-03-02: qty 4

## 2018-03-02 MED ORDER — BUPROPION HCL ER (XL) 150 MG PO TB24
150.0000 mg | ORAL_TABLET | Freq: Every day | ORAL | Status: DC
  Administered 2018-03-03: 150 mg via ORAL
  Filled 2018-03-02: qty 1

## 2018-03-02 MED ORDER — PNEUMOCOCCAL VAC POLYVALENT 25 MCG/0.5ML IJ INJ: 0.5000 mL | INJECTION | INTRAMUSCULAR | Status: DC

## 2018-03-02 MED ORDER — SODIUM CHLORIDE 0.9 % IV SOLN: 250.0000 mL | INTRAVENOUS | Status: DC | PRN

## 2018-03-02 MED ORDER — SODIUM CHLORIDE 0.9% FLUSH: 3.0000 mL | INTRAVENOUS | Status: DC | PRN

## 2018-03-02 MED ORDER — NICOTINE 21 MG/24HR TD PT24
21.0000 mg | MEDICATED_PATCH | Freq: Every day | TRANSDERMAL | Status: DC
  Administered 2018-03-02 – 2018-03-03 (×2): 21 mg via TRANSDERMAL
  Filled 2018-03-02 (×2): qty 1

## 2018-03-02 MED ORDER — AMLODIPINE BESYLATE 5 MG PO TABS
10.0000 mg | ORAL_TABLET | Freq: Once | ORAL | Status: AC
  Administered 2018-03-02: 10 mg via ORAL
  Filled 2018-03-02: qty 2

## 2018-03-02 MED ORDER — LABETALOL HCL 5 MG/ML IV SOLN
20.0000 mg | INTRAVENOUS | Status: DC | PRN
  Filled 2018-03-02: qty 4

## 2018-03-02 MED ORDER — SODIUM CHLORIDE 0.9% FLUSH: 3.0000 mL | Freq: Two times a day (BID) | INTRAVENOUS | Status: DC

## 2018-03-02 MED ORDER — LOSARTAN POTASSIUM 50 MG PO TABS
100.0000 mg | ORAL_TABLET | Freq: Every day | ORAL | Status: DC
  Administered 2018-03-03: 100 mg via ORAL
  Filled 2018-03-02: qty 2

## 2018-03-02 MED ORDER — HYDRALAZINE HCL 20 MG/ML IJ SOLN
10.0000 mg | Freq: Once | INTRAMUSCULAR | Status: AC
  Administered 2018-03-02: 10 mg via INTRAVENOUS
  Filled 2018-03-02: qty 1

## 2018-03-02 MED ORDER — ALBUTEROL SULFATE (2.5 MG/3ML) 0.083% IN NEBU: 2.5000 mg | INHALATION_SOLUTION | Freq: Four times a day (QID) | RESPIRATORY_TRACT | Status: DC | PRN

## 2018-03-02 NOTE — ED Notes (Signed)
Date and time results received: 03/02/18 2055 (use smartphrase ".now" to insert current time)  Test: troponin Critical Value: 0.03  Name of Provider Notified: Dr. Lynelle DoctorKnapp notified at 2100  Orders Received? Or Actions Taken?: no/na

## 2018-03-02 NOTE — ED Provider Notes (Signed)
[facility] EMERGENCY DEPARTMENT Provider Note   CSN: [id number] Arrival date & time: 03/02/18  1727     History   Chief Complaint Chief Complaint  Patient presents with  . Hypertension    HPI Isaac Rose is a 57 y.o. male.  HPI Patient presents to the emergency room for evaluation of hypertension.  Patient states for the last month or so his blood pressures been running over 200 systolic.  He tried to call his doctor the end of last week to make an appointment about his blood pressure.  Patient told him about how high his blood pressure was and they suggested he go to the emergency room.  Patient states he has had a mild headache.  He occasionally has some mild discomfort in his chest.  He does smoke cigarettes and has some intermittent trouble with wheezing but denies having any chest pain right now or shortness of breath.  He denies any severe headache.  He denies any neck pain.  No vomiting or diarrhea. Past Medical History:  Diagnosis Date  . Chronic constipation   . Depression   . GERD (gastroesophageal reflux disease)   . Headache   . Hypertension     Patient Active Problem List   Diagnosis Date Noted  . Chronic daily headache 03/20/2015  . Constipation 04/28/2012  . Hx of adenomatous colonic polyps 04/28/2012  . COPD (chronic obstructive pulmonary disease) (HCC) 04/28/2012  . HTN (hypertension) 04/28/2012  . Smoking trying to quit 04/28/2012    Past Surgical History:  Procedure Laterality Date  . APPENDECTOMY    . COLONOSCOPY  08   NUR  . head trauma     from trauma  . UPPER GASTROINTESTINAL ENDOSCOPY          Home Medications    Prior to Admission medications   Medication Sig Start Date End Date Taking? Authorizing Provider  buPROPion (WELLBUTRIN XL) 150 MG 24 hr tablet Take 150 mg by mouth daily.   Yes [provider]  losartan (COZAAR) 100 MG tablet Take 1 tablet (100 mg total) by mouth daily. 07/30/17  Yes Mesner, Barbara Cower, MD    VENTOLIN HFA 108 (90 BASE) MCG/ACT inhaler INHALE 2 PUFFS EVERY FOUR HOURS AS NEEDED FOR SHORTNESS OF BREATH 02/07/15  Yes [provider]  albuterol (PROVENTIL) (2.5 MG/3ML) 0.083% nebulizer solution Take 2.5 mg by nebulization every 6 (six) hours as needed.      [provider]    Family History Family History  Problem Relation Age of Onset  . Lung cancer Mother   . Emphysema Father   . Kidney cancer Sister   . Heart disease Brother   . Aneurysm Sister   . Breast cancer Sister   . Colon cancer Maternal Aunt   . Colon cancer Maternal Uncle        x4    Social History Social History   Tobacco Use  . Smoking status: Current Every Day Smoker    Packs/day: 3.00  . Smokeless tobacco: Former Neurosurgeon    Types: Chew    Quit date: 04/29/1987  . Tobacco comment: 1 1/2 pack a day 34 yrs patient is aware he needs to quit   Substance Use Topics  . Alcohol use: Yes    Alcohol/week: 0.0 oz    Comment: 1/2 a fifth of liquor.. several mixed drinks  . Drug use: No     Allergies   Effexor [venlafaxine hydrochloride]   Review of Systems Review of Systems  All other systems reviewed and are negative.    Physical Exam Updated Vital Signs BP (!) 216/108 (BP Location: Left Arm)   Pulse 69   Temp 98.6 F (37 C) (Oral)   Resp 17   Ht 1.778 m (5\' 10" )   Wt 69.4 kg (153 lb)   SpO2 100%   BMI 21.95 kg/m   Physical Exam  Constitutional: He appears well-developed and well-nourished. No distress.  HENT:  Head: Normocephalic and atraumatic.  Right Ear: External ear normal.  Left Ear: External ear normal.  Eyes: Conjunctivae are normal. Right eye exhibits no discharge. Left eye exhibits no discharge. No scleral icterus.  Neck: Neck supple. No tracheal deviation present.  Cardiovascular: Normal rate, regular rhythm and intact distal pulses.  Pulmonary/Chest: Effort normal and breath sounds normal. No stridor. No respiratory distress. He has no wheezes. He has no rales.   Abdominal: Soft. Bowel sounds are normal. He exhibits no distension. There is no tenderness. There is no rebound and no guarding.  Musculoskeletal: He exhibits no edema or tenderness.  Neurological: He is alert. He has normal strength. No cranial nerve deficit (no facial droop, extraocular movements intact, no slurred speech) or sensory deficit. He exhibits normal muscle tone. He displays no seizure activity. Coordination normal.  Skin: Skin is warm and dry. No rash noted.  Psychiatric: He has a normal mood and affect.  Nursing note and vitals reviewed.    ED Treatments / Results  Labs (all labs ordered are listed, but only abnormal results are displayed) Labs Reviewed  CBC - Abnormal; Notable for the following components:      Result Value   RBC 4.06 (*)    MCV 103.7 (*)    MCH 36.2 (*)    All other components within normal limits  BASIC METABOLIC PANEL - Abnormal; Notable for the following components:   Potassium 3.1 (*)    All other components within normal limits  TROPONIN I - Abnormal; Notable for the following components:   Troponin I 0.03 (*)    All other components within normal limits    EKG EKG Interpretation  Date/Time:  Monday March 02 2018 19:02:41 EDT Ventricular Rate:  65 PR Interval:    QRS Duration: 109 QT Interval:  479 QTC Calculation: 499 R Axis:   82 Text Interpretation:  Sinus rhythm Inferior infarct, age indeterminate Probable anterolateral infarct, old No old tracing to compare Confirmed by Linwood DibblesKnapp, Koby Hartfield [phone]([id number]) on 03/02/2018 7:15:29 PM   Radiology No results found.  Procedures .Critical Care Performed by: Linwood DibblesKnapp, Yanissa Michalsky, MD Authorized by: Linwood DibblesKnapp, Anntonette Madewell, MD   Critical care provider statement:    Critical care time (minutes):  30   Critical care was time spent personally by me on the following activities:  Discussions with consultants, evaluation of patient's response to treatment, examination of patient, ordering and performing treatments and  interventions, ordering and review of laboratory studies, ordering and review of radiographic studies, pulse oximetry, re-evaluation of patient's condition, obtaining history from patient or surrogate and review of old charts   (including critical care time)  Medications Ordered in ED Medications  labetalol (NORMODYNE,TRANDATE) injection 20 mg (has no administration in time range)  hydrALAZINE (APRESOLINE) injection 10 mg (10 mg Intravenous Given 03/02/18 1927)  hydrALAZINE (APRESOLINE) injection 10 mg (10 mg Intravenous Given 03/02/18 2043)  amLODipine (NORVASC) tablet 10 mg (10 mg Oral Given 03/02/18 2044)     Initial Impression / Assessment and Plan / ED Course  I have reviewed the triage  vital signs and the nursing notes.  Pertinent labs & imaging results that were available during my care of the patient were reviewed by me and considered in my medical decision making (see chart for details).  Clinical Course as of Mar 02 2106  Mon Mar 02, 2018  2057 Labs notable for borderline troponin.  Potassium is low.     [JK]  2106 No sign improvement after 2 doses of hydralazine.  Will try a dose of labetalol.  Will monitor closely with his hx of COPD   [JK]    Clinical Course User Index [JK] Linwood Dibbles, MD    Patient presents to the emergency room with complaints of hypertension.  Patient states he has been taking his medication regularly however does not sound like his seen his doctor in a while to have his blood pressure rechecked.  Patient's blood pressure was extremely elevated at 228/130.  He was given doses of IV antihypertensive agents in the emergency room his blood pressure is 216/108.  Patient denies any trouble with chest pain however he does have some EKG changes and a borderline troponin.  This may be related to heart strain associated with hypertension.  I will consult the medical service considering these EKG abnormalities and troponin levels to  bring him in for observation, with  continued blood pressure management and serial troponins.  Final Clinical Impressions(s) / ED Diagnoses   Final diagnoses:  Hypertensive urgency      Linwood Dibbles, MD 03/02/18 2107

## 2018-03-02 NOTE — ED Notes (Signed)
Date and time results received: 03/02/18 2100 (use smartphrase ".now" to insert current time)  Test: troponin Critical Value: 0.03  Name of Provider Notified: Dr. Lynelle Doctorknapp  Orders Received? Or Actions Taken?: yes

## 2018-03-02 NOTE — H&P (Signed)
History and Physical    Isaac Rose:[id number] DOB: 24-Jun-1961 DOA: 03/02/2018  PCP: Elfredia Nevins, MD  Patient coming from: Home  Chief Complaint: High blood pressure  HPI: Isaac Rose is a 57 y.o. male with medical history significant of hypertension uncontrolled for years comes into the ED with persistent systolic blood pressures over 440 at home.  Patient reports he is been trying to get a hold of his primary care physician since Friday and has been unable to get a hold of him.  He says that he takes his blood pressure medications as prescribed but he does not know the names of them.  He denies any focal numbness tingling or weakness anywhere.  He denies any vision changes.  He has been having a headache which is chronic for him.  He denies any chest pain or shortness of breath.  Patient reports he has not had a history of stroke in the past.  He reports he had a heart catheterization last year for which he did not need any intervention for however I cannot find this in epic.  Patient is being referred for admission for malignant hypertension.  He is received several doses of hydralazine and is done nothing for his blood pressure.  He is requesting to go outside to smoke at this time.  Review of Systems: As per HPI otherwise 10 point review of systems negative.   Past Medical History:  Diagnosis Date  . Chronic constipation   . Depression   . GERD (gastroesophageal reflux disease)   . Headache   . Hypertension     Past Surgical History:  Procedure Laterality Date  . APPENDECTOMY    . COLONOSCOPY  08   NUR  . head trauma     from trauma  . UPPER GASTROINTESTINAL ENDOSCOPY       reports that he has been smoking.  He has been smoking about 3.00 packs per day. He quit smokeless tobacco use about 30 years ago. His smokeless tobacco use included chew. He reports that he drinks alcohol. He reports that he does not use drugs.  Allergies  Allergen Reactions  . Effexor  [Venlafaxine Hydrochloride] Other (See Comments)    unknown    Family History  Problem Relation Age of Onset  . Lung cancer Mother   . Emphysema Father   . Kidney cancer Sister   . Heart disease Brother   . Aneurysm Sister   . Breast cancer Sister   . Colon cancer Maternal Aunt   . Colon cancer Maternal Uncle        x4    Prior to Admission medications   Medication Sig Start Date End Date Taking? Authorizing Provider  buPROPion (WELLBUTRIN XL) 150 MG 24 hr tablet Take 150 mg by mouth daily.   Yes [provider]  losartan (COZAAR) 100 MG tablet Take 1 tablet (100 mg total) by mouth daily. 07/30/17  Yes Mesner, Barbara Cower, MD  VENTOLIN HFA 108 (90 BASE) MCG/ACT inhaler INHALE 2 PUFFS EVERY FOUR HOURS AS NEEDED FOR SHORTNESS OF BREATH 02/07/15  Yes [provider]  albuterol (PROVENTIL) (2.5 MG/3ML) 0.083% nebulizer solution Take 2.5 mg by nebulization every 6 (six) hours as needed.      [provider]    Physical Exam: Vitals:   03/02/18 1746 03/02/18 1747 03/02/18 2026  BP: (!) 214/110  (!) 216/108  Pulse: 72  69  Resp: 18  17  Temp: 97.9 F (36.6 C)  98.6 F (37  C)  TempSrc: Oral  Oral  SpO2: 99%  100%  Weight:  69.4 kg (153 lb)   Height:  5\' 10"  (1.778 m)       Constitutional: NAD, calm, comfortable Vitals:   03/02/18 1746 03/02/18 1747 03/02/18 2026  BP: (!) 214/110  (!) 216/108  Pulse: 72  69  Resp: 18  17  Temp: 97.9 F (36.6 C)  98.6 F (37 C)  TempSrc: Oral  Oral  SpO2: 99%  100%  Weight:  69.4 kg (153 lb)   Height:  5\' 10"  (1.778 m)    Eyes: PERRL, lids and conjunctivae normal ENMT: Mucous membranes are moist. Posterior pharynx clear of any exudate or lesions.Normal dentition.  Neck: normal, supple, no masses, no thyromegaly Respiratory: clear to auscultation bilaterally, no wheezing, no crackles. Normal respiratory effort. No accessory muscle use.  Cardiovascular: Regular rate and rhythm, no murmurs / rubs / gallops. No  extremity edema. 2+ pedal pulses. No carotid bruits.  Abdomen: no tenderness, no masses palpated. No hepatosplenomegaly. Bowel sounds positive.  Musculoskeletal: no clubbing / cyanosis. No joint deformity upper and lower extremities. Good ROM, no contractures. Normal muscle tone.  Skin: no rashes, lesions, ulcers. No induration Neurologic: CN 2-12 grossly intact. Sensation intact, DTR normal. Strength 5/5 in all 4.  Psychiatric: Normal judgment and insight. Alert and oriented x 3. Normal mood.    Labs on Admission: I have personally reviewed following labs and imaging studies  CBC: Recent Labs  Lab 03/02/18 1925  WBC 7.0  HGB 14.7  HCT 42.1  MCV 103.7*  PLT 244   Basic Metabolic Panel: Recent Labs  Lab 03/02/18 1925  NA 139  K 3.1*  CL 103  CO2 28  GLUCOSE 85  BUN 11  CREATININE 0.87  CALCIUM 9.2   GFR: Estimated Creatinine Clearance: 90.8 mL/min (by C-G formula based on SCr of 0.87 mg/dL). Liver Function Tests: No results for input(s): AST, ALT, ALKPHOS, BILITOT, PROT, ALBUMIN in the last 168 hours. No results for input(s): LIPASE, AMYLASE in the last 168 hours. No results for input(s): AMMONIA in the last 168 hours. Coagulation Profile: No results for input(s): INR, PROTIME in the last 168 hours. Cardiac Enzymes: Recent Labs  Lab 03/02/18 1925  TROPONINI 0.03*   BNP (last 3 results) No results for input(s): PROBNP in the last 8760 hours. HbA1C: No results for input(s): HGBA1C in the last 72 hours. CBG: No results for input(s): GLUCAP in the last 168 hours. Lipid Profile: No results for input(s): CHOL, HDL, LDLCALC, TRIG, CHOLHDL, LDLDIRECT in the last 72 hours. Thyroid Function Tests: No results for input(s): TSH, T4TOTAL, FREET4, T3FREE, THYROIDAB in the last 72 hours. Anemia Panel: No results for input(s): VITAMINB12, FOLATE, FERRITIN, TIBC, IRON, RETICCTPCT in the last 72 hours. Urine analysis:    Component Value Date/Time   COLORURINE COLORLESS (A)  07/29/2017 2153   APPEARANCEUR CLEAR 07/29/2017 2153   LABSPEC 1.003 (L) 07/29/2017 2153   PHURINE 6.0 07/29/2017 2153   GLUCOSEU NEGATIVE 07/29/2017 2153   HGBUR MODERATE (A) 07/29/2017 2153   BILIRUBINUR NEGATIVE 07/29/2017 2153   KETONESUR NEGATIVE 07/29/2017 2153   PROTEINUR NEGATIVE 07/29/2017 2153   NITRITE NEGATIVE 07/29/2017 2153   LEUKOCYTESUR NEGATIVE 07/29/2017 2153   Sepsis Labs: !!!!!!!!!!!!!!!!!!!!!!!!!!!!!!!!!!!!!!!!!!!! @LABRCNTIP (procalcitonin:4,lacticidven:4) )No results found for this or any previous visit (from the past 240 hour(s)).   Radiological Exams on Admission: No results found.  EKG: Independently reviewed.  Normal sinus rhythm no acute changes  Old chart reviewed  Case  discussed with Dr. Iantha FallenJohn Knapp in the ED  Assessment/Plan 57 year old male with malignant hypertension Principal Problem:   Malignant hypertension-try dose of labetalol.  We will also add a orally  metoprolol 50 mg twice a day.  Continue as needed labetalol dosing every 2 hours as needed.  Continue his Cozaar 100 mg daily.  Continue to adjust blood pressure medications as needed.  Will rule out underlying renal artery stenosis and check a renal ultrasound.  Patient has a troponin of 0.03.  We will continue to some serial troponins overnight.  Will not order cardiac echo unless troponin continues to rise.  Active Problems:   COPD (chronic obstructive pulmonary disease) (HCC)-stable continue home albuterol    Chronic daily headache-stable  Tobacco abuse-offer nicotine patch.   DVT prophylaxis: SCDs Code Status: Full Family Communication: None Disposition Plan: Likely 1 to 2 days Consults called: None Admission status: Admission   Tanae Petrosky A MD [facility]  If 7PM-7AM, please contact night-coverage [email or web address] Password [email or web address]  03/02/2018, 9:34 PM

## 2018-03-02 NOTE — ED Triage Notes (Addendum)
Pt reports that bp was 228/132 this morning . Pt reports he has been taking his meds every morning. Complaining of HA. Pt smokes 3 packs of cigarettes a day

## 2018-03-03 ENCOUNTER — Inpatient Hospital Stay (HOSPITAL_COMMUNITY): Payer: BC Managed Care – PPO

## 2018-03-03 DIAGNOSIS — R51 Headache: Secondary | ICD-10-CM | POA: Diagnosis not present

## 2018-03-03 DIAGNOSIS — I160 Hypertensive urgency: Secondary | ICD-10-CM

## 2018-03-03 DIAGNOSIS — Z720 Tobacco use: Secondary | ICD-10-CM | POA: Diagnosis not present

## 2018-03-03 DIAGNOSIS — J449 Chronic obstructive pulmonary disease, unspecified: Secondary | ICD-10-CM

## 2018-03-03 LAB — CBC
HEMATOCRIT: 44.6 % (ref 39.0–52.0)
Hemoglobin: 15.3 g/dL (ref 13.0–17.0)
MCH: 35.9 pg — AB (ref 26.0–34.0)
MCHC: 34.3 g/dL (ref 30.0–36.0)
MCV: 104.7 fL — AB (ref 78.0–100.0)
Platelets: 235 10*3/uL (ref 150–400)
RBC: 4.26 MIL/uL (ref 4.22–5.81)
RDW: 13.8 % (ref 11.5–15.5)
WBC: 7 10*3/uL (ref 4.0–10.5)

## 2018-03-03 LAB — BASIC METABOLIC PANEL
Anion gap: 10 (ref 5–15)
BUN: 9 mg/dL (ref 6–20)
CO2: 24 mmol/L (ref 22–32)
Calcium: 9 mg/dL (ref 8.9–10.3)
Chloride: 106 mmol/L (ref 98–111)
Creatinine, Ser: 0.72 mg/dL (ref 0.61–1.24)
GFR calc Af Amer: >60 mL/min (ref >60)
GLUCOSE: 91 mg/dL (ref 70–99)
POTASSIUM: 3 mmol/L — AB (ref 3.5–5.1)
Sodium: 140 mmol/L (ref 135–145)

## 2018-03-03 LAB — MRSA PCR SCREENING: MRSA by PCR: NEGATIVE

## 2018-03-03 LAB — MAGNESIUM: Magnesium: 1.8 mg/dL (ref 1.7–2.4)

## 2018-03-03 LAB — TROPONIN I
Troponin I: 0.04 ng/mL (ref <0.03)
Troponin I: 0.04 ng/mL (ref <0.03)

## 2018-03-03 MED ORDER — POTASSIUM CHLORIDE CRYS ER 20 MEQ PO TBCR
40.0000 meq | EXTENDED_RELEASE_TABLET | ORAL | Status: DC
  Administered 2018-03-03: 40 meq via ORAL
  Filled 2018-03-03: qty 2

## 2018-03-03 MED ORDER — NICOTINE 21 MG/24HR TD PT24: 21.0000 mg | MEDICATED_PATCH | Freq: Every day | TRANSDERMAL | 1 refills | Status: DC

## 2018-03-03 MED ORDER — TRIAMTERENE-HCTZ 37.5-25 MG PO TABS
1.0000 | ORAL_TABLET | Freq: Every day | ORAL | Status: DC
  Administered 2018-03-03: 1 via ORAL
  Filled 2018-03-03: qty 1

## 2018-03-03 MED ORDER — TRIAMTERENE-HCTZ 37.5-25 MG PO TABS: 1.0000 | ORAL_TABLET | Freq: Every day | ORAL | 3 refills | Status: DC

## 2018-03-03 MED ORDER — HYDRALAZINE HCL 20 MG/ML IJ SOLN: 10.0000 mg | Freq: Three times a day (TID) | INTRAMUSCULAR | Status: DC | PRN

## 2018-03-03 MED ORDER — LOSARTAN POTASSIUM 100 MG PO TABS: 100.0000 mg | ORAL_TABLET | Freq: Every day | ORAL | 3 refills | Status: DC

## 2018-03-03 MED ORDER — LABETALOL HCL 200 MG PO TABS
200.0000 mg | ORAL_TABLET | Freq: Two times a day (BID) | ORAL | Status: DC
  Administered 2018-03-03: 200 mg via ORAL
  Filled 2018-03-03: qty 1

## 2018-03-03 MED ORDER — LABETALOL HCL 200 MG PO TABS: 200.0000 mg | ORAL_TABLET | Freq: Two times a day (BID) | ORAL | 3 refills | Status: DC

## 2018-03-03 NOTE — Discharge Summary (Signed)
Physician Discharge Summary  Isaac Rose ZOX:[id number] DOB: January 27, 1961 DOA: 03/02/2018  PCP: Elfredia Nevins, MD  Admit date: 03/02/2018 Discharge date: 03/03/2018  Time spent: 35 minutes  Recommendations for Outpatient Follow-up:  1. Repeat basic metabolic panel to follow electrolytes and renal function 2. Reassess blood pressure and further adjust antihypertensive regimen as needed. 3. Please continue to assist patient with smoking cessation.   Discharge Diagnoses:  Principal Problem:   Malignant hypertension Active Problems:   COPD (chronic obstructive pulmonary disease) (HCC)   Chronic daily headache   Hypertensive urgency   Tobacco abuse   Discharge Condition: Stable and improved.  Discharge home with instruction to follow-up with PCP in 10 days.  Diet recommendation: Heart healthy diet  Filed Weights   03/02/18 1747 03/02/18 2320 03/03/18 0400  Weight: 69.4 kg (153 lb) 68.5 kg (151 lb 0.2 oz) 67.8 kg (149 lb 7.6 oz)    History of present illness:  As per H&P written by Dr. Onalee Hua 57 y.o. male with medical history significant of hypertension uncontrolled for years comes into the ED with persistent systolic blood pressures over 811 at home.  Patient reports he is been trying to get a hold of his primary care physician since Friday and has been unable to get a hold of him.  He says that he takes his blood pressure medications as prescribed but he does not know the names of them.  He denies any focal numbness tingling or weakness anywhere.  He denies any vision changes.  He has been having a headache which is chronic for him.  He denies any chest pain or shortness of breath.  Patient reports he has not had a history of stroke in the past.  He reports he had a heart catheterization last year for which he did not need any intervention for however I cannot find this in epic.   Hospital Course:  1-malignant hypertension/hypertensive urgency -Blood pressure improved and  currently well controlled with the use of losartan, HCTZ and labetalol -Patient advised to quit smoking and to follow low-sodium diet -Will benefit of 2D echo as an outpatient and further adjustment to his antihypertensive drugs based on blood pressure fluctuation.  2-tobacco abuse -I have discussed tobacco cessation with the patient.  I have counseled the patient regarding the negative impacts of continued tobacco use including but not limited to lung cancer, COPD, and cardiovascular disease.  I have discussed alternatives to tobacco and modalities that may help facilitate tobacco cessation including but not limited to biofeedback, hypnosis, and medications.  Total time spent with tobacco counseling was 5 minutes. -nicotine patch prescribed  3-depression -continue Wellbutrin -No suicidal ideation or hallucinations.  4-GERD -Patient reports no reflux symptoms currently -Instructed to minimize the use of NSAIDs and my required PPI as an outpatient if symptoms continue.  5-chronic headache -Patient has been started on labetalol for blood pressure control which will also help as a prophylaxis agent for migraines. -He denies any headache currently. -Patient elevated/uncontrolled blood pressure prior to admission was most likely also playing a role in his chronic headaches.  6-hypokalemia -Electrolytes has been repleted -Please repeat basic metabolic panel at follow-up visit to reassess electrolytes trend. -He might require daily maintenance supplementation.  Procedures:  See below for x-ray reports.  Consultations:  None  Discharge Exam: Vitals:   03/03/18 0727 03/03/18 1106  BP:    Pulse: (!) 56   Resp: (!) 23   Temp: 98 F (36.7 C) 98.2 F (36.8 C)  SpO2:  99%     General: Afebrile, no chest pain, no headache, no nausea, no vomiting, no palpitations.  Patient blood pressure significantly improved and he is asking to go home. Cardiovascular: S1 and S2, no rubs, no gallops,  no murmurs. Respiratory: Good air movement bilaterally, positive scattered rhonchi, no wheezing. Abdomen: Soft, nontender, no distention, positive bowel sounds Extremities: No edema, no cyanosis, no clubbing.  Discharge Instructions   Discharge Instructions    Diet - low sodium heart healthy   Complete by:  As directed    Discharge instructions   Complete by:  As directed    Follow heart healthy diet (low sodium; less than 2.5 g of sodium in 24 hours). Keep yourself well-hydrated Take medications as prescribed Please arrange follow-up with PCP in 10 days. Stop smoking.     Allergies as of 03/03/2018      Reactions   Effexor [venlafaxine Hydrochloride] Other (See Comments)   unknown      Medication List    TAKE these medications   albuterol (2.5 MG/3ML) 0.083% nebulizer solution Commonly known as:  PROVENTIL Take 2.5 mg by nebulization every 6 (six) hours as needed.   VENTOLIN HFA 108 (90 Base) MCG/ACT inhaler Generic drug:  albuterol INHALE 2 PUFFS EVERY FOUR HOURS AS NEEDED FOR SHORTNESS OF BREATH   buPROPion 150 MG 24 hr tablet Commonly known as:  WELLBUTRIN XL Take 150 mg by mouth daily.   labetalol 200 MG tablet Commonly known as:  NORMODYNE Take 1 tablet (200 mg total) by mouth 2 (two) times daily.   losartan 100 MG tablet Commonly known as:  COZAAR Take 1 tablet (100 mg total) by mouth daily.   nicotine 21 mg/24hr patch Commonly known as:  NICODERM CQ - dosed in mg/24 hours Place 1 patch (21 mg total) onto the skin daily. Start taking on:  03/04/2018   triamterene-hydrochlorothiazide 37.5-25 MG tablet Commonly known as:  MAXZIDE-25 Take 1 tablet by mouth daily. Start taking on:  03/04/2018      Allergies  Allergen Reactions  . Effexor [Venlafaxine Hydrochloride] Other (See Comments)    unknown   Follow-up Information    Elfredia Nevins, MD. Schedule an appointment as soon as possible for a visit in 10 day(s).   Specialty:  Internal  Medicine Contact information: [address] [place] [address] [phone]           The results of significant diagnostics from this hospitalization (including imaging, microbiology, ancillary and laboratory) are listed below for reference.    Significant Diagnostic Studies: No results found.  Microbiology: Recent Results (from the past 240 hour(s))  MRSA PCR Screening     Status: None   Collection Time: 03/02/18 11:16 PM  Result Value Ref Range Status   MRSA by PCR NEGATIVE NEGATIVE Final    Comment:        The GeneXpert MRSA Assay (FDA approved for NASAL specimens only), is one component of a comprehensive MRSA colonization surveillance program. It is not intended to diagnose MRSA infection nor to guide or monitor treatment for MRSA infections. Performed at [facility], [address], [place], [place] [address]      Labs: Basic Metabolic Panel: Recent Labs  Lab 03/02/18 1925 03/03/18 0357 03/03/18 0830  NA 139 140  --   K 3.1* 3.0*  --   CL 103 106  --   CO2 28 24  --   GLUCOSE 85 91  --   BUN 11 9  --  CREATININE 0.87 0.72  --   CALCIUM 9.2 9.0  --   MG  --   --  1.8   CBC: Recent Labs  Lab 03/02/18 1925 03/03/18 0357  WBC 7.0 7.0  HGB 14.7 15.3  HCT 42.1 44.6  MCV 103.7* 104.7*  PLT 244 235   Cardiac Enzymes: Recent Labs  Lab 03/02/18 1925 03/02/18 2331 03/03/18 0357  TROPONINI 0.03* 0.04* 0.04*    Signed:  Vassie Lollarlos Tynetta Bachmann MD.  [facility] 03/03/2018, 12:23 PM

## 2018-06-05 ENCOUNTER — Emergency Department (HOSPITAL_COMMUNITY): Payer: BC Managed Care – PPO

## 2018-06-05 ENCOUNTER — Other Ambulatory Visit: Payer: Self-pay

## 2018-06-05 ENCOUNTER — Emergency Department (HOSPITAL_COMMUNITY)
Admission: EM | Admit: 2018-06-05 | Discharge: 2018-06-05 | Disposition: A | Discharge status: Home / Self Care | Payer: BC Managed Care – PPO | Attending: Emergency Medicine | Admitting: Emergency Medicine

## 2018-06-05 ENCOUNTER — Encounter (HOSPITAL_COMMUNITY): Payer: Self-pay | Admitting: Emergency Medicine

## 2018-06-05 DIAGNOSIS — R0602 Shortness of breath: Secondary | ICD-10-CM | POA: Diagnosis present

## 2018-06-05 DIAGNOSIS — Z79899 Other long term (current) drug therapy: Secondary | ICD-10-CM | POA: Diagnosis not present

## 2018-06-05 DIAGNOSIS — J441 Chronic obstructive pulmonary disease with (acute) exacerbation: Secondary | ICD-10-CM | POA: Diagnosis not present

## 2018-06-05 DIAGNOSIS — I10 Essential (primary) hypertension: Secondary | ICD-10-CM | POA: Insufficient documentation

## 2018-06-05 DIAGNOSIS — F17200 Nicotine dependence, unspecified, uncomplicated: Secondary | ICD-10-CM | POA: Diagnosis not present

## 2018-06-05 HISTORY — DX: Chronic obstructive pulmonary disease, unspecified: J44.9

## 2018-06-05 LAB — BASIC METABOLIC PANEL
Anion gap: 8 (ref 5–15)
BUN: 9 mg/dL (ref 6–20)
CO2: 25 mmol/L (ref 22–32)
Calcium: 10.1 mg/dL (ref 8.9–10.3)
Chloride: 106 mmol/L (ref 98–111)
Creatinine, Ser: 0.92 mg/dL (ref 0.61–1.24)
GFR calc Af Amer: >60 mL/min (ref >60)
GFR calc non Af Amer: >60 mL/min (ref >60)
Glucose, Bld: 98 mg/dL (ref 70–99)
Potassium: 3.6 mmol/L (ref 3.5–5.1)
Sodium: 139 mmol/L (ref 135–145)

## 2018-06-05 LAB — CBC WITH DIFFERENTIAL/PLATELET
Abs Immature Granulocytes: 0.01 10*3/uL (ref 0.00–0.07)
Basophils Absolute: 0.1 10*3/uL (ref 0.0–0.1)
Basophils Relative: 2 %
Eosinophils Absolute: 0.2 10*3/uL (ref 0.0–0.5)
Eosinophils Relative: 3 %
HCT: 44.8 % (ref 39.0–52.0)
Hemoglobin: 15.3 g/dL (ref 13.0–17.0)
Immature Granulocytes: 0 %
Lymphocytes Relative: 34 %
Lymphs Abs: 2.3 10*3/uL (ref 0.7–4.0)
MCH: 35.2 pg — ABNORMAL HIGH (ref 26.0–34.0)
MCHC: 34.2 g/dL (ref 30.0–36.0)
MCV: 103 fL — ABNORMAL HIGH (ref 80.0–100.0)
Monocytes Absolute: 0.7 10*3/uL (ref 0.1–1.0)
Monocytes Relative: 11 %
Neutro Abs: 3.5 10*3/uL (ref 1.7–7.7)
Neutrophils Relative %: 50 %
Platelets: 251 10*3/uL (ref 150–400)
RBC: 4.35 MIL/uL (ref 4.22–5.81)
RDW: 13.2 % (ref 11.5–15.5)
WBC: 6.8 10*3/uL (ref 4.0–10.5)
nRBC: 0 % (ref 0.0–0.2)

## 2018-06-05 LAB — TROPONIN I: Troponin I: <0.03 ng/mL (ref <0.03)

## 2018-06-05 LAB — BRAIN NATRIURETIC PEPTIDE: B Natriuretic Peptide: 30 pg/mL (ref 0.0–100.0)

## 2018-06-05 MED ORDER — ALBUTEROL SULFATE (2.5 MG/3ML) 0.083% IN NEBU
5.0000 mg | INHALATION_SOLUTION | Freq: Once | RESPIRATORY_TRACT | Status: AC
  Administered 2018-06-05: 5 mg via RESPIRATORY_TRACT
  Filled 2018-06-05: qty 6

## 2018-06-05 MED ORDER — METHYLPREDNISOLONE SODIUM SUCC 40 MG IJ SOLR
80.0000 mg | Freq: Once | INTRAMUSCULAR | Status: AC
  Administered 2018-06-05: 80 mg via INTRAVENOUS
  Filled 2018-06-05: qty 2

## 2018-06-05 MED ORDER — PREDNISONE 20 MG PO TABS: 40.0000 mg | ORAL_TABLET | Freq: Every day | ORAL | 0 refills | Status: DC

## 2018-06-05 NOTE — ED Triage Notes (Signed)
Patient sent from [facility] for complaint of shortness of breath "for months" worsening over the last month. Denies chest pain. States shortness of breath is affecting his job performance.

## 2018-06-07 NOTE — ED Provider Notes (Signed)
[facility] EMERGENCY DEPARTMENT Provider Note   CSN: [id number] Arrival date & time: 06/05/18  4098     History   Chief Complaint Chief Complaint  Patient presents with  . Shortness of Breath    HPI Isaac Rose is a 57 y.o. male.  HPI   57 year old male with dyspnea.  He reports chronic shortness of breath but worse over the past weeks to months.  History of COPD.  On home oxygen.  He feels reasonably comfortable at rest but feels short of breath when he tries to exert himself.  It is affecting his ability to do his job.  He denies any acute pain.  No fevers or chills.  No acute cough.  No unusual swelling.  Past Medical History:  Diagnosis Date  . Chronic constipation   . COPD (chronic obstructive pulmonary disease) (HCC)   . Depression   . GERD (gastroesophageal reflux disease)   . Headache   . Hypertension     Patient Active Problem List   Diagnosis Date Noted  . Hypertensive urgency   . Tobacco abuse   . Malignant hypertension 03/02/2018  . Chronic daily headache 03/20/2015  . Constipation 04/28/2012  . Hx of adenomatous colonic polyps 04/28/2012  . COPD (chronic obstructive pulmonary disease) (HCC) 04/28/2012  . HTN (hypertension) 04/28/2012  . Smoking trying to quit 04/28/2012    Past Surgical History:  Procedure Laterality Date  . APPENDECTOMY    . COLONOSCOPY  08   NUR  . head trauma     from trauma  . UPPER GASTROINTESTINAL ENDOSCOPY          Home Medications    Prior to Admission medications   Medication Sig Start Date End Date Taking? Authorizing Provider  albuterol (PROVENTIL) (2.5 MG/3ML) 0.083% nebulizer solution Take 2.5 mg by nebulization every 6 (six) hours as needed.     Yes [provider]  buPROPion (WELLBUTRIN XL) 150 MG 24 hr tablet Take 150 mg by mouth daily.   Yes [provider]  labetalol (NORMODYNE) 200 MG tablet Take 1 tablet (200 mg total) by mouth 2 (two) times daily. 03/03/18  Yes Vassie Loll,  MD  VENTOLIN HFA 108 (90 BASE) MCG/ACT inhaler INHALE 2 PUFFS EVERY FOUR HOURS AS NEEDED FOR SHORTNESS OF BREATH 02/07/15  Yes [provider]  amLODipine (NORVASC) 10 MG tablet Take 10 mg by mouth daily. 05/26/18   [provider]  losartan (COZAAR) 100 MG tablet Take 1 tablet (100 mg total) by mouth daily. Patient not taking: Reported on 06/05/2018 03/03/18   Vassie Loll, MD  nicotine (NICODERM CQ - DOSED IN MG/24 HOURS) 21 mg/24hr patch Place 1 patch (21 mg total) onto the skin daily. 03/04/18   Vassie Loll, MD  predniSONE (DELTASONE) 20 MG tablet Take 2 tablets (40 mg total) by mouth daily. 06/05/18   Raeford Razor, MD  triamterene-hydrochlorothiazide (MAXZIDE-25) 37.5-25 MG tablet Take 1 tablet by mouth daily. Patient not taking: Reported on 06/05/2018 03/04/18   Vassie Loll, MD    Family History Family History  Problem Relation Age of Onset  . Lung cancer Mother   . Emphysema Father   . Kidney cancer Sister   . Heart disease Brother   . Aneurysm Sister   . Breast cancer Sister   . Colon cancer Maternal Aunt   . Colon cancer Maternal Uncle        x4    Social History Social History   Tobacco Use  . Smoking status:  Current Every Day Smoker    Packs/day: 3.00  . Smokeless tobacco: Former Neurosurgeon    Types: Chew    Quit date: 04/29/1987  . Tobacco comment: 1 1/2 pack a day 34 yrs patient is aware he needs to quit   Substance Use Topics  . Alcohol use: Yes    Alcohol/week: 0.0 standard drinks    Comment: occasionally  . Drug use: No     Allergies   Effexor [venlafaxine hydrochloride]   Review of Systems Review of Systems  All systems reviewed and negative, other than as noted in HPI.  Physical Exam Updated Vital Signs BP (!) 159/92   Pulse (!) 56   Temp 97.7 F (36.5 C) (Oral)   Resp (!) 29   Ht 5\' 9"  (1.753 m)   Wt 69.9 kg   SpO2 100%   BMI 22.74 kg/m   Physical Exam  Constitutional: He appears well-developed and well-nourished. No  distress.  HENT:  Head: Normocephalic and atraumatic.  Eyes: Conjunctivae are normal. Right eye exhibits no discharge. Left eye exhibits no discharge.  Neck: Neck supple.  Cardiovascular: Normal rate, regular rhythm and normal heart sounds. Exam reveals no gallop and no friction rub.  No murmur heard. Pulmonary/Chest: Effort normal. No respiratory distress. He has wheezes.  Abdominal: Soft. He exhibits no distension. There is no tenderness.  Musculoskeletal: He exhibits no edema or tenderness.  Lower extremities symmetric as compared to each other. No calf tenderness. Negative Homan's. No palpable cords.   Neurological: He is alert.  Skin: Skin is warm and dry.  Psychiatric: He has a normal mood and affect. His behavior is normal. Thought content normal.  Nursing note and vitals reviewed.    ED Treatments / Results  Labs (all labs ordered are listed, but only abnormal results are displayed) Labs Reviewed  CBC WITH DIFFERENTIAL/PLATELET - Abnormal; Notable for the following components:      Result Value   MCV 103.0 (*)    MCH 35.2 (*)    All other components within normal limits  BASIC METABOLIC PANEL  BRAIN NATRIURETIC PEPTIDE  TROPONIN I    EKG EKG Interpretation  Date/Time:  Friday June 05 2018 09:46:39 EDT Ventricular Rate:  56 PR Interval:    QRS Duration: 109 QT Interval:  444 QTC Calculation: 429 R Axis:   80 Text Interpretation:  Sinus rhythm Probable inferior infarct, old Probable anterolateral infarct, old Confirmed by Raeford Razor [phone]) on 06/05/2018 10:12:53 AM   Radiology No results found.  Procedures Procedures (including critical care time)  Medications Ordered in ED Medications  albuterol (PROVENTIL) (2.5 MG/3ML) 0.083% nebulizer solution 5 mg (5 mg Nebulization Given 06/05/18 1014)  methylPREDNISolone sodium succinate (SOLU-MEDROL) 40 mg/mL injection 80 mg (80 mg Intravenous Given 06/05/18 1103)     Initial Impression / Assessment and  Plan / ED Course  I have reviewed the triage vital signs and the nursing notes.  Pertinent labs & imaging results that were available during my care of the patient were reviewed by me and considered in my medical decision making (see chart for details).     57 year old male with worsening dyspnea over weeks to months.  Clinically COPD exacerbation.  Much improved after neb.  In the emergency room.  Solu-Medrol given.  Plan continue steroids.  Continue his home medication.  Return precautions were discussed.  Outpatient follow-up otherwise.  Final Clinical Impressions(s) / ED Diagnoses   Final diagnoses:  COPD exacerbation [facility])    ED Discharge Orders  Ordered    predniSONE (DELTASONE) 20 MG tablet  Daily     06/05/18 1319           Raeford Razor, MD 06/07/18 2349

## 2018-09-21 ENCOUNTER — Ambulatory Visit (HOSPITAL_COMMUNITY)
Admission: RE | Admit: 2018-09-21 | Discharge: 2018-09-21 | Disposition: A | Discharge status: Home / Self Care | Payer: BC Managed Care – PPO | Source: Ambulatory Visit | Attending: Family Medicine | Admitting: Family Medicine

## 2018-09-21 ENCOUNTER — Other Ambulatory Visit (HOSPITAL_COMMUNITY): Payer: Self-pay | Admitting: Family Medicine

## 2018-09-21 DIAGNOSIS — J441 Chronic obstructive pulmonary disease with (acute) exacerbation: Secondary | ICD-10-CM | POA: Diagnosis present

## 2018-09-25 ENCOUNTER — Other Ambulatory Visit (HOSPITAL_COMMUNITY): Payer: Self-pay | Admitting: Family Medicine

## 2018-09-25 ENCOUNTER — Other Ambulatory Visit: Payer: Self-pay | Admitting: Family Medicine

## 2018-09-25 DIAGNOSIS — F17219 Nicotine dependence, cigarettes, with unspecified nicotine-induced disorders: Secondary | ICD-10-CM

## 2018-10-13 ENCOUNTER — Encounter (INDEPENDENT_AMBULATORY_CARE_PROVIDER_SITE_OTHER): Payer: Self-pay | Admitting: *Deleted

## 2018-10-21 ENCOUNTER — Ambulatory Visit: Payer: BC Managed Care – PPO | Admitting: Pulmonary Disease

## 2018-10-21 ENCOUNTER — Other Ambulatory Visit: Payer: Self-pay

## 2018-10-21 ENCOUNTER — Encounter: Payer: Self-pay | Admitting: Pulmonary Disease

## 2018-10-21 VITALS — BP 142/90 | HR 65 | Ht 69.0 in | Wt 159.6 lb

## 2018-10-21 DIAGNOSIS — R0602 Shortness of breath: Secondary | ICD-10-CM

## 2018-10-21 DIAGNOSIS — F17210 Nicotine dependence, cigarettes, uncomplicated: Secondary | ICD-10-CM

## 2018-10-21 DIAGNOSIS — R059 Cough, unspecified: Secondary | ICD-10-CM

## 2018-10-21 DIAGNOSIS — R05 Cough: Secondary | ICD-10-CM | POA: Diagnosis not present

## 2018-10-21 DIAGNOSIS — J449 Chronic obstructive pulmonary disease, unspecified: Secondary | ICD-10-CM

## 2018-10-21 DIAGNOSIS — R634 Abnormal weight loss: Secondary | ICD-10-CM

## 2018-10-21 MED ORDER — FLUTICASONE-UMECLIDIN-VILANT 100-62.5-25 MCG/INH IN AEPB: 1.0000 | INHALATION_SPRAY | Freq: Every day | RESPIRATORY_TRACT | 0 refills | Status: DC

## 2018-10-21 NOTE — Patient Instructions (Addendum)
Thank you for visiting Dr. Tonia Brooms at [facility]. Today we recommend the following:  We will follow-up on the CT scan you have scheduled on Friday. We will start you on Trelegy inhaler. We will order full pulmonary function test to be completed on your next office visit. You must quit smoking.  Consider using nicotine supplements to include lozenges, patches or gum.  Please let us know if we can be of any assistance or encouragement in this process.  Return in about 2 weeks (around 11/04/2018).  Patient can be scheduled for follow-up with Elisha Headland, NP.

## 2018-10-21 NOTE — Progress Notes (Signed)
Synopsis: Referred in March 2020 for cough by Isaac Nevins, MD  Subjective:   PATIENT ID: Isaac Rose GENDER: male DOB: 11-22-1960, MRN: [id number]  Chief Complaint  Patient presents with  . Pulmonary Consult    dry cough, SOB    C/o cough that's been going since December. He has been having mildly productive cough. The cough just started one day. He has been on 2 rounds of abx and prednisone. Current smoker since age 58 years currently smokes 3 packs/day.Marland Kitchen He has lost some weight. Only eating one meal per day. He has lost about 20 lbs. He was wearing a waist 34 and now down to a 29. The weight loss has been going on for the past 6 months. Mother died of lung cancer. Father with emphysema. Both died from these disease.  Patient denies hemoptysis.  Denies fevers.   Past Medical History:  Diagnosis Date  . Chronic constipation   . COPD (chronic obstructive pulmonary disease) (HCC)   . Depression   . GERD (gastroesophageal reflux disease)   . Headache   . Hypertension      Family History  Problem Relation Age of Onset  . Lung cancer Mother   . Emphysema Father   . Kidney cancer Sister   . Heart disease Brother   . Aneurysm Sister   . Breast cancer Sister   . Colon cancer Maternal Aunt   . Colon cancer Maternal Uncle        x4     Past Surgical History:  Procedure Laterality Date  . APPENDECTOMY    . COLONOSCOPY  08   NUR  . head trauma     from trauma  . UPPER GASTROINTESTINAL ENDOSCOPY      Social History   Socioeconomic History  . Marital status: Legally Separated    Spouse name: Not on file  . Number of children: Not on file  . Years of education: Not on file  . Highest education level: Not on file  Occupational History  . Not on file  Social Needs  . Financial resource strain: Not on file  . Food insecurity:    Worry: Not on file    Inability: Not on file  . Transportation needs:    Medical: Not on file    Non-medical: Not on file  Tobacco  Use  . Smoking status: Current Every Day Smoker    Packs/day: 3.00  . Smokeless tobacco: Former Neurosurgeon    Types: Chew    Quit date: 04/29/1987  . Tobacco comment: 1 1/2 pack a day 35 yrs patient is aware he needs to quit   Substance and Sexual Activity  . Alcohol use: Yes    Alcohol/week: 0.0 standard drinks    Comment: occasionally  . Drug use: No  . Sexual activity: Never    Partners: Female  Lifestyle  . Physical activity:    Days per week: Not on file    Minutes per session: Not on file  . Stress: Not on file  Relationships  . Social connections:    Talks on phone: Not on file    Gets together: Not on file    Attends religious service: Not on file    Active member of club or organization: Not on file    Attends meetings of clubs or organizations: Not on file    Relationship status: Not on file  . Intimate partner violence:    Fear of current or ex partner: Not on file  Emotionally abused: Not on file    Physically abused: Not on file    Forced sexual activity: Not on file  Other Topics Concern  . Not on file  Social History Narrative  . Not on file     Allergies  Allergen Reactions  . Effexor [Venlafaxine Hydrochloride] Other (See Comments)    unknown     Outpatient Medications Prior to Visit  Medication Sig Dispense Refill  . albuterol (PROVENTIL) (2.5 MG/3ML) 0.083% nebulizer solution Take 2.5 mg by nebulization every 6 (six) hours as needed.      Marland Kitchen amLODipine (NORVASC) 10 MG tablet Take 10 mg by mouth daily.  0  . buPROPion (WELLBUTRIN XL) 150 MG 24 hr tablet Take 150 mg by mouth daily.    Marland Kitchen labetalol (NORMODYNE) 200 MG tablet Take 1 tablet (200 mg total) by mouth 2 (two) times daily. 60 tablet 3  . losartan (COZAAR) 100 MG tablet Take 1 tablet (100 mg total) by mouth daily. 30 tablet 3  . nicotine (NICODERM CQ - DOSED IN MG/24 HOURS) 21 mg/24hr patch Place 1 patch (21 mg total) onto the skin daily. 28 patch 1  . predniSONE (DELTASONE) 20 MG tablet Take 2  tablets (40 mg total) by mouth daily. 10 tablet 0  . triamterene-hydrochlorothiazide (MAXZIDE-25) 37.5-25 MG tablet Take 1 tablet by mouth daily. 30 tablet 3  . VENTOLIN HFA 108 (90 BASE) MCG/ACT inhaler INHALE 2 PUFFS EVERY FOUR HOURS AS NEEDED FOR SHORTNESS OF BREATH  3  . omeprazole (PRILOSEC) 20 MG capsule     . SPIRIVA HANDIHALER 18 MCG inhalation capsule     . sucralfate (CARAFATE) 1 g tablet      No facility-administered medications prior to visit.     Review of Systems  Constitutional: Positive for weight loss. Negative for chills, fever and malaise/fatigue.  HENT: Negative for hearing loss, sore throat and tinnitus.   Eyes: Negative for blurred vision and double vision.  Respiratory: Positive for cough, sputum production and shortness of breath. Negative for hemoptysis, wheezing and stridor.   Cardiovascular: Negative for chest pain, palpitations, orthopnea, leg swelling and PND.  Gastrointestinal: Negative for abdominal pain, constipation, diarrhea, heartburn, nausea and vomiting.  Genitourinary: Negative for dysuria, hematuria and urgency.  Musculoskeletal: Negative for joint pain and myalgias.  Skin: Negative for itching and rash.  Neurological: Negative for dizziness, tingling, weakness and headaches.  Endo/Heme/Allergies: Negative for environmental allergies. Does not bruise/bleed easily.  Psychiatric/Behavioral: Negative for depression. The patient is not nervous/anxious and does not have insomnia.   All other systems reviewed and are negative.    Objective:  Physical Exam Vitals signs reviewed.  Constitutional:      General: He is not in acute distress.    Appearance: He is well-developed.  HENT:     Head: Normocephalic and atraumatic.     Mouth/Throat:     Pharynx: No oropharyngeal exudate.  Eyes:     Conjunctiva/sclera: Conjunctivae normal.     Pupils: Pupils are equal, round, and reactive to light.  Neck:     Vascular: No JVD.     Trachea: No tracheal  deviation.     Comments: Loss of supraclavicular fat Cardiovascular:     Rate and Rhythm: Normal rate and regular rhythm.     Heart sounds: S1 normal and S2 normal.     Comments: Distant heart tones Pulmonary:     Effort: No tachypnea or accessory muscle usage.     Breath sounds: No stridor. Decreased breath  sounds (throughout all lung fields) present. No wheezing, rhonchi or rales.  Abdominal:     General: Bowel sounds are normal. There is no distension.     Palpations: Abdomen is soft.     Tenderness: There is no abdominal tenderness.  Musculoskeletal:        General: Deformity (muscle wasting ) present.  Skin:    General: Skin is warm and dry.     Capillary Refill: Capillary refill takes less than 2 seconds.     Findings: No rash.  Neurological:     Mental Status: He is alert and oriented to person, place, and time.  Psychiatric:        Behavior: Behavior normal.      Vitals:   10/21/18 1527  BP: (!) 142/90  Pulse: 65  SpO2: 98%  Weight: 159 lb 9.6 oz (72.4 kg)  Height: 5\' 9"  (1.753 m)   98% on RA BMI Readings from Last 3 Encounters:  10/21/18 23.57 kg/m  06/05/18 22.74 kg/m  03/03/18 21.45 kg/m   Wt Readings from Last 3 Encounters:  10/21/18 159 lb 9.6 oz (72.4 kg)  06/05/18 154 lb (69.9 kg)  03/03/18 149 lb 7.6 oz (67.8 kg)     CBC    Component Value Date/Time   WBC 6.8 06/05/2018 1022   RBC 4.35 06/05/2018 1022   HGB 15.3 06/05/2018 1022   HCT 44.8 06/05/2018 1022   PLT 251 06/05/2018 1022   MCV 103.0 (H) 06/05/2018 1022   MCH 35.2 (H) 06/05/2018 1022   MCHC 34.2 06/05/2018 1022   RDW 13.2 06/05/2018 1022   LYMPHSABS 2.3 06/05/2018 1022   MONOABS 0.7 06/05/2018 1022   EOSABS 0.2 06/05/2018 1022   BASOSABS 0.1 06/05/2018 1022    Chest Imaging: 09/21/2018: Chest x-ray: Increased hyperinflated lung fields increased retrosternal airspace.  Consistent with COPD changes. The patient's images have been independently reviewed by me.    Pulmonary  Functions Testing Results: No flowsheet data found.  FeNO: None   Pathology: None   Echocardiogram: None   Heart Catheterization: None     Assessment & Plan:   Chronic obstructive pulmonary disease, unspecified COPD type (HCC) - Plan: Pulmonary function test  Cough  SOB (shortness of breath)  Cigarette smoker  Weight loss  Discussion:  58 year old gentleman longstanding history of smoking.  3 packs/day currently.  Has been smoking since age 66.  Presents today in the office with ongoing cough productive of white sputum.  He has lost a significant amount of weight approximately 20 pounds in the past 6 months this is been a slow gradual process.  He does have dyspnea on exertion.  He has no formal diagnosis of obstructive lung disease.  We will start with the following evaluation: We will obtain full pulmonary function test. Patient to return to clinic in 2 weeks to review these with the Elisha Headland, NP. Patient must quit smoking.  Today in the office we discussed various methods including tapering methods as well as the use of nicotine supplements lozenges or patches. We also discussed the use of Chantix.  Patient state was he on this in the past and did not tolerate this due to the abnormal thoughts that were generated and the way it made him feel. Should follow-up CT lung cancer screening but has been ordered for this Friday.  We will need to review these images.  We will also start him on Trelegy inhaler and he can continue the use of his albuterol for shortness  of breath and wheezing.  Return to clinic in 2 weeks.  Greater than 50% of this patient's 60-minute office visit was been face-to-face discussing above recommendations and treatment plan as well as counseling on smoking cessation.   Current Outpatient Medications:  .  albuterol (PROVENTIL) (2.5 MG/3ML) 0.083% nebulizer solution, Take 2.5 mg by nebulization every 6 (six) hours as needed.  , Disp: , Rfl:  .   amLODipine (NORVASC) 10 MG tablet, Take 10 mg by mouth daily., Disp: , Rfl: 0 .  buPROPion (WELLBUTRIN XL) 150 MG 24 hr tablet, Take 150 mg by mouth daily., Disp: , Rfl:  .  labetalol (NORMODYNE) 200 MG tablet, Take 1 tablet (200 mg total) by mouth 2 (two) times daily., Disp: 60 tablet, Rfl: 3 .  losartan (COZAAR) 100 MG tablet, Take 1 tablet (100 mg total) by mouth daily., Disp: 30 tablet, Rfl: 3 .  nicotine (NICODERM CQ - DOSED IN MG/24 HOURS) 21 mg/24hr patch, Place 1 patch (21 mg total) onto the skin daily., Disp: 28 patch, Rfl: 1 .  predniSONE (DELTASONE) 20 MG tablet, Take 2 tablets (40 mg total) by mouth daily., Disp: 10 tablet, Rfl: 0 .  triamterene-hydrochlorothiazide (MAXZIDE-25) 37.5-25 MG tablet, Take 1 tablet by mouth daily., Disp: 30 tablet, Rfl: 3 .  VENTOLIN HFA 108 (90 BASE) MCG/ACT inhaler, INHALE 2 PUFFS EVERY FOUR HOURS AS NEEDED FOR SHORTNESS OF BREATH, Disp: , Rfl: 3 .  omeprazole (PRILOSEC) 20 MG capsule, , Disp: , Rfl:  .  SPIRIVA HANDIHALER 18 MCG inhalation capsule, , Disp: , Rfl:  .  sucralfate (CARAFATE) 1 g tablet, , Disp: , Rfl:    Josephine Igo, DO [facility] 10/21/2018 3:50 PM

## 2018-10-22 ENCOUNTER — Telehealth: Payer: Self-pay | Admitting: Family Medicine

## 2018-10-23 ENCOUNTER — Ambulatory Visit (HOSPITAL_COMMUNITY): Admission: RE | Admit: 2018-10-23 | Payer: BC Managed Care – PPO | Source: Ambulatory Visit

## 2018-10-23 ENCOUNTER — Encounter (HOSPITAL_COMMUNITY): Payer: Self-pay

## 2018-11-09 ENCOUNTER — Ambulatory Visit: Payer: BC Managed Care – PPO | Admitting: Pulmonary Disease

## 2018-11-20 ENCOUNTER — Ambulatory Visit (HOSPITAL_COMMUNITY): Admission: RE | Admit: 2018-11-20 | Payer: BC Managed Care – PPO | Source: Ambulatory Visit

## 2019-01-06 ENCOUNTER — Ambulatory Visit (HOSPITAL_COMMUNITY): Admission: RE | Admit: 2019-01-06 | Payer: BC Managed Care – PPO | Source: Ambulatory Visit

## 2019-01-29 ENCOUNTER — Other Ambulatory Visit: Payer: Self-pay | Admitting: Pulmonary Disease

## 2019-02-01 ENCOUNTER — Other Ambulatory Visit (HOSPITAL_COMMUNITY): Admission: RE | Admit: 2019-02-01 | Payer: BC Managed Care – PPO | Source: Ambulatory Visit

## 2019-02-01 ENCOUNTER — Other Ambulatory Visit (HOSPITAL_COMMUNITY)
Admission: RE | Admit: 2019-02-01 | Discharge: 2019-02-01 | Disposition: A | Discharge status: Home / Self Care | Payer: BC Managed Care – PPO | Source: Ambulatory Visit | Attending: Pulmonary Disease | Admitting: Pulmonary Disease

## 2019-02-01 DIAGNOSIS — Z1159 Encounter for screening for other viral diseases: Secondary | ICD-10-CM | POA: Insufficient documentation

## 2019-02-01 DIAGNOSIS — Z01812 Encounter for preprocedural laboratory examination: Secondary | ICD-10-CM | POA: Diagnosis not present

## 2019-02-01 LAB — SARS CORONAVIRUS 2 BY RT PCR (HOSPITAL ORDER, PERFORMED IN ~~LOC~~ HOSPITAL LAB): SARS Coronavirus 2: NEGATIVE

## 2019-02-02 NOTE — Progress Notes (Signed)
@Patient  ID: Isaac Rose, male    DOB: [birth year], 59 y.o.   MRN: [id number]  Chief Complaint  Patient presents with  . Follow-up    COPD Smoker PFT     Referring provider: Redmond School, MD  HPI:  58 year old male current every day smoker followed in our office for COPD  PMH: Hypertension, headache Smoker/ Smoking History: Current Smoker. 3 packs a day. 144 pack years.  Maintenance:  Trelegy Ellipta  Pt of: Her Icard   02/03/2019  - Visit   58 year old male current every day smoker (3 packs/day) presenting to our office today as a follow-up visit and to complete a pulmonary function test.  Pulmonary function test results are listed below:  02/03/2019-pulmonary function test-FVC 4.57 (92% predicted), postbronchodilator ratio 70, postbronchodilator FEV1 3.35 (83% predicted), positive bronchodilator response in regards to FEV1, mid flow reversibility, DLCO 78  Patient continues to be maintained on Trelegy Ellipta inhaler.  He continues to smoke 3 packs/day and is not interested in stopping smoking at this time.  He uses his rescue inhaler 4-6 times daily specifically when he is working outside.  He reports that he has increased shortness of breath when doing hard physical labor such as lifting items and outside of the [place] heat and humidity.  MMRC - Breathlessness Score 2 - on level ground, I walk slower than people of the same age because of breathlessness, or have to stop for breathe when walking to my own pace     Tests:   09/21/2018: Chest x-ray: Increased hyperinflated lung fields increased retrosternal airspace.  Consistent with COPD changes.  02/03/2019-pulmonary function test-FVC 4.57 (92% predicted), postbronchodilator ratio 70, postbronchodilator FEV1 3.35 (83% predicted), positive bronchodilator response in regards to FEV1, mid flow reversibility, DLCO 78  SIX MIN WALK 02/03/2019  Supplimental Oxygen during Test? (L/min) No  Tech Comments: Pt tolerated  walk well. pmc     FENO:  No results found for: NITRICOXIDE  PFT: PFT Results Latest Ref Rng & Units 02/03/2019  FVC-Pre L 4.57  FVC-Predicted Pre % 92  FVC-Post L 4.81  FVC-Predicted Post % 96  Pre FEV1/FVC % % 59  Post FEV1/FCV % % 70  FEV1-Pre L 2.72  FEV1-Predicted Pre % 72  FEV1-Post L 3.35  DLCO UNC% % 78  DLCO COR %Predicted % 75  TLC L 8.51  TLC % Predicted % 118  RV % Predicted % 169    Imaging: No results found.    Specialty Problems      Pulmonary Problems   COPD (chronic obstructive pulmonary disease) ([place])    09/21/2018: Chest x-ray: Increased hyperinflated lung fields increased retrosternal airspace.  Consistent with COPD changes.  02/03/2019-pulmonary function test-FVC 4.57 (92% predicted), postbronchodilator ratio 70, postbronchodilator FEV1 3.35 (83% predicted), positive bronchodilator response in regards to FEV1, mid flow reversibility, DLCO 78         Allergies  Allergen Reactions  . Effexor [Venlafaxine Hydrochloride] Other (See Comments)    unknown    Immunization History  Administered Date(s) Administered  . Influenza Whole 06/05/2018  . Pneumococcal Polysaccharide-23 06/05/2018    States he had Pneumovax23 with PCP last year   Past Medical History:  Diagnosis Date  . Chronic constipation   . COPD (chronic obstructive pulmonary disease) ([place])   . Depression   . GERD (gastroesophageal reflux disease)   . Headache   . Hypertension     Tobacco History: Social History   Tobacco Use  Smoking Status Current Every Day  Smoker  . Packs/day: 3.00  . Years: 48.00  . Pack years: 144.00  . Start date: 271972  Smokeless Tobacco Former NeurosurgeonUser  . Types: Chew  . Quit date: 04/29/1987  Tobacco Comment   1 1/2 pack a day 35 yrs patient is aware he needs to quit    Ready to quit: No Counseling given: Yes Comment: 1 1/2 pack a day 34 yrs patient is aware he needs to quit   Smoking assessment and cessation counseling  Patient currently  smoking: 3 pk today  I have advised the patient to quit/stop smoking as soon as possible due to high risk for multiple medical problems.  It will also be very difficult for us to manage patient's  respiratory symptoms and status if we continue to expose her lungs to a known irritant.  We do not advise e-cigarettes as a form of stopping smoking.  Patient is not willing to quit smoking.  We reviewed FEV1 curve, discussed what can happen if patient does stop smoking such as flattening the curve and improvement of quality life, patient understands this we will refer to lung cancer screening program today.  Patient to contact our office if he starts to show interest in stopping smoking  I have advised the patient that we can assist and have options of nicotine replacement therapy, provided smoking cessation education today, provided smoking cessation counseling, and provided cessation resources.  Follow-up next office visit office visit for assessment of smoking cessation.  Smoking cessation counseling advised for: 12 min    Outpatient Encounter Medications as of 02/03/2019  Medication Sig  . albuterol (PROVENTIL) (2.5 MG/3ML) 0.083% nebulizer solution Take 2.5 mg by nebulization every 6 (six) hours as needed.    Marland Kitchen. amLODipine (NORVASC) 10 MG tablet Take 10 mg by mouth daily.  Marland Kitchen. buPROPion (WELLBUTRIN XL) 150 MG 24 hr tablet Take 150 mg by mouth daily.  Marland Kitchen. labetalol (NORMODYNE) 200 MG tablet Take 1 tablet (200 mg total) by mouth 2 (two) times daily.  Marland Kitchen. losartan (COZAAR) 100 MG tablet Take 1 tablet (100 mg total) by mouth daily.  Marland Kitchen. omeprazole (PRILOSEC) 20 MG capsule   . SPIRIVA HANDIHALER 18 MCG inhalation capsule   . triamterene-hydrochlorothiazide (MAXZIDE-25) 37.5-25 MG tablet Take 1 tablet by mouth daily.  . VENTOLIN HFA 108 (90 BASE) MCG/ACT inhaler INHALE 2 PUFFS EVERY FOUR HOURS AS NEEDED FOR SHORTNESS OF BREATH  . Fluticasone-Umeclidin-Vilant (TRELEGY ELLIPTA) 100-62.5-25 MCG/INH AEPB Inhale 1  puff into the lungs daily.  . nicotine (NICODERM CQ - DOSED IN MG/24 HOURS) 21 mg/24hr patch Place 1 patch (21 mg total) onto the skin daily. (Patient not taking: Reported on 02/03/2019)  . sucralfate (CARAFATE) 1 g tablet   . [DISCONTINUED] Fluticasone-Umeclidin-Vilant (TRELEGY ELLIPTA) 100-62.5-25 MCG/INH AEPB Inhale 1 puff into the lungs daily. (Patient not taking: Reported on 02/03/2019)  . [DISCONTINUED] predniSONE (DELTASONE) 20 MG tablet Take 2 tablets (40 mg total) by mouth daily. (Patient not taking: Reported on 02/03/2019)   No facility-administered encounter medications on file as of 02/03/2019.      Review of Systems  Review of Systems  Constitutional: Positive for fatigue. Negative for activity change, chills, fever and unexpected weight change.  HENT: Negative for postnasal drip, rhinorrhea, sinus pressure, sinus pain, sneezing and sore throat.   Eyes: Negative.   Respiratory: Positive for cough and shortness of breath. Negative for wheezing.   Cardiovascular: Negative for chest pain and palpitations.  Gastrointestinal: Negative for constipation, diarrhea, nausea and vomiting.  Endocrine: Negative.  Musculoskeletal: Negative.   Skin: Negative.   Neurological: Negative for dizziness and headaches.  Psychiatric/Behavioral: Negative.  Negative for dysphoric mood. The patient is not nervous/anxious.   All other systems reviewed and are negative.    Physical Exam  BP 128/74 (BP Location: Left Arm, Cuff Size: Normal)   Pulse 64   Temp 98.1 F (36.7 C) (Oral)   Ht 5\' 11"  (1.803 m)   Wt 159 lb (72.1 kg)   SpO2 100%   BMI 22.18 kg/m   Wt Readings from Last 5 Encounters:  02/03/19 159 lb (72.1 kg)  10/21/18 159 lb 9.6 oz (72.4 kg)  06/05/18 154 lb (69.9 kg)  03/03/18 149 lb 7.6 oz (67.8 kg)  07/29/17 156 lb (70.8 kg)     Physical Exam  Constitutional: He is oriented to person, place, and time and well-developed, well-nourished, and in no distress. No distress.  Thin  adult male  HENT:  Head: Normocephalic and atraumatic.  Right Ear: Hearing, tympanic membrane, external ear and ear canal normal.  Left Ear: Hearing, tympanic membrane, external ear and ear canal normal.  Nose: Nose normal. Right sinus exhibits no maxillary sinus tenderness and no frontal sinus tenderness. Left sinus exhibits no maxillary sinus tenderness and no frontal sinus tenderness.  Mouth/Throat: Uvula is midline and oropharynx is clear and moist. No oropharyngeal exudate.  Eyes: Pupils are equal, round, and reactive to light.  Neck: Normal range of motion. Neck supple.  Cardiovascular: Normal rate, regular rhythm and normal heart sounds.  Pulmonary/Chest: Effort normal and breath sounds normal. No accessory muscle usage. No respiratory distress. He has no decreased breath sounds. He has no wheezes. He has no rhonchi. He has no rales.  Diminished breath sounds throughout exam  Abdominal: Soft. Bowel sounds are normal. He exhibits no distension. There is no abdominal tenderness.  Musculoskeletal: Normal range of motion.        General: No edema.  Lymphadenopathy:    He has no cervical adenopathy.  Neurological: He is alert and oriented to person, place, and time. Gait normal.  Tolerated walk well in office today  Skin: Skin is warm and dry. He is not diaphoretic. No erythema.  Psychiatric: Mood, memory, affect and judgment normal.  Nursing note and vitals reviewed.     Lab Results:  CBC    Component Value Date/Time   WBC 6.8 06/05/2018 1022   RBC 4.35 06/05/2018 1022   HGB 15.3 06/05/2018 1022   HCT 44.8 06/05/2018 1022   PLT 251 06/05/2018 1022   MCV 103.0 (H) 06/05/2018 1022   MCH 35.2 (H) 06/05/2018 1022   MCHC 34.2 06/05/2018 1022   RDW 13.2 06/05/2018 1022   LYMPHSABS 2.3 06/05/2018 1022   MONOABS 0.7 06/05/2018 1022   EOSABS 0.2 06/05/2018 1022   BASOSABS 0.1 06/05/2018 1022    BMET    Component Value Date/Time   NA 139 06/05/2018 1022   K 3.6 06/05/2018  1022   CL 106 06/05/2018 1022   CO2 25 06/05/2018 1022   GLUCOSE 98 06/05/2018 1022   BUN 9 06/05/2018 1022   CREATININE 0.92 06/05/2018 1022   CALCIUM 10.1 06/05/2018 1022   GFRNONAA >60 06/05/2018 1022   GFRAA >60 06/05/2018 1022    BNP    Component Value Date/Time   BNP 30.0 06/05/2018 1022    ProBNP No results found for: PROBNP    Assessment & Plan:   COPD (chronic obstructive pulmonary disease) (HCC) Assessment: July/2020 pulmonary function test shows COPD Gold  1, DLCO 78 Patient continues to be an active smoker 3 packs/day Maintained well on Trelegy Ellipta inhaler Tolerated walk well in office today mMRC 2 Chest x-ray in February/2020 shows hyperinflated lung fields Breath sounds clear to auscultation today and slightly diminished  Plan: Continue Trelegy Ellipta, commercial insurance card provided for patient today Emphasized the importance of the patient repeatedly that he needs to stop smoking Continue rescue inhaler Referral to lung cancer screening program Patient reports that he received his pneumonia vaccine from primary care we will request those records Follow-up with our office in 2 months  Tobacco abuse Assessment: Current every day smoker, 3 packs/day 144-pack-year smoking history Hyperinflated lungs on chest x-ray No recent CT imaging July/2020 pulmonary function test shows COPD Gold 1, DLCO 78  Plan: Strongly encourage patient today repeatedly to stop smoking, he is not interested at this time Reviewed FEV1 information regarding stopping smoking Patient knows to contact our office if he decides he would like to work on stopping smoking Patient aware that stopping smoking is the most important thing for him to do right now for management of his breathing and COPD Referral to lung cancer screening program today  Healthcare maintenance Plan: Will check with primary care office to ensure patient has had pneumonia vaccine if not patient needs  at next office visit Patient to get flu vaccine in fall    Return in about 2 months (around 04/06/2019), or if symptoms worsen or fail to improve, for Follow up with Elisha HeadlandBrian Mazal Ebey FNP-C, Follow up with Dr. Tonia BroomsIcard.   Coral CeoBrian P Nezzie Manera, NP 02/03/2019   This appointment was 32 minutes long with over 50% of the time in direct face-to-face patient care, assessment, plan of care, and follow-up.

## 2019-02-03 ENCOUNTER — Encounter: Payer: Self-pay | Admitting: Pulmonary Disease

## 2019-02-03 ENCOUNTER — Other Ambulatory Visit: Payer: Self-pay

## 2019-02-03 ENCOUNTER — Ambulatory Visit: Payer: BC Managed Care – PPO | Admitting: Pulmonary Disease

## 2019-02-03 ENCOUNTER — Ambulatory Visit (INDEPENDENT_AMBULATORY_CARE_PROVIDER_SITE_OTHER): Payer: BC Managed Care – PPO | Admitting: Pulmonary Disease

## 2019-02-03 ENCOUNTER — Telehealth: Payer: Self-pay | Admitting: Pulmonary Disease

## 2019-02-03 VITALS — BP 128/74 | HR 64 | Temp 98.1°F | Ht 71.0 in | Wt 159.0 lb

## 2019-02-03 DIAGNOSIS — J449 Chronic obstructive pulmonary disease, unspecified: Secondary | ICD-10-CM | POA: Diagnosis not present

## 2019-02-03 DIAGNOSIS — Z0000 Encounter for general adult medical examination without abnormal findings: Secondary | ICD-10-CM | POA: Diagnosis not present

## 2019-02-03 DIAGNOSIS — Z720 Tobacco use: Secondary | ICD-10-CM | POA: Diagnosis not present

## 2019-02-03 LAB — PULMONARY FUNCTION TEST
DL/VA % pred: 75 %
DL/VA: 3.18 ml/min/mmHg/L
DLCO unc % pred: 78 %
DLCO unc: 22.53 ml/min/mmHg
FEF 25-75 Post: 2.76 L/sec
FEF 25-75 Pre: 2.03 L/sec
FEF2575-%Change-Post: 36 %
FEF2575-%Pred-Post: 88 %
FEF2575-%Pred-Pre: 65 %
FEV1-%Change-Post: 23 %
FEV1-%Pred-Post: 88 %
FEV1-%Pred-Pre: 72 %
FEV1-Post: 3.35 L
FEV1-Pre: 2.72 L
FEV1FVC-%Change-Post: 16 %
FEV1FVC-%Pred-Pre: 78 %
FEV6-%Change-Post: 3 %
FEV6-%Pred-Post: 98 %
FEV6-%Pred-Pre: 95 %
FEV6-Post: 4.7 L
FEV6-Pre: 4.55 L
FEV6FVC-%Change-Post: -1 %
FEV6FVC-%Pred-Post: 102 %
FEV6FVC-%Pred-Pre: 104 %
FVC-%Change-Post: 5 %
FVC-%Pred-Post: 96 %
FVC-%Pred-Pre: 92 %
FVC-Post: 4.81 L
FVC-Pre: 4.57 L
Post FEV1/FVC ratio: 70 %
Post FEV6/FVC ratio: 98 %
Pre FEV1/FVC ratio: 59 %
Pre FEV6/FVC Ratio: 100 %
RV % pred: 169 %
RV: 3.86 L
TLC % pred: 118 %
TLC: 8.51 L

## 2019-02-03 MED ORDER — TRELEGY ELLIPTA 100-62.5-25 MCG/INH IN AEPB: 1.0000 | INHALATION_SPRAY | Freq: Every day | RESPIRATORY_TRACT | 0 refills | Status: DC

## 2019-02-03 NOTE — Assessment & Plan Note (Signed)
Assessment: July/2020 pulmonary function test shows COPD Gold 1, DLCO 78 Patient continues to be an active smoker 3 packs/day Maintained well on Trelegy Ellipta inhaler Tolerated walk well in office today mMRC 2 Chest x-ray in February/2020 shows hyperinflated lung fields Breath sounds clear to auscultation today and slightly diminished  Plan: Continue Trelegy Ellipta, commercial insurance card provided for patient today Emphasized the importance of the patient repeatedly that he needs to stop smoking Continue rescue inhaler Referral to lung cancer screening program Patient reports that he received his pneumonia vaccine from primary care we will request those records Follow-up with our office in 2 months

## 2019-02-03 NOTE — Patient Instructions (Addendum)
Walk today   We will check with your PCP about your pneumovax23 records   Get flu vaccine in fall   Trelegy Ellipta  >>> 1 puff daily in the morning >>>rinse mouth out after use  >>> This inhaler contains 3 medications that help manage her respiratory status, contact our office if you cannot afford this medication or unable to remain on this medication  Only use your albuterol as a rescue medication to be used if you can't catch your breath by resting or doing a relaxed purse lip breathing pattern.  - The less you use it, the better it will work when you need it. - Ok to use up to 2 puffs  every 4 hours if you must but call for immediate appointment if use goes up over your usual need - Don't leave home without it !!  (think of it like the spare tire for your car)     Note your daily symptoms > remember "red flags" for COPD:   >>>Increase in cough >>>increase in sputum production >>>increase in shortness of breath or activity  intolerance.   If you notice these symptoms, please call the office to be seen.     We recommend that you stop smoking.  >>>You need to set a quit date >>>If you have friends or family who smoke, let them know you are trying to quit and not to smoke around you or in your living environment  Smoking Cessation Resources:  1 800 QUIT NOW  >>> Patient to call this resource and utilize it to help support her quit smoking >>> Keep up your hard work with stopping smoking  You can also contact the [facility] >>>For smoking cessation classes call [phone]  We do not recommend using e-cigarettes as a form of stopping smoking  You can sign up for smoking cessation support texts and information:  >>>https://smokefree.gov/smokefreetxt   We will refer you today to her lung cancer screening program >>>This is based off of your 144 pack-year smoking history >>> This is a recommendation from the [place] preventative services task force  (USPSTF) >>>The USPSTF recommends annual screening for lung cancer with low-dose computed tomography (LDCT) in adults aged 4 to 54 years who have a 30 pack-year smoking history and currently smoke or have quit within the past 15 years. Screening should be discontinued once a person has not smoked for 15 years or develops a health problem that substantially limits life expectancy or the ability or willingness to have curative lung surgery.   Our office will call you and set up an appointment with Eric Form (Nurse Practitioner) who leads this program.  This appointment takes place in our office.  After completing this meeting with Eric Form NP you will get a low-dose CT as the screening >>>We will call you with those results    Return in about 2 months (around 04/06/2019), or if symptoms worsen or fail to improve, for Follow up with Wyn Quaker FNP-C, Follow up with Dr. Valeta Harms.   Coronavirus (COVID-19) Are you at risk?  Are you at risk for the Coronavirus (COVID-19)?  To be considered HIGH RISK for Coronavirus (COVID-19), you have to meet the following criteria:  . Traveled to [place], [place], [place], [place] or [place]; or in the [place] to [place], [place], [place], or [place]; and have fever, cough, and shortness of breath within the last 2 weeks of travel OR . Been in close contact with a person diagnosed  with COVID-19 within the last 2 weeks and have fever, cough, and shortness of breath . IF YOU DO NOT MEET THESE CRITERIA, YOU ARE CONSIDERED LOW RISK FOR COVID-19.  What to do if you are HIGH RISK for COVID-19?  Marland Kitchen If you are having a medical emergency, call 911. . Seek medical care right away. Before you go to a doctor's office, urgent care or emergency department, call ahead and tell them about your recent travel, contact with someone diagnosed with COVID-19, and your symptoms. You should receive instructions from your physician's office regarding next steps of care.   . When you arrive at healthcare provider, tell the healthcare staff immediately you have returned from visiting [place], [place], [place], [place] or [place]; or traveled in the [place] to [place], [place], [place], or [place]; in the last two weeks or you have been in close contact with a person diagnosed with COVID-19 in the last 2 weeks.   . Tell the health care staff about your symptoms: fever, cough and shortness of breath. . After you have been seen by a medical provider, you will be either: o Tested for (COVID-19) and discharged home on quarantine except to seek medical care if symptoms worsen, and asked to  - Stay home and avoid contact with others until you get your results (4-5 days)  - Avoid travel on public transportation if possible (such as bus, train, or airplane) or o Sent to the Emergency Department by EMS for evaluation, COVID-19 testing, and possible admission depending on your condition and test results.  What to do if you are LOW RISK for COVID-19?  Reduce your risk of any infection by using the same precautions used for avoiding the common cold or flu:  Marland Kitchen Wash your hands often with soap and warm water for at least 20 seconds.  If soap and water are not readily available, use an alcohol-based hand sanitizer with at least 60% alcohol.  . If coughing or sneezing, cover your mouth and nose by coughing or sneezing into the elbow areas of your shirt or coat, into a tissue or into your sleeve (not your hands). . Avoid shaking hands with others and consider head nods or verbal greetings only. . Avoid touching your eyes, nose, or mouth with unwashed hands.  . Avoid close contact with people who are sick. . Avoid places or events with large numbers of people in one location, like concerts or sporting events. . Carefully consider travel plans you have or are making. . If you are planning any travel outside or inside the [place], visit the CDC's Travelers' Health webpage for the  latest health notices. . If you have some symptoms but not all symptoms, continue to monitor at home and seek medical attention if your symptoms worsen. . If you are having a medical emergency, call 911.   ADDITIONAL HEALTHCARE OPTIONS FOR PATIENTS   Telehealth / e-Visit: [email or web address]         [facility]: [phone]  [facility]: [phone]                   [facility]: [phone]           It is flu season:   >>> Best ways to protect herself from the flu: Receive the yearly flu vaccine, practice good hand hygiene washing with soap and also using hand sanitizer when available, eat a nutritious meals, get adequate rest, hydrate appropriately  Please contact the office if your symptoms worsen or you have concerns that you are not improving.   Thank you for choosing [facility] for your healthcare, and for allowing [place] to partner with you on your healthcare journey. I am thankful to be able to provide care to you today.   Elisha Headland FNP-C     Chronic Obstructive Pulmonary Disease Chronic obstructive pulmonary disease (COPD) is a long-term (chronic) lung problem. When you have COPD, it is hard for air to get in and out of your lungs. Usually the condition gets worse over time, and your lungs will never return to normal. There are things you can do to keep yourself as healthy as possible.  Your doctor may treat your condition with: ? Medicines. ? Oxygen. ? Lung surgery.  Your doctor may also recommend: ? Rehabilitation. This includes steps to make your body work better. It may involve a team of specialists. ? Quitting smoking, if you smoke. ? Exercise and changes to your diet. ? Comfort measures (palliative care). Follow these instructions at home: Medicines  Take over-the-counter and prescription medicines only as told by your doctor.  Talk to your  doctor before taking any cough or allergy medicines. You may need to avoid medicines that cause your lungs to be dry. Lifestyle  If you smoke, stop. Smoking makes the problem worse. If you need help quitting, ask your doctor.  Avoid being around things that make your breathing worse. This may include smoke, chemicals, and fumes.  Stay active, but remember to rest as well.  Learn and use tips on how to relax.  Make sure you get enough sleep. Most adults need at least 7 hours of sleep every night.  Eat healthy foods. Eat smaller meals more often. Rest before meals. Controlled breathing Learn and use tips on how to control your breathing as told by your doctor. Try:  Breathing in (inhaling) through your nose for 1 second. Then, pucker your lips and breath out (exhale) through your lips for 2 seconds.  Putting one hand on your belly (abdomen). Breathe in slowly through your nose for 1 second. Your hand on your belly should move out. Pucker your lips and breathe out slowly through your lips. Your hand on your belly should move in as you breathe out.  Controlled coughing Learn and use controlled coughing to clear mucus from your lungs. Follow these steps: 1. Lean your head a little forward. 2. Breathe in deeply. 3. Try to hold your breath for 3 seconds. 4. Keep your mouth slightly open while coughing 2 times. 5. Spit any mucus out into a tissue. 6. Rest and do the steps again 1 or 2 times as needed. General instructions  Make sure you get all the shots (vaccines) that your doctor recommends. Ask your doctor about a flu shot and a pneumonia shot.  Use oxygen therapy and pulmonary rehabilitation if told by your doctor. If you need home oxygen therapy, ask your doctor if you should buy a tool to measure your oxygen level (oximeter).  Make a COPD action plan with your doctor. This helps you to know what to do if you feel worse than usual.  Manage any other conditions you have as told by  your doctor.  Avoid going outside when it is very hot, cold, or humid.  Avoid people who have a sickness you can catch (contagious).  Keep all follow-up visits as told by your doctor. This is important. Contact a doctor if:  You cough up more mucus than usual.  There is a change in the color or thickness of the mucus.  It is harder to breathe than usual.  Your breathing is faster than usual.  You have trouble sleeping.  You need to use your medicines more often than usual.  You have trouble doing your normal activities such as getting dressed or walking around the house. Get help right away if:  You have shortness of breath while resting.  You have shortness of breath that stops you from: ? Being able to talk. ? Doing normal activities.  Your chest hurts for longer than 5 minutes.  Your skin color is more blue than usual.  Your pulse oximeter shows that you have low oxygen for longer than 5 minutes.  You have a fever.  You feel too tired to breathe normally. Summary  Chronic obstructive pulmonary disease (COPD) is a long-term lung problem.  The way your lungs work will never return to normal. Usually the condition gets worse over time. There are things you can do to keep yourself as healthy as possible.  Take over-the-counter and prescription medicines only as told by your doctor.  If you smoke, stop. Smoking makes the problem worse. This information is not intended to replace advice given to you by your health care provider. Make sure you discuss any questions you have with your health care provider. Document Released: 01/08/2008 Document Revised: 07/04/2017 Document Reviewed: 08/26/2016 Elsevier Patient Education  2020 Elsevier Inc.     Pneumococcal Vaccine, Polyvalent solution for injection What is this medicine? PNEUMOCOCCAL VACCINE, POLYVALENT (NEU mo KOK al vak SEEN, pol ee VEY luhnt) is a vaccine to prevent pneumococcus bacteria infection. These  bacteria are a major cause of ear infections, Strep throat infections, and serious pneumonia, meningitis, or blood infections worldwide. These vaccines help the body to produce antibodies (protective substances) that help your body defend against these bacteria. This vaccine is recommended for people 322 years of age and older with health problems. It is also recommended for all adults over 59 years old. This vaccine will not treat an infection. This medicine may be used for other purposes; ask your health care provider or pharmacist if you have questions. COMMON BRAND NAME(S): Pneumovax 23 What should I tell my health care provider before I take this medicine? They need to know if you have any of these conditions:  bleeding problems  bone marrow or organ transplant  cancer, Hodgkin's disease  fever  infection  immune system problems  low platelet count in the blood  seizures  an unusual or allergic reaction to pneumococcal vaccine, diphtheria toxoid, other vaccines, latex, other medicines, foods, dyes, or preservatives  pregnant or trying to get pregnant  breast-feeding How should I use this medicine? This vaccine is for injection into a muscle or under the skin. It is given by a health care professional. A copy of Vaccine Information Statements will be given before each vaccination. Read this sheet carefully each time. The sheet may change frequently. Talk to your pediatrician regarding the use of this medicine in children. While this drug may be prescribed for children as young as 762 years of age for selected conditions, precautions do apply. Overdosage: If you think you have taken too much of this medicine contact a poison control center or emergency room at once. NOTE: This medicine is only for you. Do not share this medicine with others. What if I miss a dose? It is important not to miss  your dose. Call your doctor or health care professional if you are unable to keep an  appointment. What may interact with this medicine?  medicines for cancer chemotherapy  medicines that suppress your immune function  medicines that treat or prevent blood clots like warfarin, enoxaparin, and dalteparin  steroid medicines like prednisone or cortisone This list may not describe all possible interactions. Give your health care provider a list of all the medicines, herbs, non-prescription drugs, or dietary supplements you use. Also tell them if you smoke, drink alcohol, or use illegal drugs. Some items may interact with your medicine. What should I watch for while using this medicine? Mild fever and pain should go away in 3 days or less. Report any unusual symptoms to your doctor or health care professional. What side effects may I notice from receiving this medicine? Side effects that you should report to your doctor or health care professional as soon as possible:  allergic reactions like skin rash, itching or hives, swelling of the face, lips, or tongue  breathing problems  confused  fever over 102 degrees F  pain, tingling, numbness in the hands or feet  seizures  unusual bleeding or bruising  unusual muscle weakness Side effects that usually do not require medical attention (report to your doctor or health care professional if they continue or are bothersome):  aches and pains  diarrhea  fever of 102 degrees F or less  headache  irritable  loss of appetite  pain, tender at site where injected  trouble sleeping This list may not describe all possible side effects. Call your doctor for medical advice about side effects. You may report side effects to FDA at 1-800-FDA-1088. Where should I keep my medicine? This does not apply. This vaccine is given in a clinic, pharmacy, doctor's office, or other health care setting and will not be stored at home. NOTE: This sheet is a summary. It may not cover all possible information. If you have questions about  this medicine, talk to your doctor, pharmacist, or health care provider.  2020 Elsevier/Gold Standard (2008-02-26 14:32:37)    Health Risks of Smoking Smoking cigarettes is very bad for your health. Tobacco smoke has over 200 known poisons in it. It contains the poisonous gases nitrogen oxide and carbon monoxide. There are over 60 chemicals in tobacco smoke that cause cancer. Smoking is difficult to quit because a chemical in tobacco, called nicotine, causes addiction or dependence. When you smoke and inhale, nicotine is absorbed rapidly into the bloodstream through your lungs. Both inhaled and non-inhaled nicotine may be addictive. What are the risks of cigarette smoke? Cigarette smokers have an increased risk of many serious medical problems, including:  Lung cancer.  Lung disease, such as pneumonia, bronchitis, and emphysema.  Chest pain (angina) and heart attack because the heart is not getting enough oxygen.  Heart disease and peripheral blood vessel disease.  High blood pressure (hypertension).  Stroke.  Oral cancer, including cancer of the lip, mouth, or voice box.  Bladder cancer.  Pancreatic cancer.  Cervical cancer.  Pregnancy complications, including premature birth.  Stillbirths and smaller newborn babies, birth defects, and genetic damage to sperm.  Early menopause.  Lower estrogen level for women.  Infertility.  Facial wrinkles.  Blindness.  Increased risk of broken bones (fractures).  Senile dementia.  Stomach ulcers and internal bleeding.  Delayed wound healing and increased risk of complications during surgery.  Even smoking lightly shortens your life expectancy by several years. Because of secondhand  smoke exposure, children of smokers have an increased risk of the following:  Sudden infant death syndrome (SIDS).  Respiratory infections.  Lung cancer.  Heart disease.  Ear infections. What are the benefits of quitting? There are many  health benefits of quitting smoking. Here are some of them:  Within days of quitting smoking, your risk of having a heart attack decreases, your blood flow improves, and your lung capacity improves. Blood pressure, pulse rate, and breathing patterns start returning to normal soon after quitting.  Within months, your lungs may clear up completely.  Quitting for 10 years reduces your risk of developing lung cancer and heart disease to almost that of a nonsmoker.  People who quit may see an improvement in their overall quality of life. How do I quit smoking?     Smoking is an addiction with both physical and psychological effects, and longtime habits can be hard to change. Your health care provider can recommend:  Programs and community resources, which may include group support, education, or talk therapy.  Prescription medicines to help reduce cravings.  Nicotine replacement products, such as patches, gum, and nasal sprays. Use these products only as directed. Do not replace cigarette smoking with electronic cigarettes, which are commonly called e-cigarettes. The safety of e-cigarettes is not known, and some may contain harmful chemicals.  A combination of two or more of these methods. Where to find more information  American Lung Association: www.lung.org  American Cancer Society: www.cancer.org Summary  Smoking cigarettes is very bad for your health. Cigarette smokers have an increased risk of many serious medical problems, including several cancers, heart disease, and stroke.  Smoking is an addiction with both physical and psychological effects, and longtime habits can be hard to change.  By stopping right away, you can greatly reduce the risk of medical problems for you and your family.  To help you quit smoking, your health care provider can recommend programs, community resources, prescription medicines, and nicotine replacement products such as patches, gum, and nasal  sprays. This information is not intended to replace advice given to you by your health care provider. Make sure you discuss any questions you have with your health care provider. Document Released: 08/29/2004 Document Revised: 10/23/2017 Document Reviewed: 07/26/2016 Elsevier Patient Education  2020 [employer]

## 2019-02-03 NOTE — Assessment & Plan Note (Signed)
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

## 2019-02-03 NOTE — Progress Notes (Signed)
Full PFT performed today. °

## 2019-02-03 NOTE — Telephone Encounter (Signed)
02/03/2019 1437  [name], please request records from patient's primary care office regarding pneumonia vaccine status.  Wyn Quaker, FNP

## 2019-02-03 NOTE — Assessment & Plan Note (Signed)
Plan: Will check with primary care office to ensure patient has had pneumonia vaccine if not patient needs at next office visit Patient to get flu vaccine in fall

## 2019-02-04 NOTE — Progress Notes (Signed)
PCCM: Agree. He must quit smoking.  Garner Nash, DO [facility] 02/04/2019 10:26 AM

## 2019-02-08 NOTE — Telephone Encounter (Signed)
Thank you   B

## 2019-02-08 NOTE — Telephone Encounter (Signed)
Called Dr. Nolon Rod office and spoke with nurse and was advised that patient has his Pneumovax 06/2018. This has already been entered in patient's chart.   Will send to [place] as FYI.

## 2019-02-22 ENCOUNTER — Other Ambulatory Visit: Payer: Self-pay | Admitting: *Deleted

## 2019-02-22 DIAGNOSIS — F17210 Nicotine dependence, cigarettes, uncomplicated: Secondary | ICD-10-CM

## 2019-02-22 DIAGNOSIS — Z122 Encounter for screening for malignant neoplasm of respiratory organs: Secondary | ICD-10-CM

## 2019-03-15 ENCOUNTER — Ambulatory Visit
Admission: RE | Admit: 2019-03-15 | Discharge: 2019-03-15 | Disposition: A | Discharge status: Home / Self Care | Payer: BC Managed Care – PPO | Source: Ambulatory Visit | Attending: Acute Care | Admitting: Acute Care

## 2019-03-15 ENCOUNTER — Encounter: Payer: Self-pay | Admitting: Acute Care

## 2019-03-15 ENCOUNTER — Ambulatory Visit (INDEPENDENT_AMBULATORY_CARE_PROVIDER_SITE_OTHER): Payer: BC Managed Care – PPO | Admitting: Acute Care

## 2019-03-15 ENCOUNTER — Other Ambulatory Visit: Payer: Self-pay

## 2019-03-15 VITALS — BP 128/78 | HR 57 | Temp 98.2°F | Ht 71.0 in | Wt 154.6 lb

## 2019-03-15 DIAGNOSIS — Z122 Encounter for screening for malignant neoplasm of respiratory organs: Secondary | ICD-10-CM

## 2019-03-15 DIAGNOSIS — F17210 Nicotine dependence, cigarettes, uncomplicated: Secondary | ICD-10-CM | POA: Diagnosis not present

## 2019-03-15 DIAGNOSIS — J441 Chronic obstructive pulmonary disease with (acute) exacerbation: Secondary | ICD-10-CM

## 2019-03-15 NOTE — Progress Notes (Signed)
Shared Decision Making Visit Lung Cancer Screening Program [phone]([id number])   Eligibility:  Age 58 y.o.  Pack Years Smoking History Calculation: 90 pack year smoking history  (# packs/per year x # years smoked)  Recent History of coughing up blood  no  Unexplained weight loss? no ( >Than 15 pounds within the last 6 months )  Prior History Lung / other cancer no (Diagnosis within the last 5 years already requiring surveillance chest CT Scans).  Smoking Status Current Smoker  Former Smokers: Years since quit: NA  Quit Date: NA  Visit Components:  Discussion included one or more decision making aids. yes  Discussion included risk/benefits of screening. yes  Discussion included potential follow up diagnostic testing for abnormal scans. yes  Discussion included meaning and risk of over diagnosis. yes  Discussion included meaning and risk of False Positives. yes  Discussion included meaning of total radiation exposure. yes  Counseling Included:  Importance of adherence to annual lung cancer LDCT screening. yes  Impact of comorbidities on ability to participate in the program. yes  Ability and willingness to under diagnostic treatment. yes  Smoking Cessation Counseling:  Current Smokers:   Discussed importance of smoking cessation. yes  Information about tobacco cessation classes and interventions provided to patient. yes  Patient provided with "ticket" for LDCT Scan. yes  Symptomatic Patient. no  CounselingNA  Diagnosis Code: Tobacco Use Z72.0  Asymptomatic Patient yes  Counseling (Intermediate counseling: > three minutes counseling) [id number]  Former Smokers:   Discussed the importance of maintaining cigarette abstinence. yes  Diagnosis Code: Personal History of Nicotine Dependence. Q46.962Z87.891  Information about tobacco cessation classes and interventions provided to patient. Yes  Patient provided with "ticket" for LDCT Scan. yes  Written Order for Lung Cancer  Screening with LDCT placed in Epic. Yes (CT Chest Lung Cancer Screening Low Dose W/O CM) [id number] Z12.2-Screening of respiratory organs Z87.891-Personal history of nicotine dependence  I have spent 25 minutes of face to face time with Mr. Isaac Polingorwood discussing the risks and benefits of lung cancer screening. We viewed a power point together that explained in detail the above noted topics. We paused at intervals to allow for questions to be asked and answered to ensure understanding.We discussed that the single most powerful action that he can take to decrease his risk of developing lung cancer is to quit smoking. We discussed whether or not he is ready to commit to setting a quit date. We discussed options for tools to aid in quitting smoking including nicotine replacement therapy, non-nicotine medications, support groups, Quit Smart classes, and behavior modification. We discussed that often times setting smaller, more achievable goals, such as eliminating 1 cigarette a day for a week and then 2 cigarettes a day for a week can be helpful in slowly decreasing the number of cigarettes smoked. This allows for a sense of accomplishment as well as providing a clinical benefit. I gave him  the " Be Stronger Than Your Excuses" card with contact information for community resources, classes, free nicotine replacement therapy, and access to mobile apps, text messaging, and on-line smoking cessation help. I have also given him my card and contact information in the event he needs to contact me. We discussed the time and location of the scan, and that either Abigail Miyamotoenise Phelps RN or I will call with the results within 24-48 hours of receiving them. I have offered him  a copy of the power point we viewed  as a resource in the event they need  reinforcement of the concepts we discussed today in the office. The patient verbalized understanding of all of  the above and had no further questions upon leaving the office. They have my  contact information in the event they have any further questions.  BP 128/78 (BP Location: Left Arm, Cuff Size: Normal)   Pulse (!) 57   Temp 98.2 F (36.8 C) (Oral)   Ht 5\' 11"  (1.803 m)   Wt 154 lb 9.6 oz (70.1 kg)   SpO2 99%   BMI 21.56 kg/m    I spent 3 minutes counseling on smoking cessation and the health risks of continued tobacco abuse.  I explained to the patient that there has been a high incidence of coronary artery disease noted on these exams. I explained that this is a non-gated exam therefore degree or severity cannot be determined. This patient is not currently on statin therapy. I have asked the patient to follow-up with their PCP regarding any incidental finding of coronary artery disease and management with diet or medication as their PCP  feels is clinically indicated. The patient verbalized understanding of the above and had no further questions upon completion of the visit.      Magdalen Spatz, NP 03/15/2019 4:27 PM

## 2019-03-16 NOTE — Progress Notes (Signed)
Synopsis: Referred in March 2020 for cough by Redmond School, MD  Subjective:   PATIENT ID: Isaac Rose GENDER: male DOB: 26-May-1961, MRN: [id number]  Chief Complaint  Patient presents with  . Follow-up    F/U re: COPD. He reports the humidity makes his SOB worse but overall his breathing has been good. Currently using Trelegy daily and albuterol nebs prn. Still smoking.     C/o cough that's been going since December. He has been having mildly productive cough. The cough just started one day. He has been on 2 rounds of abx and prednisone. Current smoker since age 20 years currently smokes 3 packs/day.Marland Kitchen He has lost some weight. Only eating one meal per day. He has lost about 20 lbs. He was wearing a waist 34 and now down to a 29. The weight loss has been going on for the past 6 months. Mother died of lung cancer. Father with emphysema. Both died from these disease.  Patient denies hemoptysis.  Denies fevers. 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  He has 144-pack-year history of smoking.  Currently smoking 3 packs a day.  Patient was seen 03/15/2019 by Eric Form for lung cancer screening program.  Patient is with low-dose lung cancer screening CT completed lung RADS 2. Currently smoking and almost smoked a pack this morning already. Usually 2-3 packs per day. He lives by himself and feels like he smokes a lot alone.  Patient still complains of feeling short of breath with exertion.  He seemingly has no interest in quitting smoking.  We spent most of our time talking about smoking cessation options.   Past Medical History:  Diagnosis Date  . Chronic constipation   . COPD (chronic obstructive pulmonary disease) ([place])   . Depression   . GERD (gastroesophageal reflux disease)   . Headache   . Hypertension      Family History  Problem Relation Age of Onset  . Lung cancer Mother   . Emphysema  Father   . Kidney cancer Sister   . Heart disease Brother   . Aneurysm Sister   . Breast cancer Sister   . Colon cancer Maternal Aunt   . Colon cancer Maternal Uncle        x4     Past Surgical History:  Procedure Laterality Date  . APPENDECTOMY    . COLONOSCOPY  08   NUR  . head trauma     from trauma  . UPPER GASTROINTESTINAL ENDOSCOPY      Social History   Socioeconomic History  . Marital status: Legally Separated    Spouse name: Not on file  . Number of children: Not on file  . Years of education: Not on file  . Highest education level: Not on file  Occupational History  . Not on file  Social Needs  . Financial resource strain: Not on file  . Food insecurity    Worry: Not on file    Inability: Not on file  . Transportation needs    Medical: Not on file    Non-medical: Not on file  Tobacco Use  . Smoking status: Current Every Day Smoker    Packs/day: 3.00    Years: 48.00    Pack years: 144.00    Types: Cigarettes    Start date: 81  . Smokeless tobacco: Former Systems developer    Types: Chew    Quit date: 04/29/1987  .  Tobacco comment: 1 1/2 pack a day 34 yrs patient is aware he needs to quit   Substance and Sexual Activity  . Alcohol use: Yes    Alcohol/week: 0.0 standard drinks    Comment: occasionally  . Drug use: No  . Sexual activity: Never    Partners: Female  Lifestyle  . Physical activity    Days per week: Not on file    Minutes per session: Not on file  . Stress: Not on file  Relationships  . Social Musicianconnections    Talks on phone: Not on file    Gets together: Not on file    Attends religious service: Not on file    Active member of club or organization: Not on file    Attends meetings of clubs or organizations: Not on file    Relationship status: Not on file  . Intimate partner violence    Fear of current or ex partner: Not on file    Emotionally abused: Not on file    Physically abused: Not on file    Forced sexual activity: Not on file   Other Topics Concern  . Not on file  Social History Narrative  . Not on file     Allergies  Allergen Reactions  . Effexor [Venlafaxine Hydrochloride] Other (See Comments)    unknown     Outpatient Medications Prior to Visit  Medication Sig Dispense Refill  . albuterol (PROVENTIL) (2.5 MG/3ML) 0.083% nebulizer solution Take 2.5 mg by nebulization every 6 (six) hours as needed.      Marland Kitchen. amLODipine (NORVASC) 10 MG tablet Take 10 mg by mouth daily.  0  . buPROPion (WELLBUTRIN XL) 150 MG 24 hr tablet Take 150 mg by mouth daily.    . Fluticasone-Umeclidin-Vilant (TRELEGY ELLIPTA) 100-62.5-25 MCG/INH AEPB Inhale 1 puff into the lungs daily. 2 each 0  . labetalol (NORMODYNE) 200 MG tablet Take 1 tablet (200 mg total) by mouth 2 (two) times daily. 60 tablet 3  . losartan (COZAAR) 100 MG tablet Take 1 tablet (100 mg total) by mouth daily. 30 tablet 3  . omeprazole (PRILOSEC) 20 MG capsule     . SPIRIVA HANDIHALER 18 MCG inhalation capsule     . sucralfate (CARAFATE) 1 g tablet     . triamterene-hydrochlorothiazide (MAXZIDE-25) 37.5-25 MG tablet Take 1 tablet by mouth daily. 30 tablet 3  . VENTOLIN HFA 108 (90 BASE) MCG/ACT inhaler INHALE 2 PUFFS EVERY FOUR HOURS AS NEEDED FOR SHORTNESS OF BREATH  3  . nicotine (NICODERM CQ - DOSED IN MG/24 HOURS) 21 mg/24hr patch Place 1 patch (21 mg total) onto the skin daily. (Patient not taking: Reported on 03/17/2019) 28 patch 1   No facility-administered medications prior to visit.     Review of Systems  Constitutional: Negative for chills, fever, malaise/fatigue and weight loss.  HENT: Negative for hearing loss, sore throat and tinnitus.   Eyes: Negative for blurred vision and double vision.  Respiratory: Positive for cough, sputum production and shortness of breath. Negative for hemoptysis, wheezing and stridor.   Cardiovascular: Negative for chest pain, palpitations, orthopnea, leg swelling and PND.  Gastrointestinal: Negative for abdominal pain,  constipation, diarrhea, heartburn, nausea and vomiting.  Genitourinary: Negative for dysuria, hematuria and urgency.  Musculoskeletal: Negative for joint pain and myalgias.  Skin: Negative for itching and rash.  Neurological: Negative for dizziness, tingling, weakness and headaches.  Endo/Heme/Allergies: Negative for environmental allergies. Does not bruise/bleed easily.  Psychiatric/Behavioral: Negative for depression. The patient is not  nervous/anxious and does not have insomnia.   All other systems reviewed and are negative.    Objective:  Physical Exam Vitals signs reviewed.  Constitutional:      General: He is not in acute distress.    Appearance: He is well-developed.  HENT:     Head: Normocephalic and atraumatic.     Mouth/Throat:     Pharynx: No oropharyngeal exudate.  Eyes:     Conjunctiva/sclera: Conjunctivae normal.     Pupils: Pupils are equal, round, and reactive to light.  Neck:     Vascular: No JVD.     Trachea: No tracheal deviation.     Comments: Loss of supraclavicular fat Cardiovascular:     Rate and Rhythm: Normal rate and regular rhythm.     Heart sounds: S1 normal and S2 normal.     Comments: Distant heart tones Pulmonary:     Effort: No tachypnea or accessory muscle usage.     Breath sounds: No stridor. Decreased breath sounds (throughout all lung fields) present. No wheezing, rhonchi or rales.  Abdominal:     General: Bowel sounds are normal. There is no distension.     Palpations: Abdomen is soft.     Tenderness: There is no abdominal tenderness.  Musculoskeletal:        General: Deformity (muscle wasting ) present.  Skin:    General: Skin is warm and dry.     Capillary Refill: Capillary refill takes less than 2 seconds.     Findings: No rash.  Neurological:     Mental Status: He is alert and oriented to person, place, and time.  Psychiatric:        Behavior: Behavior normal.      Vitals:   03/17/19 0855  BP: 122/82  Pulse: (!) 59   Temp: 98 F (36.7 C)  TempSrc: Oral  SpO2: 98%  Weight: 153 lb 6.4 oz (69.6 kg)  Height: 5\' 10"  (1.778 m)   98% on RA BMI Readings from Last 3 Encounters:  03/17/19 22.01 kg/m  03/15/19 21.56 kg/m  02/03/19 22.18 kg/m   Wt Readings from Last 3 Encounters:  03/17/19 153 lb 6.4 oz (69.6 kg)  03/15/19 154 lb 9.6 oz (70.1 kg)  02/03/19 159 lb (72.1 kg)     CBC    Component Value Date/Time   WBC 6.8 06/05/2018 1022   RBC 4.35 06/05/2018 1022   HGB 15.3 06/05/2018 1022   HCT 44.8 06/05/2018 1022   PLT 251 06/05/2018 1022   MCV 103.0 (H) 06/05/2018 1022   MCH 35.2 (H) 06/05/2018 1022   MCHC 34.2 06/05/2018 1022   RDW 13.2 06/05/2018 1022   LYMPHSABS 2.3 06/05/2018 1022   MONOABS 0.7 06/05/2018 1022   EOSABS 0.2 06/05/2018 1022   BASOSABS 0.1 06/05/2018 1022    Chest Imaging: 09/21/2018: Chest x-ray: Increased hyperinflated lung fields increased retrosternal airspace.  Consistent with COPD changes. The patient's images have been independently reviewed by me.    03/16/2019: Lung cancer screening CT.  Lung RADS 2.  Evidence of emphysema. Plans for annual follow-up. The patient's images have been independently reviewed by me.    Pulmonary Functions Testing Results: PFT Results Latest Ref Rng & Units 02/03/2019  FVC-Pre L 4.57  FVC-Predicted Pre % 92  FVC-Post L 4.81  FVC-Predicted Post % 96  Pre FEV1/FVC % % 59  Post FEV1/FCV % % 70  FEV1-Pre L 2.72  FEV1-Predicted Pre % 72  FEV1-Post L 3.35  DLCO UNC% % 78  DLCO COR %Predicted % 75  TLC L 8.51  TLC % Predicted % 118  RV % Predicted % 169    FeNO: None   Pathology: None   Echocardiogram: None   Heart Catheterization: None     Assessment & Plan:     ICD-10-CM   1. Chronic obstructive pulmonary disease, unspecified COPD type (HCC)  J44.9   2. Encounter for screening for malignant neoplasm of respiratory organs  Z12.2   3. Cigarette smoker  F17.210   4. Tobacco abuse  Z72.0   5. SOB (shortness of  breath)  R06.02     Discussion:  This is a gentleman with a extensive smoking history greater than 100+ pack years.  Still at this point smoking 3 packs a day.  He is tried to quit in the past.  At this time we spent a large portion of the visit discussing various methods and smoking cessation.  We talked about the tapering method of slowly coming down on cigarette use. Due to his extensive tobacco abuse history will likely need ongoing nicotine supplementation. Encouraged use of nicotine patches or lozenges as he begins to taper. We discussed risk benefits and alternatives of starting Chantix.  He is not interested in using Chantix. Quite frankly I am not sure he is interested in quitting smoking. I do suspect that he will continue to have ongoing respiratory symptoms and ongoing continued decline in his respiratory function as he continues to smoke 3 packs a day.  This was also explained to the patient.   We will continue the patient on Trelegy inhaler at this time to help manage his COPD symptoms.  However they will likely never be under control with his amount of smoking use.  Patient to return to clinic in 6 months or as needed.  Please call with any needs for refills.  Patient was encouraged to follow-up and was given information regarding 1 800 quit now as well as other various resources for smoking cessation programs.  Greater than 50% of this patient's 15-minute office visit was been face-to-face discussing above recommendations and treatment plan.   Current Outpatient Medications:  .  albuterol (PROVENTIL) (2.5 MG/3ML) 0.083% nebulizer solution, Take 2.5 mg by nebulization every 6 (six) hours as needed.  , Disp: , Rfl:  .  amLODipine (NORVASC) 10 MG tablet, Take 10 mg by mouth daily., Disp: , Rfl: 0 .  buPROPion (WELLBUTRIN XL) 150 MG 24 hr tablet, Take 150 mg by mouth daily., Disp: , Rfl:  .  Fluticasone-Umeclidin-Vilant (TRELEGY ELLIPTA) 100-62.5-25 MCG/INH AEPB, Inhale 1  puff into the lungs daily., Disp: 2 each, Rfl: 0 .  labetalol (NORMODYNE) 200 MG tablet, Take 1 tablet (200 mg total) by mouth 2 (two) times daily., Disp: 60 tablet, Rfl: 3 .  losartan (COZAAR) 100 MG tablet, Take 1 tablet (100 mg total) by mouth daily., Disp: 30 tablet, Rfl: 3 .  omeprazole (PRILOSEC) 20 MG capsule, , Disp: , Rfl:  .  SPIRIVA HANDIHALER 18 MCG inhalation capsule, , Disp: , Rfl:  .  sucralfate (CARAFATE) 1 g tablet, , Disp: , Rfl:  .  triamterene-hydrochlorothiazide (MAXZIDE-25) 37.5-25 MG tablet, Take 1 tablet by mouth daily., Disp: 30 tablet, Rfl: 3 .  VENTOLIN HFA 108 (90 BASE) MCG/ACT inhaler, INHALE 2 PUFFS EVERY FOUR HOURS AS NEEDED FOR SHORTNESS OF BREATH, Disp: , Rfl: 3   Josephine IgoBradley L Mistey Hoffert, DO [facility] 03/17/2019 9:20 AM

## 2019-03-16 NOTE — Patient Instructions (Addendum)
Thank you for visiting Dr. Valeta Harms at [facility]. Today we recommend the following:  Call us if you need refills  You must quit smoking or vaping. This is the single most important thing that you can do to improve your lung health.   S = Set a quit date. T = Tell family, friends, and the people around you that you plan to quit. A = Anticipate or plan ahead for the tough times you'll face while quitting. R = Remove cigarettes and other tobacco products from your home, car, and work T = Talk to [place] about getting help to quit  If you need help feel free to reach out to our office, [place] [facility]ess[facility]: [phone], call 1-800-QUIT-NOW, or visit [email or web address]  Return in about 6 months (around 09/17/2019), or if symptoms worsen or fail to improve, for with APP.    Please do your part to reduce the spread of COVID-19.

## 2019-03-17 ENCOUNTER — Ambulatory Visit (INDEPENDENT_AMBULATORY_CARE_PROVIDER_SITE_OTHER): Payer: BC Managed Care – PPO | Admitting: Pulmonary Disease

## 2019-03-17 ENCOUNTER — Other Ambulatory Visit: Payer: Self-pay | Admitting: *Deleted

## 2019-03-17 ENCOUNTER — Other Ambulatory Visit: Payer: Self-pay

## 2019-03-17 ENCOUNTER — Encounter: Payer: Self-pay | Admitting: Pulmonary Disease

## 2019-03-17 VITALS — BP 122/82 | HR 59 | Temp 98.0°F | Ht 70.0 in | Wt 153.4 lb

## 2019-03-17 DIAGNOSIS — F17210 Nicotine dependence, cigarettes, uncomplicated: Secondary | ICD-10-CM | POA: Diagnosis not present

## 2019-03-17 DIAGNOSIS — Z122 Encounter for screening for malignant neoplasm of respiratory organs: Secondary | ICD-10-CM

## 2019-03-17 DIAGNOSIS — R0602 Shortness of breath: Secondary | ICD-10-CM

## 2019-03-17 DIAGNOSIS — Z720 Tobacco use: Secondary | ICD-10-CM

## 2019-03-17 DIAGNOSIS — J449 Chronic obstructive pulmonary disease, unspecified: Secondary | ICD-10-CM

## 2019-11-03 IMAGING — CT CT CHEST LUNG CANCER SCREENING LOW DOSE
2 of 6 series · 15 of 40 positions shown, 18 images · non-contrast
Comparison: Chest radiograph of 09/21/2018. A chest CT report of
11/02/2003.

CLINICAL DATA: 90 pack-year smoking history. Current smoker.

EXAM:
CT CHEST WITHOUT CONTRAST LOW-DOSE FOR LUNG CANCER SCREENING
TECHNIQUE: Multidetector CT imaging of the chest was performed following the
standard protocol without IV contrast.

[Series 4: lung 1.00 br44 cor · coronal · 0.61mm/px · 3 of 314 slices shown]
[im 63/314  lung]
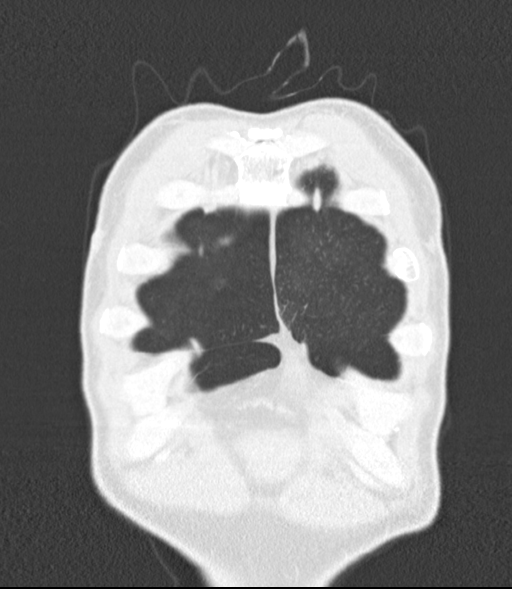
[im 126/314  lung]
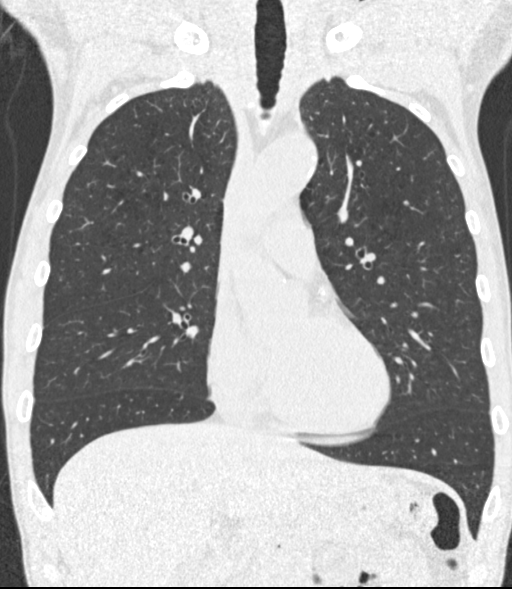
[im 188/314  lung]
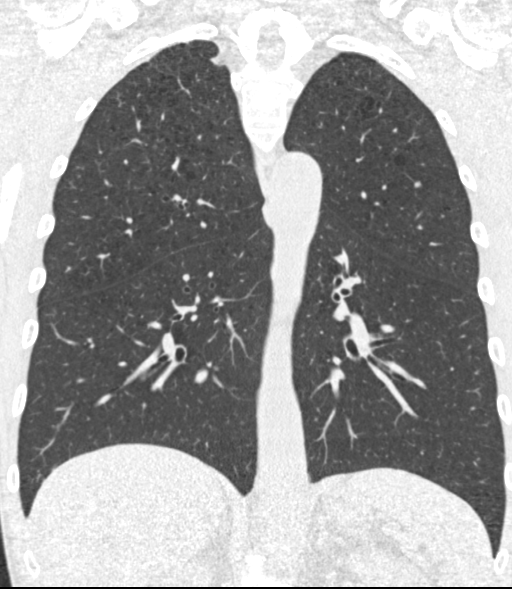

[Series 8: lung 1.00 br40 axial · axial · 0.61mm/px · z∈[-1091,-786]mm · 12 of 452 slices shown, 15 images]
[im 35/452  mediastinal]
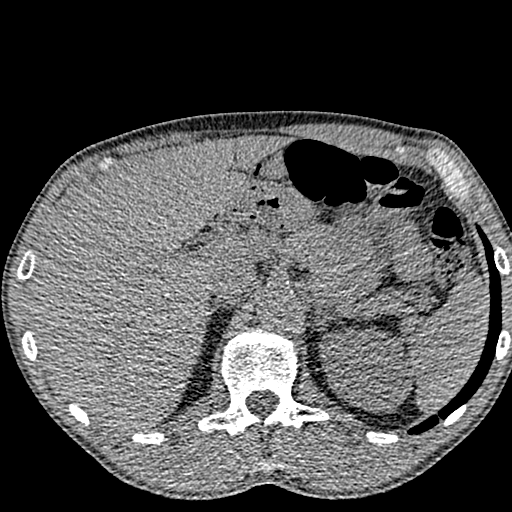
[im 35/452  lung]
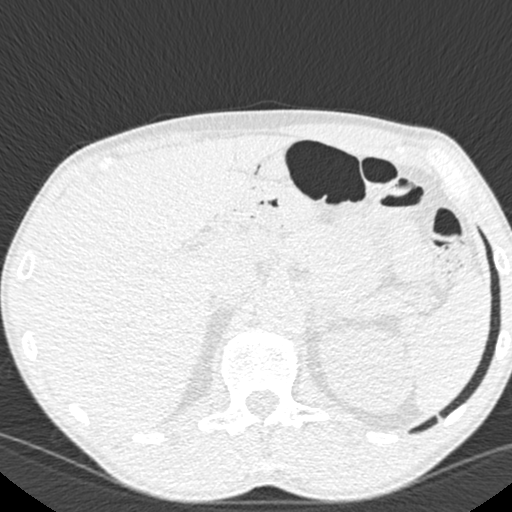
[im 70/452  lung]
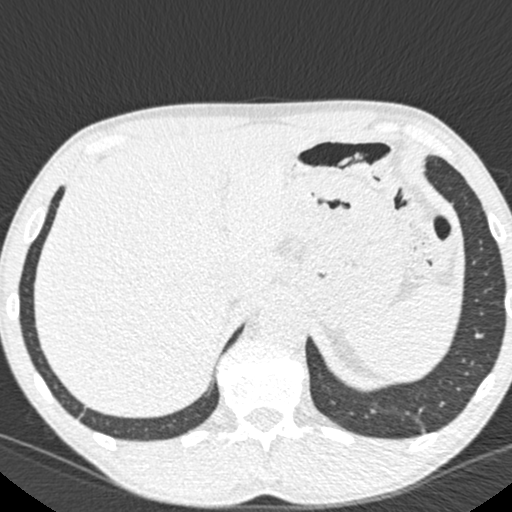
[im 105/452  lung]
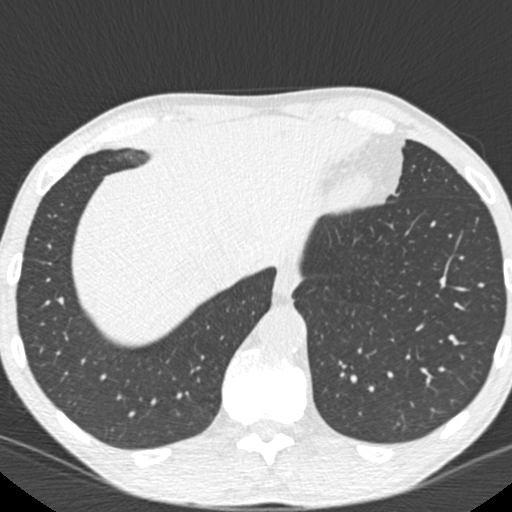
[im 139/452  lung]
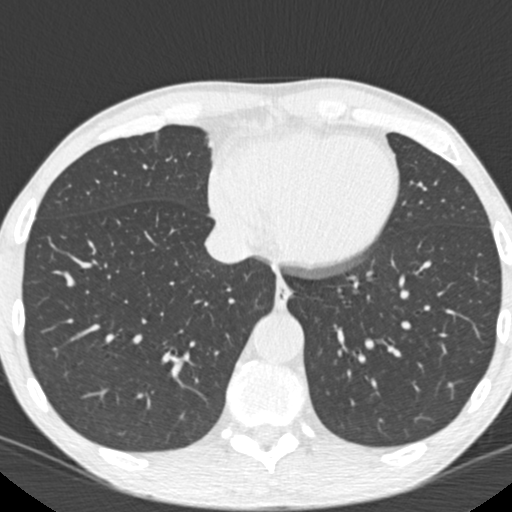
[im 174/452  mediastinal]
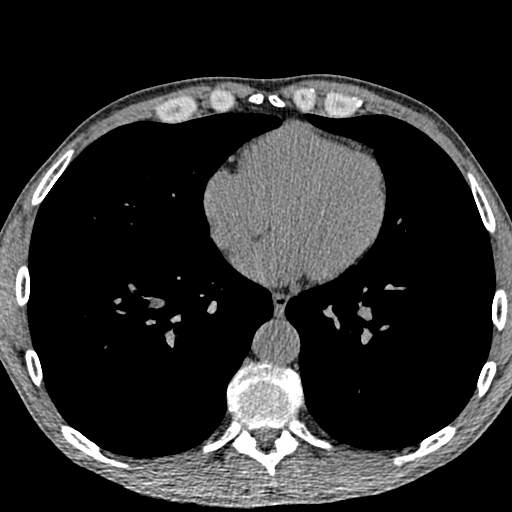
[im 174/452  lung]
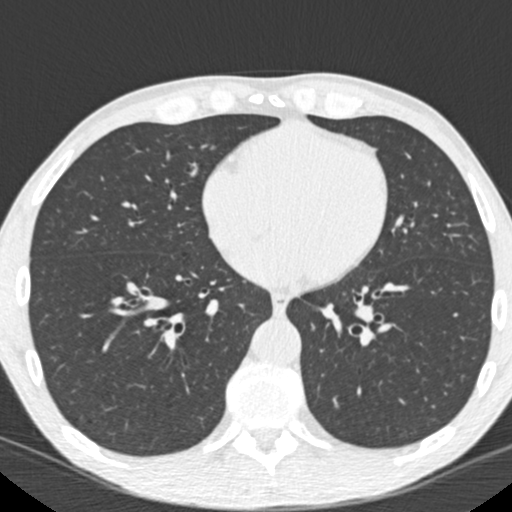
[im 209/452  lung]
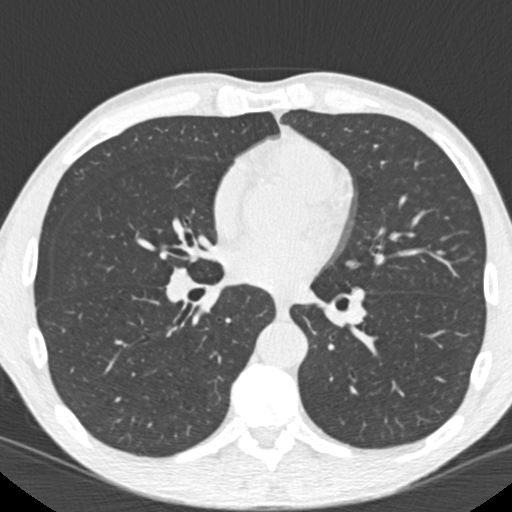
[im 243/452  lung]
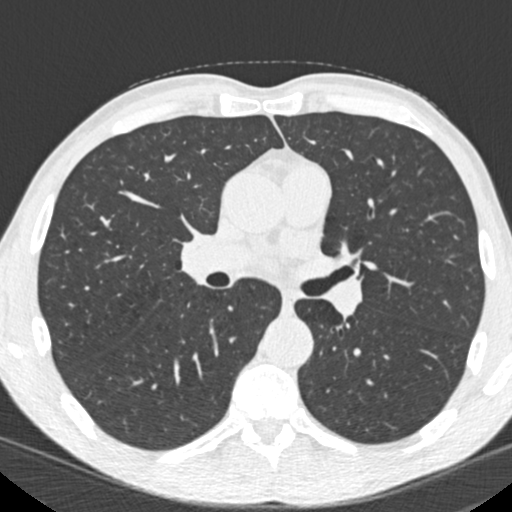
[im 278/452  lung]
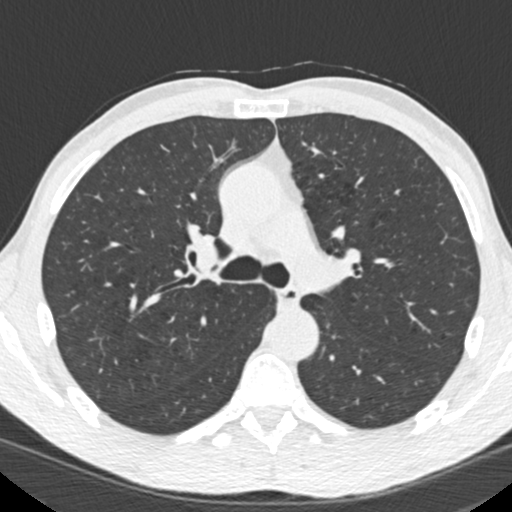
[im 313/452  mediastinal]
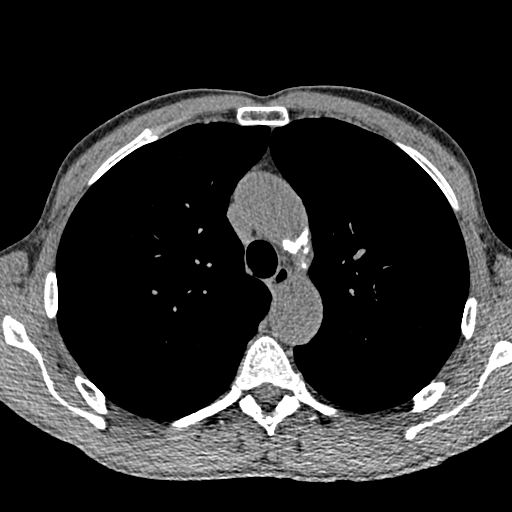
[im 313/452  lung]
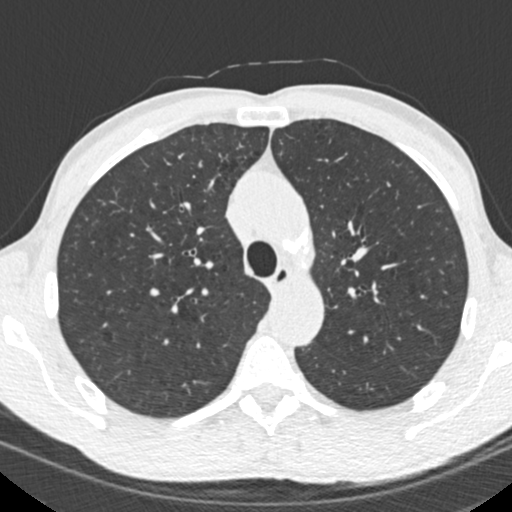
[im 347/452  lung]
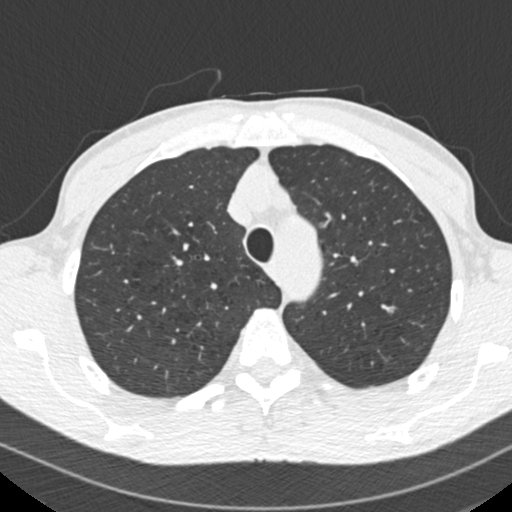
[im 382/452  lung]
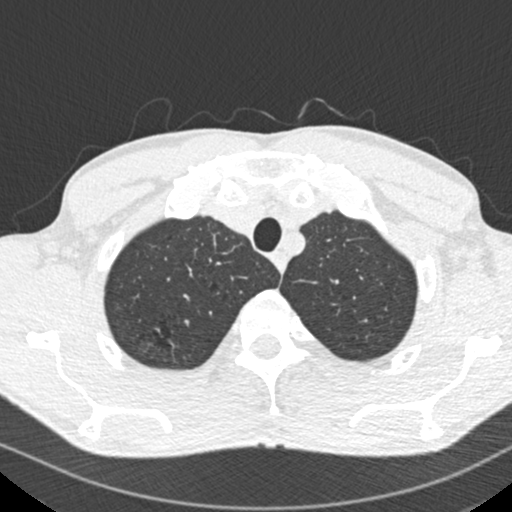
[im 417/452  lung]
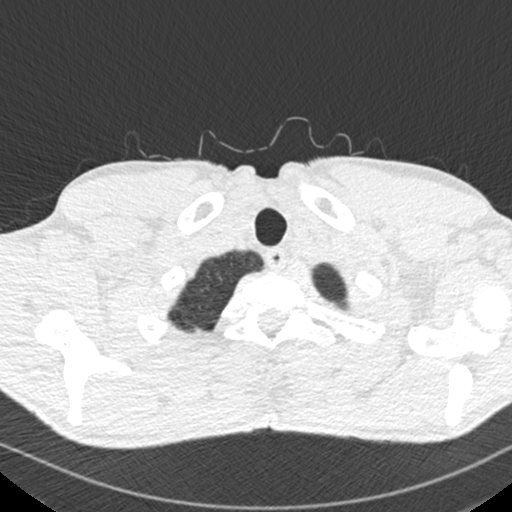

[15 of 40 positions shown; findings below may reference images not displayed]

FINDINGS: Cardiovascular: Aortic and branch vessel atherosclerosis. Normal
heart size, without pericardial effusion. Multivessel coronary
artery atherosclerosis.

Mediastinum/Nodes: No mediastinal or definite hilar adenopathy,
given limitations of unenhanced CT. Fluid level in the esophagus on
[DATE].

Lungs/Pleura: No pleural fluid. Moderate centrilobular emphysema.
Bilateral pulmonary nodules of maximally volume derived equivalent
diameter 4.6 mm.

Upper Abdomen: Tiny high left hepatic lobe cysts. Normal imaged
portions of the spleen, stomach, adrenal glands, kidneys.

Musculoskeletal: No acute osseous abnormality.
IMPRESSION: 1. Lung-RADS 2, benign appearance or behavior. Continue annual
screening with low-dose chest CT without contrast in 12 months.
2. Coronary artery atherosclerosis.
3. Esophageal air fluid level suggests dysmotility or
gastroesophageal reflux.

Aortic Atherosclerosis (ZVVK8-10J.J) and Emphysema (ZVVK8-XGA.K).

## 2023-01-07 ENCOUNTER — Emergency Department (HOSPITAL_COMMUNITY): Payer: BC Managed Care – PPO

## 2023-01-07 ENCOUNTER — Inpatient Hospital Stay (HOSPITAL_COMMUNITY)
Admission: EM | Admit: 2023-01-07 | Discharge: 2023-01-16 | DRG: 643 | Disposition: A | Discharge status: Home / Self Care | Payer: BC Managed Care – PPO | Attending: Internal Medicine | Admitting: Internal Medicine

## 2023-01-07 ENCOUNTER — Encounter (HOSPITAL_COMMUNITY): Payer: Self-pay | Admitting: Emergency Medicine

## 2023-01-07 ENCOUNTER — Other Ambulatory Visit: Payer: Self-pay

## 2023-01-07 DIAGNOSIS — Y92019 Unspecified place in single-family (private) house as the place of occurrence of the external cause: Secondary | ICD-10-CM | POA: Diagnosis not present

## 2023-01-07 DIAGNOSIS — E7211 Homocystinuria: Secondary | ICD-10-CM | POA: Diagnosis not present

## 2023-01-07 DIAGNOSIS — W19XXXA Unspecified fall, initial encounter: Secondary | ICD-10-CM | POA: Diagnosis present

## 2023-01-07 DIAGNOSIS — I82C11 Acute embolism and thrombosis of right internal jugular vein: Secondary | ICD-10-CM | POA: Diagnosis present

## 2023-01-07 DIAGNOSIS — D696 Thrombocytopenia, unspecified: Secondary | ICD-10-CM | POA: Diagnosis present

## 2023-01-07 DIAGNOSIS — D539 Nutritional anemia, unspecified: Secondary | ICD-10-CM | POA: Diagnosis present

## 2023-01-07 DIAGNOSIS — E538 Deficiency of other specified B group vitamins: Secondary | ICD-10-CM | POA: Diagnosis present

## 2023-01-07 DIAGNOSIS — E861 Hypovolemia: Secondary | ICD-10-CM | POA: Diagnosis present

## 2023-01-07 DIAGNOSIS — G9340 Encephalopathy, unspecified: Secondary | ICD-10-CM

## 2023-01-07 DIAGNOSIS — E440 Moderate protein-calorie malnutrition: Secondary | ICD-10-CM | POA: Diagnosis present

## 2023-01-07 DIAGNOSIS — Z800 Family history of malignant neoplasm of digestive organs: Secondary | ICD-10-CM

## 2023-01-07 DIAGNOSIS — R634 Abnormal weight loss: Secondary | ICD-10-CM

## 2023-01-07 DIAGNOSIS — R4182 Altered mental status, unspecified: Secondary | ICD-10-CM | POA: Diagnosis present

## 2023-01-07 DIAGNOSIS — Z720 Tobacco use: Secondary | ICD-10-CM

## 2023-01-07 DIAGNOSIS — E222 Syndrome of inappropriate secretion of antidiuretic hormone: Principal | ICD-10-CM | POA: Diagnosis present

## 2023-01-07 DIAGNOSIS — E871 Hypo-osmolality and hyponatremia: Secondary | ICD-10-CM | POA: Diagnosis not present

## 2023-01-07 DIAGNOSIS — I2694 Multiple subsegmental pulmonary emboli without acute cor pulmonale: Secondary | ICD-10-CM | POA: Diagnosis present

## 2023-01-07 DIAGNOSIS — Z86711 Personal history of pulmonary embolism: Secondary | ICD-10-CM | POA: Diagnosis not present

## 2023-01-07 DIAGNOSIS — G08 Intracranial and intraspinal phlebitis and thrombophlebitis: Secondary | ICD-10-CM | POA: Diagnosis present

## 2023-01-07 DIAGNOSIS — E785 Hyperlipidemia, unspecified: Secondary | ICD-10-CM | POA: Diagnosis present

## 2023-01-07 DIAGNOSIS — I82441 Acute embolism and thrombosis of right tibial vein: Secondary | ICD-10-CM | POA: Diagnosis present

## 2023-01-07 DIAGNOSIS — Z801 Family history of malignant neoplasm of trachea, bronchus and lung: Secondary | ICD-10-CM

## 2023-01-07 DIAGNOSIS — S0001XA Abrasion of scalp, initial encounter: Secondary | ICD-10-CM | POA: Diagnosis present

## 2023-01-07 DIAGNOSIS — J918 Pleural effusion in other conditions classified elsewhere: Secondary | ICD-10-CM | POA: Diagnosis present

## 2023-01-07 DIAGNOSIS — F10231 Alcohol dependence with withdrawal delirium: Secondary | ICD-10-CM | POA: Diagnosis present

## 2023-01-07 DIAGNOSIS — I10 Essential (primary) hypertension: Secondary | ICD-10-CM | POA: Diagnosis present

## 2023-01-07 DIAGNOSIS — Z635 Disruption of family by separation and divorce: Secondary | ICD-10-CM

## 2023-01-07 DIAGNOSIS — I82461 Acute embolism and thrombosis of right calf muscular vein: Secondary | ICD-10-CM | POA: Diagnosis present

## 2023-01-07 DIAGNOSIS — Z888 Allergy status to other drugs, medicaments and biological substances status: Secondary | ICD-10-CM

## 2023-01-07 DIAGNOSIS — E860 Dehydration: Secondary | ICD-10-CM | POA: Diagnosis present

## 2023-01-07 DIAGNOSIS — Z6820 Body mass index (BMI) 20.0-20.9, adult: Secondary | ICD-10-CM

## 2023-01-07 DIAGNOSIS — I82411 Acute embolism and thrombosis of right femoral vein: Secondary | ICD-10-CM | POA: Diagnosis present

## 2023-01-07 DIAGNOSIS — Z8051 Family history of malignant neoplasm of kidney: Secondary | ICD-10-CM

## 2023-01-07 DIAGNOSIS — Z7951 Long term (current) use of inhaled steroids: Secondary | ICD-10-CM

## 2023-01-07 DIAGNOSIS — F17210 Nicotine dependence, cigarettes, uncomplicated: Secondary | ICD-10-CM | POA: Diagnosis present

## 2023-01-07 DIAGNOSIS — Z86010 Personal history of colonic polyps: Secondary | ICD-10-CM

## 2023-01-07 DIAGNOSIS — K219 Gastro-esophageal reflux disease without esophagitis: Secondary | ICD-10-CM | POA: Diagnosis present

## 2023-01-07 DIAGNOSIS — Z8673 Personal history of transient ischemic attack (TIA), and cerebral infarction without residual deficits: Secondary | ICD-10-CM

## 2023-01-07 DIAGNOSIS — E876 Hypokalemia: Secondary | ICD-10-CM | POA: Diagnosis present

## 2023-01-07 DIAGNOSIS — Z79899 Other long term (current) drug therapy: Secondary | ICD-10-CM

## 2023-01-07 DIAGNOSIS — Z8249 Family history of ischemic heart disease and other diseases of the circulatory system: Secondary | ICD-10-CM

## 2023-01-07 DIAGNOSIS — F17200 Nicotine dependence, unspecified, uncomplicated: Secondary | ICD-10-CM | POA: Diagnosis not present

## 2023-01-07 DIAGNOSIS — I82431 Acute embolism and thrombosis of right popliteal vein: Secondary | ICD-10-CM | POA: Diagnosis present

## 2023-01-07 DIAGNOSIS — Z781 Physical restraint status: Secondary | ICD-10-CM

## 2023-01-07 DIAGNOSIS — Z803 Family history of malignant neoplasm of breast: Secondary | ICD-10-CM

## 2023-01-07 DIAGNOSIS — E8809 Other disorders of plasma-protein metabolism, not elsewhere classified: Secondary | ICD-10-CM | POA: Diagnosis not present

## 2023-01-07 DIAGNOSIS — J439 Emphysema, unspecified: Secondary | ICD-10-CM | POA: Diagnosis present

## 2023-01-07 DIAGNOSIS — Z7901 Long term (current) use of anticoagulants: Secondary | ICD-10-CM

## 2023-01-07 DIAGNOSIS — M4802 Spinal stenosis, cervical region: Secondary | ICD-10-CM | POA: Diagnosis present

## 2023-01-07 DIAGNOSIS — Z825 Family history of asthma and other chronic lower respiratory diseases: Secondary | ICD-10-CM

## 2023-01-07 DIAGNOSIS — G9341 Metabolic encephalopathy: Secondary | ICD-10-CM | POA: Diagnosis present

## 2023-01-07 DIAGNOSIS — Z86718 Personal history of other venous thrombosis and embolism: Secondary | ICD-10-CM

## 2023-01-07 DIAGNOSIS — K5900 Constipation, unspecified: Secondary | ICD-10-CM

## 2023-01-07 DIAGNOSIS — R569 Unspecified convulsions: Secondary | ICD-10-CM | POA: Diagnosis not present

## 2023-01-07 DIAGNOSIS — Z716 Tobacco abuse counseling: Secondary | ICD-10-CM

## 2023-01-07 LAB — CBC WITH DIFFERENTIAL/PLATELET
Abs Immature Granulocytes: 0.2 10*3/uL — ABNORMAL HIGH (ref 0.00–0.07)
Basophils Absolute: 0 10*3/uL (ref 0.0–0.1)
Basophils Relative: 0 %
Eosinophils Absolute: 0 10*3/uL (ref 0.0–0.5)
Eosinophils Relative: 0 %
Hemoglobin: 17.9 g/dL — ABNORMAL HIGH (ref 13.0–17.0)
Immature Granulocytes: 2 %
Lymphocytes Relative: 16 %
Lymphs Abs: 1.9 10*3/uL (ref 0.7–4.0)
Monocytes Absolute: 0.9 10*3/uL (ref 0.1–1.0)
Monocytes Relative: 7 %
Neutro Abs: 8.7 10*3/uL — ABNORMAL HIGH (ref 1.7–7.7)
Neutrophils Relative %: 75 %
Platelets: 135 10*3/uL — ABNORMAL LOW (ref 150–400)
WBC: 11.7 10*3/uL — ABNORMAL HIGH (ref 4.0–10.5)
nRBC: 0.6 % — ABNORMAL HIGH (ref 0.0–0.2)

## 2023-01-07 LAB — RAPID URINE DRUG SCREEN, HOSP PERFORMED
Amphetamines: NOT DETECTED
Barbiturates: NOT DETECTED
Benzodiazepines: NOT DETECTED
Cocaine: NOT DETECTED
Opiates: NOT DETECTED
Tetrahydrocannabinol: NOT DETECTED

## 2023-01-07 LAB — URINALYSIS, W/ REFLEX TO CULTURE (INFECTION SUSPECTED)
Bilirubin Urine: NEGATIVE
Glucose, UA: 50 mg/dL — AB
Ketones, ur: 5 mg/dL — AB
Leukocytes,Ua: NEGATIVE
Nitrite: NEGATIVE
Protein, ur: 100 mg/dL — AB
Specific Gravity, Urine: 1.015 (ref 1.005–1.030)
pH: 7 (ref 5.0–8.0)

## 2023-01-07 LAB — COMPREHENSIVE METABOLIC PANEL
ALT: 9 U/L (ref 0–44)
AST: 37 U/L (ref 15–41)
Albumin: 2.5 g/dL — ABNORMAL LOW (ref 3.5–5.0)
Alkaline Phosphatase: 91 U/L (ref 38–126)
Anion gap: 14 (ref 5–15)
BUN: 16 mg/dL (ref 8–23)
CO2: 28 mmol/L (ref 22–32)
Calcium: 7.3 mg/dL — ABNORMAL LOW (ref 8.9–10.3)
Chloride: 73 mmol/L — ABNORMAL LOW (ref 98–111)
Creatinine, Ser: 1.01 mg/dL (ref 0.61–1.24)
GFR, Estimated: >60 mL/min (ref >60)
Glucose, Bld: 95 mg/dL (ref 70–99)
Potassium: 2.7 mmol/L — CL (ref 3.5–5.1)
Sodium: 115 mmol/L — CL (ref 135–145)
Total Bilirubin: 2.5 mg/dL — ABNORMAL HIGH (ref 0.3–1.2)
Total Protein: 6.8 g/dL (ref 6.5–8.1)

## 2023-01-07 LAB — ETHANOL: Alcohol, Ethyl (B): <10 mg/dL (ref <10)

## 2023-01-07 LAB — MRSA NEXT GEN BY PCR, NASAL: MRSA by PCR Next Gen: NOT DETECTED

## 2023-01-07 LAB — CK: Total CK: 675 U/L — ABNORMAL HIGH (ref 49–397)

## 2023-01-07 LAB — AMMONIA: Ammonia: 17 umol/L (ref 9–35)

## 2023-01-07 LAB — OSMOLALITY: Osmolality: 248 mOsm/kg — CL (ref 275–295)

## 2023-01-07 LAB — TROPONIN I (HIGH SENSITIVITY): Troponin I (High Sensitivity): 14 ng/L (ref <18)

## 2023-01-07 LAB — URINE CULTURE

## 2023-01-07 LAB — OSMOLALITY, URINE: Osmolality, Ur: 357 mOsm/kg (ref 300–900)

## 2023-01-07 LAB — SODIUM, URINE, RANDOM: Sodium, Ur: 52 mmol/L

## 2023-01-07 MED ORDER — ARFORMOTEROL TARTRATE 15 MCG/2ML IN NEBU
15.0000 ug | INHALATION_SOLUTION | Freq: Two times a day (BID) | RESPIRATORY_TRACT | Status: DC
  Administered 2023-01-09 – 2023-01-15 (×13): 15 ug via RESPIRATORY_TRACT
  Filled 2023-01-07 (×14): qty 2

## 2023-01-07 MED ORDER — CHLORHEXIDINE GLUCONATE CLOTH 2 % EX PADS
6.0000 | MEDICATED_PAD | Freq: Every day | CUTANEOUS | Status: DC
  Administered 2023-01-07 – 2023-01-16 (×10): 6 via TOPICAL

## 2023-01-07 MED ORDER — MIDAZOLAM HCL 2 MG/2ML IJ SOLN
2.0000 mg | Freq: Once | INTRAMUSCULAR | Status: AC
  Administered 2023-01-07: 2 mg via INTRAVENOUS
  Filled 2023-01-07: qty 2

## 2023-01-07 MED ORDER — ORAL CARE MOUTH RINSE: 15.0000 mL | OROMUCOSAL | Status: DC | PRN

## 2023-01-07 MED ORDER — DIAZEPAM 5 MG/ML IJ SOLN
INTRAMUSCULAR | Status: AC
  Filled 2023-01-07: qty 2

## 2023-01-07 MED ORDER — IPRATROPIUM-ALBUTEROL 0.5-2.5 (3) MG/3ML IN SOLN: 3.0000 mL | Freq: Four times a day (QID) | RESPIRATORY_TRACT | Status: DC | PRN

## 2023-01-07 MED ORDER — POLYETHYLENE GLYCOL 3350 17 G PO PACK: 17.0000 g | PACK | Freq: Every day | ORAL | Status: DC | PRN

## 2023-01-07 MED ORDER — SODIUM CHLORIDE 3 % IV SOLN
INTRAVENOUS | Status: DC
  Filled 2023-01-07 (×2): qty 500

## 2023-01-07 MED ORDER — SODIUM CHLORIDE 3% (HYPERTONIC SALINE) BOLUS VIA INFUSION
100.0000 mL | Freq: Once | INTRAVENOUS | Status: AC
  Administered 2023-01-07: 100 mL via INTRAVENOUS
  Filled 2023-01-07: qty 100

## 2023-01-07 MED ORDER — ADULT MULTIVITAMIN W/MINERALS CH: 1.0000 | ORAL_TABLET | Freq: Every day | ORAL | Status: DC

## 2023-01-07 MED ORDER — LORAZEPAM 1 MG PO TABS: 1.0000 mg | ORAL_TABLET | ORAL | Status: DC | PRN

## 2023-01-07 MED ORDER — FENTANYL CITRATE PF 50 MCG/ML IJ SOSY: 50.0000 ug | PREFILLED_SYRINGE | Freq: Once | INTRAMUSCULAR | Status: DC

## 2023-01-07 MED ORDER — FOLIC ACID 1 MG PO TABS: 1.0000 mg | ORAL_TABLET | Freq: Every day | ORAL | Status: DC

## 2023-01-07 MED ORDER — DOCUSATE SODIUM 100 MG PO CAPS: 100.0000 mg | ORAL_CAPSULE | Freq: Two times a day (BID) | ORAL | Status: DC | PRN

## 2023-01-07 MED ORDER — LORAZEPAM 2 MG/ML IJ SOLN
1.0000 mg | INTRAMUSCULAR | Status: DC | PRN
  Administered 2023-01-08: 2 mg via INTRAVENOUS
  Filled 2023-01-07: qty 1

## 2023-01-07 MED ORDER — SODIUM CHLORIDE 0.9 % IV SOLN: Freq: Once | INTRAVENOUS | Status: AC

## 2023-01-07 MED ORDER — POTASSIUM CHLORIDE 10 MEQ/100ML IV SOLN
10.0000 meq | INTRAVENOUS | Status: AC
  Administered 2023-01-07 (×4): 10 meq via INTRAVENOUS
  Filled 2023-01-07 (×4): qty 100

## 2023-01-07 MED ORDER — LACTATED RINGERS IV BOLUS
1000.0000 mL | Freq: Once | INTRAVENOUS | Status: AC
  Administered 2023-01-07: 1000 mL via INTRAVENOUS

## 2023-01-07 MED ORDER — DIAZEPAM 5 MG/ML IJ SOLN
5.0000 mg | Freq: Once | INTRAMUSCULAR | Status: AC
  Administered 2023-01-07: 5 mg via INTRAVENOUS

## 2023-01-07 MED ORDER — THIAMINE HCL 100 MG/ML IJ SOLN: 100.0000 mg | Freq: Every day | INTRAMUSCULAR | Status: DC

## 2023-01-07 MED ORDER — FENTANYL CITRATE PF 50 MCG/ML IJ SOSY
PREFILLED_SYRINGE | INTRAMUSCULAR | Status: AC
  Administered 2023-01-07: 50 ug
  Filled 2023-01-07: qty 1

## 2023-01-07 MED ORDER — DEXMEDETOMIDINE HCL IN NACL 400 MCG/100ML IV SOLN
0.0000 ug/kg/h | INTRAVENOUS | Status: DC
  Administered 2023-01-07: 0.4 ug/kg/h via INTRAVENOUS
  Administered 2023-01-08: 1.2 ug/kg/h via INTRAVENOUS
  Administered 2023-01-08 (×2): 1.6 ug/kg/h via INTRAVENOUS
  Administered 2023-01-08: 1.7 ug/kg/h via INTRAVENOUS
  Administered 2023-01-08: 1.8 ug/kg/h via INTRAVENOUS
  Administered 2023-01-08: 1.2 ug/kg/h via INTRAVENOUS
  Administered 2023-01-09: 2 ug/kg/h via INTRAVENOUS
  Administered 2023-01-09: 1 ug/kg/h via INTRAVENOUS
  Administered 2023-01-09: 2 ug/kg/h via INTRAVENOUS
  Administered 2023-01-09: 1.7 ug/kg/h via INTRAVENOUS
  Administered 2023-01-09: 1.9 ug/kg/h via INTRAVENOUS
  Administered 2023-01-09: 2 ug/kg/h via INTRAVENOUS
  Filled 2023-01-07 (×15): qty 100

## 2023-01-07 MED ORDER — THIAMINE MONONITRATE 100 MG PO TABS: 100.0000 mg | ORAL_TABLET | Freq: Every day | ORAL | Status: DC

## 2023-01-07 MED ORDER — REVEFENACIN 175 MCG/3ML IN SOLN
175.0000 ug | Freq: Every day | RESPIRATORY_TRACT | Status: DC
  Administered 2023-01-09 – 2023-01-15 (×6): 175 ug via RESPIRATORY_TRACT
  Filled 2023-01-07 (×8): qty 3

## 2023-01-07 MED ORDER — POTASSIUM CHLORIDE CRYS ER 20 MEQ PO TBCR: 40.0000 meq | EXTENDED_RELEASE_TABLET | Freq: Once | ORAL | Status: DC

## 2023-01-07 MED ORDER — HEPARIN SODIUM (PORCINE) 5000 UNIT/ML IJ SOLN
5000.0000 [IU] | Freq: Three times a day (TID) | INTRAMUSCULAR | Status: DC
  Administered 2023-01-08 (×2): 5000 [IU] via SUBCUTANEOUS
  Filled 2023-01-07 (×2): qty 1

## 2023-01-07 NOTE — ED Triage Notes (Signed)
Pt was found in floor by exwife today. Pt is altered and has dried blood to the back of head. Exwife told ems she spoke with him last night and he was normal.

## 2023-01-07 NOTE — Progress Notes (Signed)
eLink Physician-Brief Progress Note Patient Name: DESTINY SCHMUCK DOB: [birth year] MRN: [id number]   Date of Service  01/07/2023  HPI/Events of Note  Patient admitted with severe hyponatremia with altered mental status of unclear etiology, work up is in progress for hyponatremia as well as for altered mental status.  eICU Interventions  New Patient Evaluation.        Thomasene Lot Shalayna Ornstein 01/07/2023, 8:08 PM

## 2023-01-07 NOTE — Progress Notes (Signed)
Arrived to [facility] via [facility]. Agitated and unable to state birthday or where he is at. Slurred, Incomprehensible speech. [facility] did not give a report on this pt . Report received from [facility], and said they had to give him 2 mg Versed on way here b/c of agitation. Contacted [facility] to notify of arrival. Awaiting orders .

## 2023-01-07 NOTE — Progress Notes (Signed)
RT attempted ABGx2 and was unable to obtain due to pt agitation

## 2023-01-07 NOTE — ED Provider Notes (Signed)
[place] EMERGENCY DEPARTMENT AT [facility] Provider Note   CSN: [id number] Arrival date & time: 01/07/23  1559     History  Chief Complaint  Patient presents with   Altered Mental Status    Isaac Rose is a 62 y.o. male.  HPI 62 year old male presents with altered mental status.  History is from the nurses spoke to EMS as EMS is no longer present.  The patient was found on the floor by ex-wife today.  She last talked him yesterday around 7 PM and he seemed to be normal.  Today he has some skin tears and abrasions to his scalp and is currently confused.  He is normally alert and oriented x 4.  The patient is currently resisting staff and try to get up out of bed and saying that he needs to go to bed.  He reportedly knew the year was 2024 but denies that he is in the hospital.  He denies any acute pain when asked but the history is very limited.  Home Medications Prior to Admission medications   Medication Sig Start Date End Date Taking? Authorizing Provider  labetalol (NORMODYNE) 300 MG tablet Take 300 mg by mouth 2 (two) times daily. 10/22/22  Yes [provider]  albuterol (PROVENTIL) (2.5 MG/3ML) 0.083% nebulizer solution Take 2.5 mg by nebulization every 6 (six) hours as needed.      [provider]  amLODipine (NORVASC) 10 MG tablet Take 10 mg by mouth daily. 05/26/18   [provider]  buPROPion (WELLBUTRIN XL) 150 MG 24 hr tablet Take 150 mg by mouth daily.    [provider]  Fluticasone-Umeclidin-Vilant (TRELEGY ELLIPTA) 100-62.5-25 MCG/INH AEPB Inhale 1 puff into the lungs daily. 02/03/19   Coral Ceo, NP  labetalol (NORMODYNE) 200 MG tablet Take 1 tablet (200 mg total) by mouth 2 (two) times daily. 03/03/18   Vassie Loll, MD  losartan (COZAAR) 100 MG tablet Take 1 tablet (100 mg total) by mouth daily. 03/03/18   Vassie Loll, MD  omeprazole (PRILOSEC) 20 MG capsule  10/15/18   [provider]  SPIRIVA  HANDIHALER 18 MCG inhalation capsule  09/08/18   [provider]  sucralfate (CARAFATE) 1 g tablet  10/15/18   [provider]  triamterene-hydrochlorothiazide (MAXZIDE-25) 37.5-25 MG tablet Take 1 tablet by mouth daily. 03/04/18   Vassie Loll, MD  VENTOLIN HFA 108 (90 BASE) MCG/ACT inhaler Inhale 2 puffs into the lungs every 4 (four) hours as needed for shortness of breath. 02/07/15   [provider]      Allergies    Effexor [venlafaxine hydrochloride]    Review of Systems   Review of Systems  Unable to perform ROS: Mental status change    Physical Exam Updated Vital Signs BP (!) 149/93 (BP Location: Right Arm)   Pulse 74   Temp 97.7 F (36.5 C) (Oral)   Resp (!) 29   Ht 5\' 10"  (1.778 m)   Wt 67.9 kg   SpO2 90%   BMI 21.48 kg/m  Physical Exam Vitals and nursing note reviewed.  Constitutional:      General: He is not in acute distress.    Appearance: He is well-developed. He is not ill-appearing or diaphoretic.  HENT:     Head: Normocephalic. Abrasion present.     Comments: Superficial abrasions (linear) to back of scalp Cardiovascular:     Rate and Rhythm: Regular rhythm. Tachycardia present.     Heart sounds: Normal  heart sounds.  Pulmonary:     Effort: Pulmonary effort is normal.     Breath sounds: Normal breath sounds.  Abdominal:     General: There is no distension.     Palpations: Abdomen is soft.     Tenderness: There is no abdominal tenderness.  Musculoskeletal:     Cervical back: No rigidity.  Skin:    General: Skin is warm and dry.     Comments: Skin tear and bruising to right upper arm  Neurological:     Mental Status: He is alert. He is disoriented.     Comments: Patient is awake and agitated. He is confused. Denies he's in the hospital. Is fighting staff and moving all 4 extremities equally     ED Results / Procedures / Treatments   Labs (all labs ordered are listed, but only abnormal results are displayed) Labs Reviewed   CK - Abnormal; Notable for the following components:      Result Value   Total CK 675 (*)    All other components within normal limits  COMPREHENSIVE METABOLIC PANEL - Abnormal; Notable for the following components:   Sodium 115 (*)    Potassium 2.7 (*)    Chloride 73 (*)    Calcium 7.3 (*)    Albumin 2.5 (*)    Total Bilirubin 2.5 (*)    All other components within normal limits  URINALYSIS, W/ REFLEX TO CULTURE (INFECTION SUSPECTED) - Abnormal; Notable for the following components:   Color, Urine AMBER (*)    APPearance CLOUDY (*)    Glucose, UA 50 (*)    Hgb urine dipstick MODERATE (*)    Ketones, ur 5 (*)    Protein, ur 100 (*)    Bacteria, UA RARE (*)    All other components within normal limits  CBC WITH DIFFERENTIAL/PLATELET - Abnormal; Notable for the following components:   WBC 11.7 (*)    Hemoglobin 17.9 (*)    Platelets 135 (*)    nRBC 0.6 (*)    Neutro Abs 8.7 (*)    Abs Immature Granulocytes 0.20 (*)    All other components within normal limits  URINE CULTURE  MRSA NEXT GEN BY PCR, NASAL  ETHANOL  RAPID URINE DRUG SCREEN, HOSP PERFORMED  AMMONIA  SODIUM, URINE, RANDOM  OSMOLALITY  OSMOLALITY, URINE  SODIUM  SODIUM  SODIUM  OSMOLALITY  OSMOLALITY, URINE  SODIUM, URINE, RANDOM  HIV ANTIBODY (ROUTINE TESTING W REFLEX)  BASIC METABOLIC PANEL  MAGNESIUM  PHOSPHORUS  CBC  CBC  MAGNESIUM  PHOSPHORUS  LACTIC ACID, PLASMA  COMPREHENSIVE METABOLIC PANEL  BLOOD GAS, ARTERIAL  CBG MONITORING, ED  I-STAT CHEM 8, ED  TROPONIN I (HIGH SENSITIVITY)    EKG EKG Interpretation  Date/Time:  Tuesday January 07 2023 16:35:00 EDT Ventricular Rate:  103 PR Interval:  139 QRS Duration: 102 QT Interval:  358 QTC Calculation: 469 R Axis:   83 Text Interpretation: Sinus tachycardia Right atrial enlargement Inferior infarct, age indeterminate Lateral leads are also involved Baseline wander in lead(s) V5 nonspecific ST segments Confirmed by Pricilla Loveless [id number])  on 01/07/2023 4:51:46 PM  Radiology CT Head Wo Contrast  Result Date: 01/07/2023 CLINICAL DATA:  Mental status change, unknown cause; Neck trauma, dangerous injury mechanism (Age 34-64y) EXAM: CT HEAD WITHOUT CONTRAST CT CERVICAL SPINE WITHOUT CONTRAST TECHNIQUE: Multidetector CT imaging of the head and cervical spine was performed following the standard protocol without intravenous contrast. Multiplanar CT image reconstructions of the cervical spine were also  generated. RADIATION DOSE REDUCTION: This exam was performed according to the departmental dose-optimization program which includes automated exposure control, adjustment of the mA and/or kV according to patient size and/or use of iterative reconstruction technique. COMPARISON:  CT head January 05, 2015. FINDINGS: CT HEAD FINDINGS Brain: No evidence of acute large vascular territory infarct, mass lesion, midline shift or definite acute hemorrhage. Vascular: Relatively dense appearance of the dural venous sinuses, particularly of the right transverse/sigmoid sinus. Skull: No acute fracture. Sinuses/Orbits: Clear sinuses.  No acute orbital findings. CT CERVICAL SPINE FINDINGS Alignment: No evidence of substantial sagittal subluxation. Skull base and vertebrae: Vertebral body heights are maintained. Soft tissues and spinal canal: No prevertebral fluid or swelling. No visible canal hematoma. Disc levels: Multilevel degenerative disease including disc bulges and endplate spurring. Multilevel facet and uncovertebral hypertrophy with varying degrees of neural foraminal stenosis, greatest and likely severe on the right at C5-C6. Upper chest: Emphysema.  Visualized lung apices are clear. IMPRESSION: 1. Relatively dense appearance of the dural venous sinuses, particularly of the right transverse/sigmoid sinus. This could be artifactual, but recommend MRI head with contrast to exclude dural venous sinus thrombosis. 2. No evidence of acute fracture or traumatic malalignment  in the cervical spine. 3. 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 MRI of the cervical spine could further evaluate if clinically warranted. Electronically Signed   By: Feliberto Harts M.D.   On: 01/07/2023 17:10   CT Cervical Spine Wo Contrast  Result Date: 01/07/2023 CLINICAL DATA:  Mental status change, unknown cause; Neck trauma, dangerous injury mechanism (Age 80-64y) EXAM: CT HEAD WITHOUT CONTRAST CT CERVICAL SPINE WITHOUT CONTRAST TECHNIQUE: Multidetector CT imaging of the head and cervical spine was performed following the standard protocol without intravenous contrast. Multiplanar CT image reconstructions of the cervical spine were also generated. RADIATION DOSE REDUCTION: This exam was performed according to the departmental dose-optimization program which includes automated exposure control, adjustment of the mA and/or kV according to patient size and/or use of iterative reconstruction technique. COMPARISON:  CT head January 05, 2015. FINDINGS: CT HEAD FINDINGS Brain: No evidence of acute large vascular territory infarct, mass lesion, midline shift or definite acute hemorrhage. Vascular: Relatively dense appearance of the dural venous sinuses, particularly of the right transverse/sigmoid sinus. Skull: No acute fracture. Sinuses/Orbits: Clear sinuses.  No acute orbital findings. CT CERVICAL SPINE FINDINGS Alignment: No evidence of substantial sagittal subluxation. Skull base and vertebrae: Vertebral body heights are maintained. Soft tissues and spinal canal: No prevertebral fluid or swelling. No visible canal hematoma. Disc levels: Multilevel degenerative disease including disc bulges and endplate spurring. Multilevel facet and uncovertebral hypertrophy with varying degrees of neural foraminal stenosis, greatest and likely severe on the right at C5-C6. Upper chest: Emphysema.  Visualized lung apices are clear. IMPRESSION: 1. Relatively dense appearance of the dural venous sinuses, particularly of  the right transverse/sigmoid sinus. This could be artifactual, but recommend MRI head with contrast to exclude dural venous sinus thrombosis. 2. No evidence of acute fracture or traumatic malalignment in the cervical spine. 3. 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 MRI of the cervical spine could further evaluate if clinically warranted. Electronically Signed   By: Feliberto Harts M.D.   On: 01/07/2023 17:10   DG Chest Portable 1 View  Result Date: 01/07/2023 CLINICAL DATA:  Altered mental status EXAM: PORTABLE CHEST 1 VIEW COMPARISON:  X-ray 09/21/2018 and older FINDINGS: The left inferior costophrenic angle is clipped off the edge of the film. No consolidation, pneumothorax or effusion.  No edema. Normal cardiopericardial silhouette. Calcified aorta. Overlapping cardiac leads. IMPRESSION: No acute cardiopulmonary disease Electronically Signed   By: Karen Kays M.D.   On: 01/07/2023 17:07    Procedures .Critical Care  Performed by: Pricilla Loveless, MD Authorized by: Pricilla Loveless, MD   Critical care provider statement:    Critical care time (minutes):  40   Critical care time was exclusive of:  Separately billable procedures and treating other patients   Critical care was necessary to treat or prevent imminent or life-threatening deterioration of the following conditions:  CNS failure or compromise and metabolic crisis   Critical care was time spent personally by me on the following activities:  Development of treatment plan with patient or surrogate, discussions with consultants, evaluation of patient's response to treatment, examination of patient, ordering and review of laboratory studies, ordering and review of radiographic studies, ordering and performing treatments and interventions, pulse oximetry, re-evaluation of patient's condition and review of old charts     Medications Ordered in ED Medications  diazepam (VALIUM) 5 MG/ML injection (  Not Given 01/07/23 1634)  Oral care  mouth rinse (has no administration in time range)  Chlorhexidine Gluconate Cloth 2 % PADS 6 each (6 each Topical Given 01/07/23 2012)  potassium chloride SA (KLOR-CON M) CR tablet 40 mEq (0 mEq Oral Hold 01/07/23 2117)  potassium chloride 10 mEq in 100 mL IVPB (10 mEq Intravenous New Bag/Given 01/07/23 2136)  sodium chloride (hypertonic) 3 % solution ( Intravenous New Bag/Given 01/07/23 2125)  docusate sodium (COLACE) capsule 100 mg (has no administration in time range)  polyethylene glycol (MIRALAX / GLYCOLAX) packet 17 g (has no administration in time range)  heparin injection 5,000 Units (has no administration in time range)  ipratropium-albuterol (DUONEB) 0.5-2.5 (3) MG/3ML nebulizer solution 3 mL (has no administration in time range)  arformoterol (BROVANA) nebulizer solution 15 mcg (has no administration in time range)  revefenacin (YUPELRI) nebulizer solution 175 mcg (has no administration in time range)  dexmedetomidine (PRECEDEX) 400 MCG/100ML (4 mcg/mL) infusion (0.4 mcg/kg/hr  67.9 kg Intravenous New Bag/Given 01/07/23 2135)  LORazepam (ATIVAN) tablet 1-4 mg (has no administration in time range)    Or  LORazepam (ATIVAN) injection 1-4 mg (has no administration in time range)  thiamine (VITAMIN B1) tablet 100 mg (has no administration in time range)    Or  thiamine (VITAMIN B1) injection 100 mg (has no administration in time range)  folic acid (FOLVITE) tablet 1 mg (has no administration in time range)  multivitamin with minerals tablet 1 tablet (has no administration in time range)  fentaNYL (SUBLIMAZE) injection 50 mcg ( Intravenous Canceled Entry 01/07/23 2204)  diazepam (VALIUM) injection 5 mg (5 mg Intravenous Given 01/07/23 1634)  lactated ringers bolus 1,000 mL (0 mLs Intravenous Stopped 01/07/23 1853)  0.9 %  sodium chloride infusion ( Intravenous New Bag/Given 01/07/23 1853)  sodium chloride 3% bolus via infusion 100 mL (100 mLs Intravenous Bolus from Bag 01/07/23 2110)  midazolam (VERSED)  injection 2 mg (2 mg Intravenous Given 01/07/23 2102)  fentaNYL (SUBLIMAZE) 50 MCG/ML injection (50 mcg  Given 01/07/23 2204)    ED Course/ Medical Decision Making/ A&P Clinical Course as of 01/07/23 2219  Tue Jan 07, 2023  1751 I discussed with critical care, Dr. Celine Mans.  She recommends giving normal saline at 100/hr, no 3% for now based on presentation. Recommends Q4 BMP, urine sodium, osm blood and urine.  He is alert but altered.  He is not fluid overloaded on  my assessment and likely dehydrated but not hypotensive. [SG]  1826 Sister reports that he had vomiting over the weekend and he does not but she is not sure how much.  Could be a combination of things but he is probably dehydrated causing his hyponatremia, +/- hyponatremia from alcohol.  No seizures. [SG]    Clinical Course User Index [SG] Pricilla Loveless, MD                             Medical Decision Making Amount and/or Complexity of Data Reviewed Labs: ordered.    Details: Severe hyponatremia 115.  UA without UTI. Radiology: ordered and independent interpretation performed.    Details: CT head without head bleed.  No pneumonia on x-ray. ECG/medicine tests: ordered and independent interpretation performed.    Details: Sinus tachycardia  Risk OTC drugs. Prescription drug management. Decision regarding hospitalization.   Presents with significant altered mental status.  He is awake, alert, and protecting his airway but is definitely confused.  He is tachycardic on arrival though he is also very agitated.  He was given IV Valium.  He was started on some IV fluids and when his labs return to became apparent that he is severely hyponatremic.  He does not appear fluid overloaded.  As above, will hold off on 3% per ICU recommendations and start normal saline at 100/h per the recommendations.  CT head is negative.  CT C-spine unremarkable.  Has some other minor trauma but no indication for other x-rays.  Will transfer to [facility] to  the ICU for admission for symptomatic hyponatremia.        Final Clinical Impression(s) / ED Diagnoses Final diagnoses:  Hyponatremia    Rx / DC Orders ED Discharge Orders     None         Pricilla Loveless, MD 01/07/23 2307

## 2023-01-07 NOTE — H&P (Signed)
NAME:  Isaac Rose, MRN:  [id number], DOB:  11-Nov-1960, LOS: 0 ADMISSION DATE:  01/07/2023, CONSULTATION DATE:  01/07/2023 REFERRING MD: Criss Alvine - EDP CHIEF COMPLAINT:  AMS   History of Present Illness:  62 year old man who presented to [facility] ED 6/4 after being found on the floor by his ex-wife, altered with abrasions to his scalp. LKW 6/3PM. PMHx significant for HTN, COPD with history of heavy tobacco abuse, GERD, constipation, HA and depression. Of note, patient was last seen in pulmonary clinic 03/2019 for COPD (followed by Dr. Tonia Brooms), with ongoing 3-PPD tobacco abuse.  On ED arrival, patient was noted to be afebrile, HR 90s, hypertensive (BP 170/102) with SpO2 WNL. Remained confused and agitated, requiring Valium to keep in bed. Labs were notable for WBC 11.7, Hgb 17.9, Plt 135. Na 115, K 2.7, Cl 73, BUN/Cr WNL. TBili 2.5. CK 675, trop 14, EtOH and UDS negative, UA without concern for UTI but +moderate Hgb/ RBCs. CXR negative for acute process. 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 CT Head showed dense appearance of the dural venous sinuses, particularly the R transverse/sigmoid sinus (possible artifactual), MRI Brain recommended to exlude dural venous sinus thrombosis.   Transferred to [facility] for higher level of care and hypertonic saline initiation. PCCM consulted for ICU admission/transfer.  Pertinent Medical History:  Tobacco abuse- heavy, COPD, GERD, HTN, depression, constipation, headaches  Significant Hospital Events: Including procedures, antibiotic start and stop dates in addition to other pertinent events   6/4 - Presented to [facility] for AMS. Found down by ex-wife at home, altered and with scalp abrasions. CT Head with dense appearance of the dural venous sinuses, particularly the R transverse/sigmoid sinus (possible artifactual); CT C-spine negative. Na 115, K 2.7.  Interim History / Subjective:  PCCM consulted for ICU  admission.  Objective:  Blood pressure (!) 149/93, pulse 74, temperature 97.7 F (36.5 C), temperature source Oral, resp. rate (!) 29, height 5\' 10"  (1.778 m), weight 67.9 kg, SpO2 93 %.       No intake or output data in the 24 hours ending 01/07/23 2044 Filed Weights   01/07/23 1607 01/07/23 2003  Weight: 70 kg 67.9 kg   Physical Examination: General: Chronically ill-appearing middle-aged man in NAD. Confused, irritable, somewhat somnolent. HEENT: [place]/AT, anicteric sclera, PERRL 4mm, dry mucous membranes. Neuro: Lethargic. Intermittently irritable/agitated. Responds to verbal stimuli. Not following commands. Moves all 4 extremities spontaneously. CV: RRR, no m/g/r. PULM: Breathing even and unlabored on RA. Lung fields with faint expiratory wheeze, no rhonchi/rales. GI: Soft, nontender, nondistended. Normoactive bowel sounds. Extremities: No LE edema noted. Skin: Warm/dry, no rashes. Abrasions to posterior head, skin tear to R dorsal aspect of hand.  Resolved Hospital Problem List:    Assessment & Plan:   Severe hyponatremia with AMS, hypovolemic to euvolemic on exam HCTZ noted on MAR.  Also given heavy smoking hx and previous bilateral pulmonary nodules on lung cancer screening CT, consider CT Chest imaging.  - Admit to ICU for close monitoring - HTS 3% initiation with bolus, then 95mL/hr - Na checks Q2H then Q4H; goal Na correction no more than 10-12 mEq/24H period - F/u serum/urine Osm, urine Na - Consider TSH, cortisol - Seizure precautions - Neuroprotective measures: HOB > 30 degrees, normoglycemia, normothermia, electrolytes WNL  Acute metabolic encephalopathy, secondary to hyponatremia R/o dural venous sinus thrombosis  CT Head showed dense appearance of the dural venous sinuses, particularly the R transverse/sigmoid sinus (possible artifactual). - Hyponatremia  workup as above - Obtain MRI Brain when stable/able to tolerate - Correct metabolic/electrolyte  derangements - Trend WBC, fever curve; follow Cx  Hypokalemia  - Trend BMP, Mg - Replete electrolytes as indicated - Monitor I&Os - F/u urine studies  COPD with ongoing tobacco use - Supplemental O2 support as needed - Wean O2 for sat > 90% - Bronchodilators as ordered (Brovana/Yupelri, DuoNebs PRN) - Pulmonary hygiene - Encourage tobacco cessation  HTN Home medications: amlodipine 10mg , labetalol 300mg  BID on fill history. - Resume home medications as indicated,   Thrombocytopenia - Trend CBC  GERD - PPI  Best Practice (right click and "Reselect all SmartList Selections" daily)   Diet/type: NPO DVT prophylaxis: DOAC GI prophylaxis: PPI Lines: N/A Foley:  N/A Code Status:  full code Last date of multidisciplinary goals of care discussion [Pending]  Labs   CBC: Recent Labs  Lab 01/07/23 1621  WBC 11.7*  NEUTROABS 8.7*  HGB 17.9*  HCT RESULTS UNAVAILABLE DUE TO INTERFERING SUBSTANCE  MCV RESULTS UNAVAILABLE DUE TO INTERFERING SUBSTANCE  PLT 135*   Basic Metabolic Panel: Recent Labs  Lab 01/07/23 1621  NA 115*  K 2.7*  CL 73*  CO2 28  GLUCOSE 95  BUN 16  CREATININE 1.01  CALCIUM 7.3*   GFR: Estimated Creatinine Clearance: 72.8 mL/min (by C-G formula based on SCr of 1.01 mg/dL). Recent Labs  Lab 01/07/23 1621  WBC 11.7*   Liver Function Tests: Recent Labs  Lab 01/07/23 1621  AST 37  ALT 9  ALKPHOS 91  BILITOT 2.5*  PROT 6.8  ALBUMIN 2.5*   No results for input(s): "LIPASE", "AMYLASE" in the last 168 hours. Recent Labs  Lab 01/07/23 1758  AMMONIA 17   ABG:    Component Value Date/Time   PHART 7.458 (H) 10/09/2010 0900   PCO2ART 35.2 10/09/2010 0900   PO2ART 103.0 (H) 10/09/2010 0900   HCO3 24.6 (H) 10/09/2010 0900   TCO2 21.2 10/09/2010 0900   ACIDBASEDEF 1.6 10/09/2010 0900   O2SAT 98.9 10/09/2010 0900    Coagulation Profile: No results for input(s): "INR", "PROTIME" in the last 168 hours.  Cardiac Enzymes: Recent Labs   Lab 01/07/23 1621  CKTOTAL 675*   HbA1C: No results found for: "HGBA1C"  CBG: No results for input(s): "GLUCAP" in the last 168 hours.  Review of Systems:   Patient is encephalopathic; therefore, history has been obtained from chart review.   Past Medical History:  He,  has a past medical history of Chronic constipation, COPD (chronic obstructive pulmonary disease) (HCC), Depression, GERD (gastroesophageal reflux disease), Headache, and Hypertension.   Surgical History:   Past Surgical History:  Procedure Laterality Date   APPENDECTOMY     COLONOSCOPY  08   NUR   head trauma     from trauma   UPPER GASTROINTESTINAL ENDOSCOPY      Social History:   reports that he has been smoking cigarettes. He started smoking about 52 years ago. He has a 144.00 pack-year smoking history. He quit smokeless tobacco use about 35 years ago.  His smokeless tobacco use included chew. He reports current alcohol use. He reports that he does not use drugs.   Family History:  His family history includes Aneurysm in his sister; Breast cancer in his sister; Colon cancer in his maternal aunt and maternal uncle; Emphysema in his father; Heart disease in his brother; Kidney cancer in his sister; Lung cancer in his mother.   Allergies: Allergies  Allergen Reactions   Effexor [  Venlafaxine Hydrochloride] Other (See Comments)    unknown    Home Medications: Prior to Admission medications   Medication Sig Start Date End Date Taking? Authorizing Provider  albuterol (PROVENTIL) (2.5 MG/3ML) 0.083% nebulizer solution Take 2.5 mg by nebulization every 6 (six) hours as needed.      [provider]  amLODipine (NORVASC) 10 MG tablet Take 10 mg by mouth daily. 05/26/18   [provider]  buPROPion (WELLBUTRIN XL) 150 MG 24 hr tablet Take 150 mg by mouth daily.    [provider]  Fluticasone-Umeclidin-Vilant (TRELEGY ELLIPTA) 100-62.5-25 MCG/INH AEPB Inhale 1 puff into the lungs daily.  02/03/19   Coral Ceo, NP  labetalol (NORMODYNE) 200 MG tablet Take 1 tablet (200 mg total) by mouth 2 (two) times daily. 03/03/18   Vassie Loll, MD  losartan (COZAAR) 100 MG tablet Take 1 tablet (100 mg total) by mouth daily. 03/03/18   Vassie Loll, MD  omeprazole (PRILOSEC) 20 MG capsule  10/15/18   [provider]  SPIRIVA HANDIHALER 18 MCG inhalation capsule  09/08/18   [provider]  sucralfate (CARAFATE) 1 g tablet  10/15/18   [provider]  triamterene-hydrochlorothiazide (MAXZIDE-25) 37.5-25 MG tablet Take 1 tablet by mouth daily. 03/04/18   Vassie Loll, MD  VENTOLIN HFA 108 (90 BASE) MCG/ACT inhaler INHALE 2 PUFFS EVERY FOUR HOURS AS NEEDED FOR SHORTNESS OF BREATH 02/07/15   [provider]    Critical care time:    The patient is critically ill with multiple organ system failure and requires high complexity decision making for assessment and support, frequent evaluation and titration of therapies, advanced monitoring, review of radiographic studies and interpretation of complex data.   Critical Care Time devoted to patient care services, exclusive of separately billable procedures, described in this note is 39 minutes.  Tim Lair, PA-C [facility] 01/07/23 8:44 PM  Please see [email or web address] for pager details.  From 7A-7P if no response, please call [phone] After hours, please call ELink [phone]

## 2023-01-07 NOTE — ED Notes (Signed)
Pt's exwife states she spoke with him last night around 7pm and he was normal.

## 2023-01-07 NOTE — Progress Notes (Signed)
Agitated and will not follow commands. IV team nurse at bedside to place a 2nd IV . 3 Rns trying to hold his arms still so she could get an IV but agitated and keeps moving arms and bending arms and stating, "Im going to bed." Repetitively. He is disoriented to place and month/year, and does not answer any questions appropriately.

## 2023-01-08 ENCOUNTER — Inpatient Hospital Stay (HOSPITAL_COMMUNITY): Payer: BC Managed Care – PPO

## 2023-01-08 DIAGNOSIS — E871 Hypo-osmolality and hyponatremia: Secondary | ICD-10-CM | POA: Diagnosis not present

## 2023-01-08 DIAGNOSIS — E440 Moderate protein-calorie malnutrition: Secondary | ICD-10-CM | POA: Insufficient documentation

## 2023-01-08 LAB — BASIC METABOLIC PANEL
Anion gap: 14 (ref 5–15)
Anion gap: 13 (ref 5–15)
Anion gap: 12 (ref 5–15)
BUN: 15 mg/dL (ref 8–23)
BUN: 14 mg/dL (ref 8–23)
BUN: 18 mg/dL (ref 8–23)
CO2: 24 mmol/L (ref 22–32)
CO2: 24 mmol/L (ref 22–32)
CO2: 24 mmol/L (ref 22–32)
Calcium: 7 mg/dL — ABNORMAL LOW (ref 8.9–10.3)
Calcium: 7.1 mg/dL — ABNORMAL LOW (ref 8.9–10.3)
Calcium: 7.6 mg/dL — ABNORMAL LOW (ref 8.9–10.3)
Chloride: 86 mmol/L — ABNORMAL LOW (ref 98–111)
Chloride: 89 mmol/L — ABNORMAL LOW (ref 98–111)
Chloride: 91 mmol/L — ABNORMAL LOW (ref 98–111)
Creatinine, Ser: 0.75 mg/dL (ref 0.61–1.24)
Creatinine, Ser: 0.75 mg/dL (ref 0.61–1.24)
Creatinine, Ser: 0.59 mg/dL — ABNORMAL LOW (ref 0.61–1.24)
GFR, Estimated: >60 mL/min (ref >60)
GFR, Estimated: >60 mL/min (ref >60)
GFR, Estimated: >60 mL/min (ref >60)
Glucose, Bld: 82 mg/dL (ref 70–99)
Glucose, Bld: 95 mg/dL (ref 70–99)
Glucose, Bld: 91 mg/dL (ref 70–99)
Potassium: 2.5 mmol/L — CL (ref 3.5–5.1)
Potassium: 2.6 mmol/L — CL (ref 3.5–5.1)
Potassium: 2.3 mmol/L — CL (ref 3.5–5.1)
Sodium: 124 mmol/L — ABNORMAL LOW (ref 135–145)
Sodium: 126 mmol/L — ABNORMAL LOW (ref 135–145)
Sodium: 127 mmol/L — ABNORMAL LOW (ref 135–145)

## 2023-01-08 LAB — POCT I-STAT 7, (LYTES, BLD GAS, ICA,H+H)
Acid-Base Excess: 4 mmol/L — ABNORMAL HIGH (ref 0.0–2.0)
Bicarbonate: 26.3 mmol/L (ref 20.0–28.0)
Calcium, Ion: 0.96 mmol/L — ABNORMAL LOW (ref 1.15–1.40)
HCT: 46 % (ref 39.0–52.0)
Hemoglobin: 15.6 g/dL (ref 13.0–17.0)
O2 Saturation: 96 %
Patient temperature: 98.6
Potassium: 2.2 mmol/L — CL (ref 3.5–5.1)
Sodium: 126 mmol/L — ABNORMAL LOW (ref 135–145)
TCO2: 27 mmol/L (ref 22–32)
pCO2 arterial: 31.7 mmHg — ABNORMAL LOW (ref 32–48)
pH, Arterial: 7.527 — ABNORMAL HIGH (ref 7.35–7.45)
pO2, Arterial: 70 mmHg — ABNORMAL LOW (ref 83–108)

## 2023-01-08 LAB — CBC
HCT: 42.1 % (ref 39.0–52.0)
HCT: 42.4 % (ref 39.0–52.0)
Hemoglobin: 16.1 g/dL (ref 13.0–17.0)
Hemoglobin: 15.9 g/dL (ref 13.0–17.0)
MCH: 41 pg — ABNORMAL HIGH (ref 26.0–34.0)
MCH: 39.9 pg — ABNORMAL HIGH (ref 26.0–34.0)
MCHC: 38.2 g/dL — ABNORMAL HIGH (ref 30.0–36.0)
MCHC: 37.5 g/dL — ABNORMAL HIGH (ref 30.0–36.0)
MCV: 107.1 fL — ABNORMAL HIGH (ref 80.0–100.0)
MCV: 106.5 fL — ABNORMAL HIGH (ref 80.0–100.0)
Platelets: 121 10*3/uL — ABNORMAL LOW (ref 150–400)
Platelets: 115 10*3/uL — ABNORMAL LOW (ref 150–400)
RBC: 3.93 MIL/uL — ABNORMAL LOW (ref 4.22–5.81)
RBC: 3.98 MIL/uL — ABNORMAL LOW (ref 4.22–5.81)
RDW: 14.3 % (ref 11.5–15.5)
RDW: 14.3 % (ref 11.5–15.5)
WBC: 8 10*3/uL (ref 4.0–10.5)
WBC: 7.8 10*3/uL (ref 4.0–10.5)
nRBC: 0 % (ref 0.0–0.2)
nRBC: 0 % (ref 0.0–0.2)

## 2023-01-08 LAB — COMPREHENSIVE METABOLIC PANEL
ALT: 11 U/L (ref 0–44)
AST: 44 U/L — ABNORMAL HIGH (ref 15–41)
Albumin: 2.1 g/dL — ABNORMAL LOW (ref 3.5–5.0)
Alkaline Phosphatase: 72 U/L (ref 38–126)
Anion gap: 14 (ref 5–15)
BUN: 14 mg/dL (ref 8–23)
CO2: 23 mmol/L (ref 22–32)
Calcium: 7.1 mg/dL — ABNORMAL LOW (ref 8.9–10.3)
Chloride: 88 mmol/L — ABNORMAL LOW (ref 98–111)
Creatinine, Ser: 0.75 mg/dL (ref 0.61–1.24)
GFR, Estimated: >60 mL/min (ref >60)
Glucose, Bld: 93 mg/dL (ref 70–99)
Potassium: 2.6 mmol/L — CL (ref 3.5–5.1)
Sodium: 125 mmol/L — ABNORMAL LOW (ref 135–145)
Total Bilirubin: 2.2 mg/dL — ABNORMAL HIGH (ref 0.3–1.2)
Total Protein: 5.7 g/dL — ABNORMAL LOW (ref 6.5–8.1)

## 2023-01-08 LAB — SODIUM
Sodium: 122 mmol/L — ABNORMAL LOW (ref 135–145)
Sodium: 129 mmol/L — ABNORMAL LOW (ref 135–145)

## 2023-01-08 LAB — OSMOLALITY: Osmolality: 256 mOsm/kg — ABNORMAL LOW (ref 275–295)

## 2023-01-08 LAB — TROPONIN I (HIGH SENSITIVITY): Troponin I (High Sensitivity): 21 ng/L — ABNORMAL HIGH (ref <18)

## 2023-01-08 LAB — GLUCOSE, CAPILLARY
Glucose-Capillary: 94 mg/dL (ref 70–99)
Glucose-Capillary: 91 mg/dL (ref 70–99)
Glucose-Capillary: 102 mg/dL — ABNORMAL HIGH (ref 70–99)

## 2023-01-08 LAB — MAGNESIUM
Magnesium: 1 mg/dL — ABNORMAL LOW (ref 1.7–2.4)
Magnesium: 1.1 mg/dL — ABNORMAL LOW (ref 1.7–2.4)
Magnesium: 2.1 mg/dL (ref 1.7–2.4)

## 2023-01-08 LAB — URINE CULTURE: Culture: NO GROWTH

## 2023-01-08 LAB — PHOSPHORUS
Phosphorus: 3.1 mg/dL (ref 2.5–4.6)
Phosphorus: 3.2 mg/dL (ref 2.5–4.6)
Phosphorus: 3.2 mg/dL (ref 2.5–4.6)

## 2023-01-08 LAB — CORTISOL: Cortisol, Plasma: 16.8 ug/dL

## 2023-01-08 LAB — OSMOLALITY, URINE: Osmolality, Ur: 162 mOsm/kg — ABNORMAL LOW (ref 300–900)

## 2023-01-08 LAB — TSH: TSH: 2.92 u[IU]/mL (ref 0.350–4.500)

## 2023-01-08 LAB — LACTIC ACID, PLASMA: Lactic Acid, Venous: 1 mmol/L (ref 0.5–1.9)

## 2023-01-08 LAB — SODIUM, URINE, RANDOM: Sodium, Ur: 12 mmol/L

## 2023-01-08 MED ORDER — VITAL HIGH PROTEIN PO LIQD: 1000.0000 mL | ORAL | Status: DC

## 2023-01-08 MED ORDER — PROSOURCE TF20 ENFIT COMPATIBL EN LIQD
60.0000 mL | Freq: Every day | ENTERAL | Status: DC
  Administered 2023-01-08 – 2023-01-10 (×3): 60 mL
  Filled 2023-01-08 (×3): qty 60

## 2023-01-08 MED ORDER — GUAIFENESIN ER 600 MG PO TB12
600.0000 mg | ORAL_TABLET | Freq: Two times a day (BID) | ORAL | Status: AC | PRN
  Administered 2023-01-09: 600 mg via ORAL
  Filled 2023-01-08: qty 1

## 2023-01-08 MED ORDER — POTASSIUM CHLORIDE 10 MEQ/50ML IV SOLN
10.0000 meq | INTRAVENOUS | Status: AC
  Administered 2023-01-08 – 2023-01-09 (×4): 10 meq via INTRAVENOUS
  Filled 2023-01-08 (×4): qty 50

## 2023-01-08 MED ORDER — LORAZEPAM 2 MG/ML IJ SOLN
1.0000 mg | INTRAMUSCULAR | Status: DC | PRN
  Administered 2023-01-08 – 2023-01-10 (×5): 2 mg via INTRAVENOUS
  Filled 2023-01-08 (×5): qty 1

## 2023-01-08 MED ORDER — SODIUM CHLORIDE 0.9 % IV SOLN: INTRAVENOUS | Status: DC | PRN

## 2023-01-08 MED ORDER — CALCIUM GLUCONATE-NACL 2-0.675 GM/100ML-% IV SOLN
2.0000 g | Freq: Once | INTRAVENOUS | Status: AC
  Administered 2023-01-08: 2000 mg via INTRAVENOUS
  Filled 2023-01-08: qty 100

## 2023-01-08 MED ORDER — OSMOLITE 1.5 CAL PO LIQD
1000.0000 mL | ORAL | Status: DC
  Administered 2023-01-08 – 2023-01-10 (×2): 1000 mL

## 2023-01-08 MED ORDER — POTASSIUM CHLORIDE 20 MEQ PO PACK
40.0000 meq | PACK | Freq: Once | ORAL | Status: AC
  Administered 2023-01-08: 40 meq
  Filled 2023-01-08: qty 2

## 2023-01-08 MED ORDER — SODIUM CHLORIDE 0.9 % IV SOLN: INTRAVENOUS | Status: DC

## 2023-01-08 MED ORDER — BUDESONIDE 0.25 MG/2ML IN SUSP
0.2500 mg | Freq: Two times a day (BID) | RESPIRATORY_TRACT | Status: DC
  Administered 2023-01-09 – 2023-01-15 (×13): 0.25 mg via RESPIRATORY_TRACT
  Filled 2023-01-08 (×14): qty 2

## 2023-01-08 MED ORDER — DOCUSATE SODIUM 50 MG/5ML PO LIQD: 100.0000 mg | Freq: Two times a day (BID) | ORAL | Status: DC | PRN

## 2023-01-08 MED ORDER — THIAMINE HCL 100 MG/ML IJ SOLN: 100.0000 mg | Freq: Every day | INTRAMUSCULAR | Status: DC

## 2023-01-08 MED ORDER — AMLODIPINE BESYLATE 10 MG PO TABS
10.0000 mg | ORAL_TABLET | Freq: Every day | ORAL | Status: DC
  Administered 2023-01-08 – 2023-01-10 (×3): 10 mg
  Filled 2023-01-08 (×3): qty 1

## 2023-01-08 MED ORDER — ADULT MULTIVITAMIN W/MINERALS CH
1.0000 | ORAL_TABLET | Freq: Every day | ORAL | Status: DC
  Administered 2023-01-08 – 2023-01-10 (×3): 1
  Filled 2023-01-08 (×3): qty 1

## 2023-01-08 MED ORDER — HEPARIN (PORCINE) 25000 UT/250ML-% IV SOLN
1400.0000 [IU]/h | INTRAVENOUS | Status: DC
  Administered 2023-01-08: 1150 [IU]/h via INTRAVENOUS
  Administered 2023-01-09: 1400 [IU]/h via INTRAVENOUS
  Filled 2023-01-08 (×2): qty 250

## 2023-01-08 MED ORDER — HEPARIN BOLUS VIA INFUSION
4800.0000 [IU] | Freq: Once | INTRAVENOUS | Status: AC
  Administered 2023-01-08: 4800 [IU] via INTRAVENOUS
  Filled 2023-01-08: qty 4800

## 2023-01-08 MED ORDER — MIDAZOLAM HCL 2 MG/2ML IJ SOLN
4.0000 mg | Freq: Once | INTRAMUSCULAR | Status: AC
  Administered 2023-01-08: 4 mg via INTRAVENOUS
  Filled 2023-01-08: qty 4

## 2023-01-08 MED ORDER — POLYETHYLENE GLYCOL 3350 17 G PO PACK: 17.0000 g | PACK | Freq: Every day | ORAL | Status: DC | PRN

## 2023-01-08 MED ORDER — MAGNESIUM SULFATE 2 GM/50ML IV SOLN
2.0000 g | Freq: Once | INTRAVENOUS | Status: AC
  Administered 2023-01-08: 2 g via INTRAVENOUS
  Filled 2023-01-08: qty 50

## 2023-01-08 MED ORDER — ALBUTEROL SULFATE (2.5 MG/3ML) 0.083% IN NEBU: 2.5000 mg | INHALATION_SOLUTION | RESPIRATORY_TRACT | Status: DC | PRN

## 2023-01-08 MED ORDER — GADOBUTROL 1 MMOL/ML IV SOLN
7.0000 mL | Freq: Once | INTRAVENOUS | Status: AC | PRN
  Administered 2023-01-08: 7 mL via INTRAVENOUS

## 2023-01-08 MED ORDER — FOLIC ACID 1 MG PO TABS
1.0000 mg | ORAL_TABLET | Freq: Every day | ORAL | Status: DC
  Administered 2023-01-08 – 2023-01-10 (×3): 1 mg
  Filled 2023-01-08 (×3): qty 1

## 2023-01-08 MED ORDER — POTASSIUM CHLORIDE 10 MEQ/100ML IV SOLN
10.0000 meq | INTRAVENOUS | Status: AC
  Administered 2023-01-08 (×8): 10 meq via INTRAVENOUS
  Filled 2023-01-08 (×8): qty 100

## 2023-01-08 MED ORDER — THIAMINE MONONITRATE 100 MG PO TABS
100.0000 mg | ORAL_TABLET | Freq: Every day | ORAL | Status: DC
  Administered 2023-01-08 – 2023-01-10 (×3): 100 mg
  Filled 2023-01-08 (×3): qty 1

## 2023-01-08 MED ORDER — MAGNESIUM SULFATE 4 GM/100ML IV SOLN
4.0000 g | Freq: Once | INTRAVENOUS | Status: AC
  Administered 2023-01-08: 4 g via INTRAVENOUS
  Filled 2023-01-08: qty 100

## 2023-01-08 NOTE — Progress Notes (Signed)
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 Lesser, nonocclusive thrombus within the left transverse and sigmoid venous sinuses. Some superficial venous thrombosis over the parietal convexities as well.  Will start heparin infusion.  Coralyn Helling, MD [facility] Pulmonary/Critical Care Pager - [phone] or [phone] 01/08/2023, 7:17 PM

## 2023-01-08 NOTE — Procedures (Signed)
Cortrak  Person Inserting Tube:  Isaac Rose, RD Tube Type:  Cortrak - 43 inches Tube Size:  10 Tube Location:  Left nare Secured by: Bridle Technique Used to Measure Tube Placement:  Marking at nare/corner of mouth Cortrak Secured At:  71 cm Procedure Comments:  Cortrak Tube Team Note:  Consult received to place a Cortrak feeding tube.   X-ray is required, abdominal x-ray has been ordered by the Cortrak team. Please confirm tube placement before using the Cortrak tube.   If the tube becomes dislodged please keep the tube and contact the Cortrak team at [email or web address] for replacement.  If after hours and replacement cannot be delayed, place a NG tube and confirm placement with an abdominal x-ray.    Isaac Rose, RD, LDN Clinical Dietitian RD pager # available in AMION  After hours/weekend pager # available in AMION    

## 2023-01-08 NOTE — Progress Notes (Addendum)
Notified Stephanie, PAC of Na 124 and K 2.5 with labs this morning.   VO to stop 3% Na at this time . Will recheck Na per orders Q 4 hrs.

## 2023-01-08 NOTE — Progress Notes (Signed)
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 Lesser, nonocclusive thrombus within the left transverse and sigmoid venous sinuses. Some superficial venous thrombosis over the parietal convexities as wel  Allergies  Allergen Reactions   Effexor [Venlafaxine Hydrochloride] Other (See Comments)    unknown    Patient Measurements: Height: 5\' 10"  (177.8 cm) Weight: 68.3 kg (150 lb 9.2 oz) IBW/kg (Calculated) : 73 Heparin Dosing Weight: 67.9 kg  Vital Signs: Temp: 98.6 F (37 C) (06/05 1933) Temp Source: Axillary (06/05 1933) BP: 129/91 (06/05 1930) Pulse Rate: 70 (06/05 1930)  Labs: Recent Labs    01/07/23 1621 01/08/23 0131 01/08/23 0228 01/08/23 0732 01/08/23 1813  HGB 17.9* 16.1 15.6 15.9  --   HCT RESULTS UNAVAILABLE DUE TO INTERFERING SUBSTANCE 42.1 46.0 42.4  --   PLT 135* 121*  --  115*  --   CREATININE 1.01 0.75  --  0.75 0.59*  CKTOTAL 675*  --   --   --   --   TROPONINIHS 14  --   --  21*  --     Estimated Creatinine Clearance: 92.5 mL/min (A) (by C-G formula based on SCr of 0.59 mg/dL (L)).   Medical History: Past Medical History:  Diagnosis Date   Chronic constipation    COPD (chronic obstructive pulmonary disease) (HCC)    Depression    GERD (gastroesophageal reflux disease)    Headache    Hypertension     Medications:  Medications Prior to Admission  Medication Sig Dispense Refill Last Dose   labetalol (NORMODYNE) 300 MG tablet Take 300 mg by mouth 2 (two) times daily.      albuterol (PROVENTIL) (2.5 MG/3ML) 0.083% nebulizer solution Take 2.5 mg by nebulization every 6 (six) hours as needed.        amLODipine (NORVASC) 10 MG tablet Take 10 mg by mouth daily.  0    buPROPion (WELLBUTRIN XL) 150 MG 24 hr tablet Take 150 mg by mouth daily.       Fluticasone-Umeclidin-Vilant (TRELEGY ELLIPTA) 100-62.5-25 MCG/INH AEPB Inhale 1 puff into the lungs daily. 2 each 0    labetalol (NORMODYNE) 200 MG tablet Take 1 tablet (200 mg total) by mouth 2 (two) times daily. 60 tablet 3    losartan (COZAAR) 100 MG tablet Take 1 tablet (100 mg total) by mouth daily. 30 tablet 3    omeprazole (PRILOSEC) 20 MG capsule       SPIRIVA HANDIHALER 18 MCG inhalation capsule       sucralfate (CARAFATE) 1 g tablet       triamterene-hydrochlorothiazide (MAXZIDE-25) 37.5-25 MG tablet Take 1 tablet by mouth daily. 30 tablet 3    VENTOLIN HFA 108 (90 BASE) MCG/ACT inhaler Inhale 2 puffs into the lungs every 4 (four) hours as needed for shortness of breath.  3     Assessment: 27 YOM admitted after being found on the floor by ex-wife. Patient has an extensive VTE requiring AC. The patient is not on any known AC therapy PTA  Goal of Therapy:  Heparin level 0.3-0.7 units/ml Monitor platelets by anticoagulation protocol: Yes   Plan:  Give 4800 units bolus x 1 Start heparin infusion at 1150 units/hr Check anti-Xa level in 8 hours and daily while on heparin Continue to monitor H&H and platelets  Yacine Droz BS, PharmD, BCPS Clinical Pharmacist 01/08/2023 7:38 PM  Contact: [phone]  after 3 PM  "Be curious, not judgmental..." -Debbora Dus

## 2023-01-08 NOTE — Progress Notes (Signed)
Notified Dominga Ferry., RN of K 2.3 just resulted. See new orders.

## 2023-01-08 NOTE — TOC CAGE-AID Note (Signed)
Transition of Care [facility]) - CAGE-AID Screening   Patient Details  Name: Isaac Rose MRN: [id number] Date of Birth: 07-03-1961  [facility] CM/SW Contact:    Ralene Bathe, LCSW Phone Number: 01/08/2023, 12:24 PM   Clinical Narrative: LCSW received consult for SA.  Per RN, patient is currently restrained, disoriented, and not following commands and therefore unable to participate in CAGE-AID.    LCSW added substance use resources to AVS.   TOC following.     CAGE-AID Screening: Substance Abuse Screening unable to be completed due to: : Patient unable to participate

## 2023-01-08 NOTE — Progress Notes (Signed)
RT unsuccessful with placing arterial line at this time. Dr. Craige Cotta made aware. RT will continue to monitor and be available as needed.

## 2023-01-08 NOTE — Progress Notes (Signed)
Initial Nutrition Assessment  DOCUMENTATION CODES:   Non-severe (moderate) malnutrition in context of social or environmental circumstances  INTERVENTION:   Initiate tube feeds via Cortrak: - Start Osmolite 1.5 @ 15 ml/hr and advance rate by 10 ml q 8 hours to goal rate of 55 ml/hr (1320 ml/day) - PROSource TF20 60 ml daily  Tube feeding regimen at goal rate provides 2060 kcal, 103 grams of protein, and 1006 ml of H2O.   Monitor magnesium, potassium, and phosphorus q 12 hours for at least 6 occurrences, MD to replete as needed, as pt is at risk for refeeding syndrome given malnutrition, EtOH abuse.  - Continue MVI with minerals, folic acid, thiamine  NUTRITION DIAGNOSIS:   Moderate Malnutrition related to social / environmental circumstances (EtOH abuse) as evidenced by moderate fat depletion, severe muscle depletion.  GOAL:   Patient will meet greater than or equal to 90% of their needs  MONITOR:   Diet advancement, Labs, Weight trends, TF tolerance, I & O's  REASON FOR ASSESSMENT:   Consult Enteral/tube feeding initiation and management  ASSESSMENT:   62 year old male who presented to the ED on 6/04 with AMS. PMH of HTN, COPD, heavy tobacco abuse, GERD, constipation, HA, depression. Pt admitted with severe hyponatremia, acute metabolic encephalopathy.  06/05 - Cortrak placed (tip distal stomach)  Discussed pt with RN and during ICU rounds. Consult received for enteral nutrition initiation and management. Cortrak placed this morning with tip in distal stomach per abdominal x-ray. Per RN, plan to start tube feeds after pt returns from MRI.  Spoke with pt's sister Corrie Dandy at bedside. She states that pt has not been able to keep anything down for the last 1 week due to vomiting. She was told this by pt's other sister and pt's ex-wife. She states that pt was vomiting everything he tried to eat or drink and was experiencing dry heaves as well. Pt had this same thing happen to him  a few weeks ago as well.  RD asked about PO intake when pt is feeling well. Pt's sister states that pt does not eat much at baseline. She reports that sometimes pt will only eat 1 pack of Nabs through an entire day. Pt drinks a lot of [employer] and drinks liquor. Pt's sister is unsure how much liquor pt consumes.  Pt's sister states that she has noticed that pt has lost weight. She states that pt has also reported weight loss to her and needing to buy new pants. Reviewed weight history in chart. Last available weight PTA is from 2020. Current weight is down ~1 kg since 03/17/19. Based on NFPE, pt meets criteria for moderate malnutrition.  Admit weight: 67.9 kg Current weight: 68.3 kg  Medications reviewed and include: folic acid, MVI with minerals, thiamine, precedex gtt, magnesium sulfate 4 grams x 1 followed by 2 grams x 1, IV KCl 10 mEq x 8 IVF: NS @ 75 ml/hr  Labs reviewed: sodium 125, potassium 2.6, chloride 88, ionized calcium 0.96, magnesium 1.1 CBG's: 94  UOP: 600 ml x 12 hours I/O's: +40 ml since admit  NUTRITION - FOCUSED PHYSICAL EXAM:  Flowsheet Row Most Recent Value  Orbital Region Moderate depletion  Upper Arm Region Mild depletion  Thoracic and Lumbar Region Moderate depletion  Buccal Region Moderate depletion  Temple Region Mild depletion  Clavicle Bone Region Severe depletion  Clavicle and Acromion Bone Region Severe depletion  Scapular Bone Region Moderate depletion  Dorsal Hand Unable to assess  [mitts, restrained]  Patellar  Region Moderate depletion  Anterior Thigh Region Severe depletion  Posterior Calf Region Moderate depletion  Edema (RD Assessment) None  Hair Reviewed  Eyes Unable to assess  Mouth Reviewed  Skin Reviewed  Nails Reviewed       Diet Order:   Diet Order             Diet NPO time specified  Diet effective now                   EDUCATION NEEDS:   Not appropriate for education at this time  Skin:  Skin Assessment:  Reviewed RN Assessment  Last BM:  no documented BM  Height:   Ht Readings from Last 1 Encounters:  01/07/23 5\' 10"  (1.778 m)    Weight:   Wt Readings from Last 1 Encounters:  01/08/23 68.3 kg    Ideal Body Weight:  75.5 kg  BMI:  Body mass index is 21.61 kg/m.  Estimated Nutritional Needs:   Kcal:  2000-2200  Protein:  90-110 grams  Fluid:  >2.0 L    Mertie Clause, MS, RD, LDN Inpatient Clinical Dietitian Please see AMiON for contact information.

## 2023-01-08 NOTE — Procedures (Signed)
Central Venous Catheter Insertion Procedure Note  Isaac Rose  [id number]  05-07-1961  Date:01/08/23  Time:5:42 PM   Provider Performing:Shalaya Swailes   Procedure: Insertion of Non-tunneled Central Venous [phone]) with US guidance ([id number])   Indication(s) Medication administration and Difficult access  Consent Risks of the procedure as well as the alternatives and risks of each were explained to the patient and/or caregiver.  Consent for the procedure was obtained and is signed in the bedside chart  Anesthesia Topical only with 1% lidocaine   Timeout Verified patient identification, verified procedure, site/side was marked, verified correct patient position, special equipment/implants available, medications/allergies/relevant history reviewed, required imaging and test results available.  Sterile Technique Maximal sterile technique including full sterile barrier drape, hand hygiene, sterile gown, sterile gloves, mask, hair covering, sterile ultrasound probe cover (if used).  Procedure Description Area of catheter insertion was cleaned with chlorhexidine and draped in sterile fashion.  With real-time ultrasound guidance a central venous catheter was placed into the left subclavian vein. Nonpulsatile blood flow and easy flushing noted in all ports.  The catheter was sutured in place and sterile dressing applied.  Complications/Tolerance None; patient tolerated the procedure well. Chest X-ray is ordered to verify placement for internal jugular or subclavian cannulation.   Chest x-ray is not ordered for femoral cannulation.  EBL Minimal  Specimen(s) None  Coralyn Helling, MD [facility] Pulmonary/Critical Care Pager - [phone] or [phone] 01/08/2023, 5:43 PM

## 2023-01-08 NOTE — Progress Notes (Signed)
Keeps trying to get up out of bed and says he's leaving this place. Remains disoriented * 4. He says he's at home. It is too early to give his PRN ativan. Precedex gtt increased per order.

## 2023-01-08 NOTE — Progress Notes (Addendum)
NAME:  Isaac Rose, MRN:  [id number], DOB:  1961-03-10, LOS: 1 ADMISSION DATE:  01/07/2023, CONSULTATION DATE:  01/07/2023 REFERRING MD: Criss Alvine - EDP CHIEF COMPLAINT:  AMS   History of Present Illness:  62 yo male smoker presented to [facility] ER after being found on the floor at home by his ex-wife.  Na 115, and CT head showed dense appearance of the dural venous sinuses, particularly the R transverse/sigmoid sinus (possible artifactual).  Transferred to [facility] for further management.  Pertinent Medical History:  Tobacco abuse- heavy, COPD, GERD, HTN, depression, constipation, headaches  Significant Hospital Events: Including procedures, antibiotic start and stop dates in addition to other pertinent events   6/4 - Presented to [facility] for AMS. Found down by ex-wife at home, altered and with scalp abrasions. CT Head with dense appearance of the dural venous sinuses, particularly the R transverse/sigmoid sinus (possible artifactual); CT C-spine negative. Na 115, K 2.7.  Interim History / Subjective:  Remains on precedex.  Objective:  Blood pressure (!) 138/95, pulse 61, temperature 98 F (36.7 C), temperature source Oral, resp. rate 20, height 5\' 10"  (1.778 m), weight 68.3 kg, SpO2 93 %.        Intake/Output Summary (Last 24 hours) at 01/08/2023 4098 Last data filed at 01/08/2023 0800 Gross per 24 hour  Intake 1558.99 ml  Output 1050 ml  Net 508.99 ml   Filed Weights   01/07/23 1607 01/07/23 2003 01/08/23 0710  Weight: 70 kg 67.9 kg 68.3 kg   Physical Examination:  General - sedated Eyes - pupils reactive ENT - no sinus tenderness, no stridor Cardiac - regular rate/rhythm, no murmur Chest - equal breath sounds b/l, no wheezing or rales Abdomen - soft, non tender, + bowel sounds Extremities - no cyanosis, clubbing, or edema Skin - no rashes Neuro - RASS -2   Resolved Hospital Problem List:    Assessment & Plan:   Acute metabolic encephalopathy from hypovolemic hyponatremia in  setting of presumed ETOH. Acute alcohol withdrawal with delirium. - change to NS at 75 ml/hr - f/u Na q4h - continue precedex for RASS goal 0 to -1 - prn IV ativan for CIWA > 8 - continue thiamine, folic acid, MVI  Dense appearance on CT head of the dural venous sinuses, particularly the R transverse/sigmoid sinus (possible artifactual). 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 - MRI brain, cervical spine  Hypokalemia. Hypomagnesemia. - f/u electrolytes and replace as needed  COPD with tobacco abuse. - brovana, yupelri, pulmicort - prn albuterol - tobacco cessation  Hx of HTN. - continue norvasc - hold outpt labetalol, cozaar, triamterene, HCTZ - goal BP normotensive  Hx of GERD. - protonix  Best Practice (right click and "Reselect all SmartList Selections" daily)   Diet/type: tubefeeds DVT prophylaxis: prophylactic heparin  GI prophylaxis: PPI Lines: N/A Foley:  N/A Code Status:  full code Last date of multidisciplinary goals of care discussion [Pending]  Labs       Latest Ref Rng & Units 01/08/2023    7:32 AM 01/08/2023    2:28 AM 01/08/2023    1:31 AM  CMP  Glucose 70 - 99 mg/dL 93   82   BUN 8 - 23 mg/dL 14   15   Creatinine 1.19 - 1.24 mg/dL 1.47   8.29   Sodium 562 - 145 mmol/L 125  126  124   Potassium 3.5 - 5.1 mmol/L 2.6  2.2  2.5   Chloride 98 - 111 mmol/L 88   86  CO2 22 - 32 mmol/L 23   24   Calcium 8.9 - 10.3 mg/dL 7.1   7.0   Total Protein 6.5 - 8.1 g/dL 5.7     Total Bilirubin 0.3 - 1.2 mg/dL 2.2     Alkaline Phos 38 - 126 U/L 72     AST 15 - 41 U/L 44     ALT 0 - 44 U/L 11          Latest Ref Rng & Units 01/08/2023    7:32 AM 01/08/2023    2:28 AM 01/08/2023    1:31 AM  CBC  WBC 4.0 - 10.5 K/uL 7.8   8.0   Hemoglobin 13.0 - 17.0 g/dL 16.1  09.6  04.5   Hematocrit 39.0 - 52.0 % 42.4  46.0  42.1   Platelets 150 - 400 K/uL 115   121     ABG    Component Value Date/Time   PHART 7.527 (H) 01/08/2023 0228   PCO2ART 31.7 (L) 01/08/2023 0228    PO2ART 70 (L) 01/08/2023 0228   HCO3 26.3 01/08/2023 0228   TCO2 27 01/08/2023 0228   ACIDBASEDEF 1.6 10/09/2010 0900   O2SAT 96 01/08/2023 0228    CBG (last 3)  Recent Labs    01/07/23 2000  GLUCAP 94     Critical care time: 42 minutes  Coralyn Helling, MD [place] [facility] Pager - [phone] or [phone] 01/08/2023, 9:19 AM

## 2023-01-08 NOTE — Progress Notes (Signed)
Woke up trying to get up out of bed and saying, "I dont know you, who are you ? I need to get out of here. You are all crazy."    All attempts to re-orient are unsuccessful. CIWA-13. IV ativan given per order.

## 2023-01-08 NOTE — Progress Notes (Signed)
There was consult for 2nd PIV access, but patient has working PIV access and patent's RN wanted to have 2nd PIV access for just in case. Explained patient doesn't require 2nd PIV access since patient is agitated. Informed patient's RN to put in the consult if patient need it. HS [employer]

## 2023-01-08 NOTE — Discharge Instructions (Addendum)
Substance Use Resources    Outpatient Providers   [facility] ([facility]) Group and individual counseling. [address]  [place], [place] [address] [phone] [place]: [phone]  [place]: [phone] Medicaid and uninsured.   [facility] Offers IOP groups multiple times per week. [address] [place], [place] [address] [phone] Takes Medicaid and other insurances.   [facility]  Chemical Dependency Intensive Outpatient Program (IOP) [address] [place], [place] [address] [phone] Takes Nurse, learning disability and [place].   [facility]  IOP and Partial Hospitalization Program  [address]  [place], [place] [address] [phone] Private Insurance, [place] only for partial hospitalization     [facility] ([facility]) IOP, individual counseling, medication management [address] [place], [place] [address] [phone] Medicaid and [facility]  [facility] [address]  [place], [place] [address] [phone] Private Insurance and Self Pay   [facility] Outpatient [address]  [place], [place] [address] [phone] Private Insurance, [place], and Self Pay   [facility]  [address] [place], [place] [address] [facility]  [address]  [place], [place] [address] [phone]  [facility]  [address] [place], [place] [address] [phone]      Residential Treatment Programs  [facility] [address] [place], [place] [address] [phone] or [phone] Detox and Residential Rehab 14 days (Medicare, Medicaid, private insurance, and self pay)  [facility] [address]  [place], [place] [address] [phone] Detox (self Pay and Medicaid Limited availability) Rehab Only  Male (Medicare, [place], and Self Pay)  [facility] [address] [place], [place] [address] [phone] or [phone] Private Insurance only  [facility]  [address]  [place], [place] [address] [phone] Treatment Only, must make assessment appointment, and must be sober for assessment appointment. Self pay, Medicare A and B, [facility], must be [facility] resident.      Residential Treatment Programs  [facility] [address]  [place] , [place]  [phone] [employer], [place], [place]. They offer assistance with transportation.   [facility] [address] [place],  [place], [place] [address] [phone] [facility] No insurance     [facility]  [address] [place], [place] [address] [phone] No pending legal charges, Long-term work program  [facility]: [facility]  [address]  [place], [place] [address] [address] [place], [place] [address] Residential treatment (takes people on Methadone/Suboxone)  Medicaid and uninsured  [facility]  [address] [place], [place] [address] [phone] or [phone] [employer] Insurance Only [facility] - [phone] Private Insurance Males/Females, call to make referrals, multiple facilities   [facility] [address]  [place], [place] [address]  [phone] Men Only Upfront [facility] [address]      [facility]: [facility] [address] [place], [place] [address] [phone]  [facility] [address] [place], [place] [address] [phone] (f)  [facility] [address] [place], [place] [address] [phone] (f)       Syringe Services Program Due to COVID-19, syringe services programs are likely operating  under different hours with limited or no fixed site hours. Some programs may not be operating at all. Please contact the program directly using the phone numbers provided below to see if they are still operating under COVID-19.  [facility]ioid Problem ([facility]) Fixed; mobile; peer-based Roxy Cedar [phone] [email or web address] Fixed site exchange at [facility], [address] [place], [place] [address] on Wednesdays (2:00 - 5:00 pm) and Thursdays (4:00 - 8:00 pm). Pop-up mobile exchange locations: [employer] and [employer], [address] [place], [place] [address] on Tuesdays (11:00 am - 1:00 pm) and Fridays (11:00 am - 1:00 pm) [facility] - [address], [place], [place] [address] on Tuesdays (2:00 - 4:00 pm) and Fridays (2:00 - 4:00 pm) [facility] - also serves [place] and [employer] [place] [employer] Fixed; mobile; peer-based Lendon Ka [phone] [email or web address] [address], [place], [place] [address] Delivery and outreach available in [place] and [place], please call for more information. Monday, Tuesday: 1:00 -7:00 pm Thursday: 4:00 pm - 8:00 pm Friday: 1:00 pm - 8:00 pm)   Information on my medicine - ELIQUIS (apixaban)  This medication education was reviewed with me or my healthcare representative as part of my discharge preparation.   Why was Eliquis prescribed for you? Eliquis was prescribed to treat blood clots that may have been found in the veins of your legs (deep vein thrombosis) or in your lungs (pulmonary embolism) and to reduce the risk of them occurring again.  What do You need to know about Eliquis ? The starting dose is 10 mg (two 5 mg tablets) taken TWICE daily for the FIRST SEVEN (7) DAYS, then on 01/17/2023 the dose is reduced to ONE 5 mg tablet taken TWICE daily.  Eliquis may be taken with or without food.   Try to take the dose about the same  time in the morning and in the evening. If you have difficulty swallowing the tablet whole please discuss with your pharmacist how to take the medication safely.  Take Eliquis exactly as prescribed and DO NOT stop taking Eliquis without talking to the doctor who prescribed the medication.  Stopping may increase your risk of developing a new blood clot.  Refill your prescription before you run out.  After discharge, you should have regular check-up appointments with your healthcare provider that is prescribing your Eliquis.    What do you do if you miss a dose? If a dose of ELIQUIS is not taken at the scheduled time, take it as soon as possible on the same day and twice-daily administration should be resumed. The dose should not be doubled to make up for a missed dose.  Important Safety Information A possible side effect of Eliquis is bleeding. You should call your healthcare provider right away if you experience any of the following: Bleeding from an injury or your nose that does not stop. Unusual colored urine (red or dark brown) or unusual colored stools (red or black). Unusual bruising for unknown reasons. A serious fall or if you hit your head (even if there is no bleeding).  Some medicines may interact with Eliquis and might increase your risk of bleeding or clotting while on Eliquis. To help avoid this, consult your healthcare provider or pharmacist prior to using any new prescription or non-prescription medications, including herbals, vitamins, non-steroidal anti-inflammatory drugs (NSAIDs) and supplements.  This website has more information on Eliquis (apixaban): http://www.eliquis.com/eliquis/home   ===========================================  Pulmonary Embolism    A pulmonary embolism (PE) is a sudden blockage or decrease of blood flow in one or both lungs. Most blockages come from a blood clot that forms in the vein of a lower leg, thigh, or arm (deep vein thrombosis, DVT)  and travels to the lungs. A clot is blood that has thickened into a gel or solid. PE is a dangerous and life-threatening condition that needs to be treated right away.  What are the causes? This condition is usually caused by a blood clot that forms in a vein and moves to the lungs. In rare cases, it may be caused by air, fat, part of a tumor, or other tissue that moves through the veins and into the lungs.  What increases the risk? The following factors may make you more likely to develop this condition: Experiencing a traumatic injury, such as breaking a hip or leg. Having: A spinal cord injury. Orthopedic surgery, especially hip or knee replacement. Any major surgery. A stroke. DVT. Blood clots or blood clotting disease. Long-term (chronic) lung or heart disease. Cancer treated with chemotherapy. A central venous catheter. Taking medicines that contain estrogen. These include birth control pills and hormone replacement therapy. Being: Pregnant. In the period of time after your baby is delivered (postpartum). Older than age 69. Overweight. A smoker, especially if you have other risks.  What are the signs or symptoms? Symptoms of this condition usually start suddenly and include: Shortness of breath during activity or at rest. Coughing, coughing up blood, or coughing up blood-tinged mucus. Chest pain that is often worse with deep breaths. Rapid or irregular heartbeat. Feeling light-headed or dizzy. Fainting. Feeling anxious. Fever. Sweating. Pain and swelling in a leg. This is a symptom of DVT, which can lead to PE. How is this diagnosed? This condition may be diagnosed based on: Your medical history. A physical exam. Blood tests. CT pulmonary angiogram. This test checks blood flow in and around your lungs. Ventilation-perfusion scan, also called a lung VQ scan. This test measures air flow and blood flow to the lungs. An ultrasound of the legs.  How is this  treated? Treatment for this condition depends on many factors, such as the cause of your PE, your risk for bleeding or developing more clots, and other medical conditions you have. Treatment aims to remove, dissolve, or stop blood clots from forming or growing larger. Treatment may include: Medicines, such as: Blood thinning medicines (anticoagulants) to stop clots from forming. Medicines that dissolve clots (thrombolytics). Procedures, such as: Using a flexible tube to remove a blood clot (embolectomy) or to deliver medicine to destroy it (catheter-directed thrombolysis). Inserting a filter into a large vein that carries blood to the heart (inferior vena cava). This filter (vena cava filter) catches blood clots before they reach the lungs. Surgery to remove the clot (surgical embolectomy). This is rare. You may need a combination of immediate, long-term (up to 3 months after diagnosis), and extended (more than 3 months after diagnosis) treatments. Your treatment may continue for several months (maintenance therapy). You and your health care provider will work together to choose the treatment program that is best for you.  Follow these instructions at home: Medicines Take over-the-counter and prescription medicines only as told by your health care provider. If you are taking an anticoagulant medicine: Take the medicine every day at the same time each day. Understand what foods and drugs interact with your medicine. Understand the side effects of this medicine, including excessive bruising  or bleeding. Ask your health care provider or pharmacist about other side effects.  General instructions Wear a medical alert bracelet or carry a medical alert card that says you have had a PE and lists what medicines you take. Ask your health care provider when you may return to your normal activities. Avoid sitting or lying for a long time without moving. Maintain a healthy weight. Ask your health care  provider what weight is healthy for you. Do not use any products that contain nicotine or tobacco, such as cigarettes, e-cigarettes, and chewing tobacco. If you need help quitting, ask your health care provider. Talk with your health care provider about any travel plans. It is important to make sure that you are still able to take your medicine while on trips. Keep all follow-up visits as told by your health care provider. This is important.  Contact a health care provider if: You missed a dose of your blood thinner medicine.  Get help right away if: You have: New or increased pain, swelling, warmth, or redness in an arm or leg. Numbness or tingling in an arm or leg. Shortness of breath during activity or at rest. A fever. Chest pain. A rapid or irregular heartbeat. A severe headache. Vision changes. A serious fall or accident, or you hit your head. Stomach (abdominal) pain. Blood in your vomit, stool, or urine. A cut that will not stop bleeding. You cough up blood. You feel light-headed or dizzy. You cannot move your arms or legs. You are confused or have memory loss.  These symptoms may represent a serious problem that is an emergency. Do not wait to see if the symptoms will go away. Get medical help right away. Call your local emergency services (911 in the [place].S[place] Do not drive yourself to the hospital. Summary A pulmonary embolism (PE) is a sudden blockage or decrease of blood flow in one or both lungs. PE is a dangerous and life-threatening condition that needs to be treated right away. Treatments for this condition usually include medicines to thin your blood (anticoagulants) or medicines to break apart blood clots (thrombolytics). If you are given blood thinners, it is important to take the medicine every day at the same time each day. Understand what foods and drugs interact with any medicines that you are taking. If you have signs of PE or DVT, call your local emergency  services (911 in the [place] This information is not intended to replace advice given to you by your health care provider. Make sure you discuss any questions you have with your health care provider. Document Revised: 04/29/2018 Document Reviewed: 04/29/2018 Elsevier Patient Education  2020 [employer].

## 2023-01-08 NOTE — Progress Notes (Signed)
[facility] ADULT ICU REPLACEMENT PROTOCOL   The patient does apply for the [facility] Adult ICU Electrolyte Replacment Protocol based on the criteria listed below:   1.Exclusion criteria: TCTS, ECMO, Dialysis, and Myasthenia Gravis patients 2. Is GFR >/= 30 ml/min? Yes.    Patient's GFR today is >60 3. Is SCr </= 2? Yes.   Patient's SCr is 0.59 mg/dL 4. Did SCr increase >/= 0.5 in 24 hours? No. 5.Pt's weight >40kg  Yes.   6. Abnormal electrolyte(s): K 2.6  7. Electrolytes replaced per protocol 8.  Call MD STAT for K+ </= 2.5, Phos </= 1, or Mag </= 1 Physician:  Mauri Pole, Lakena Sparlin A 01/08/2023 7:39 PM

## 2023-01-09 ENCOUNTER — Inpatient Hospital Stay (HOSPITAL_COMMUNITY): Payer: BC Managed Care – PPO

## 2023-01-09 DIAGNOSIS — R569 Unspecified convulsions: Secondary | ICD-10-CM | POA: Diagnosis not present

## 2023-01-09 DIAGNOSIS — G08 Intracranial and intraspinal phlebitis and thrombophlebitis: Secondary | ICD-10-CM | POA: Diagnosis not present

## 2023-01-09 DIAGNOSIS — G9340 Encephalopathy, unspecified: Secondary | ICD-10-CM | POA: Diagnosis not present

## 2023-01-09 DIAGNOSIS — E871 Hypo-osmolality and hyponatremia: Secondary | ICD-10-CM | POA: Diagnosis not present

## 2023-01-09 LAB — BASIC METABOLIC PANEL
Anion gap: 9 (ref 5–15)
Anion gap: 14 (ref 5–15)
BUN: 11 mg/dL (ref 8–23)
BUN: 8 mg/dL (ref 8–23)
CO2: 26 mmol/L (ref 22–32)
CO2: 26 mmol/L (ref 22–32)
Calcium: 7.5 mg/dL — ABNORMAL LOW (ref 8.9–10.3)
Calcium: 7.9 mg/dL — ABNORMAL LOW (ref 8.9–10.3)
Chloride: 95 mmol/L — ABNORMAL LOW (ref 98–111)
Chloride: 88 mmol/L — ABNORMAL LOW (ref 98–111)
Creatinine, Ser: 0.52 mg/dL — ABNORMAL LOW (ref 0.61–1.24)
Creatinine, Ser: 0.47 mg/dL — ABNORMAL LOW (ref 0.61–1.24)
GFR, Estimated: >60 mL/min (ref >60)
GFR, Estimated: >60 mL/min (ref >60)
Glucose, Bld: 90 mg/dL (ref 70–99)
Glucose, Bld: 105 mg/dL — ABNORMAL HIGH (ref 70–99)
Potassium: 2.8 mmol/L — ABNORMAL LOW (ref 3.5–5.1)
Potassium: 3.4 mmol/L — ABNORMAL LOW (ref 3.5–5.1)
Sodium: 130 mmol/L — ABNORMAL LOW (ref 135–145)
Sodium: 128 mmol/L — ABNORMAL LOW (ref 135–145)

## 2023-01-09 LAB — CBC
HCT: 39.8 % (ref 39.0–52.0)
Hemoglobin: 14.4 g/dL (ref 13.0–17.0)
MCH: 39.7 pg — ABNORMAL HIGH (ref 26.0–34.0)
MCHC: 36.2 g/dL — ABNORMAL HIGH (ref 30.0–36.0)
MCV: 109.6 fL — ABNORMAL HIGH (ref 80.0–100.0)
Platelets: 119 10*3/uL — ABNORMAL LOW (ref 150–400)
RBC: 3.63 MIL/uL — ABNORMAL LOW (ref 4.22–5.81)
RDW: 14.3 % (ref 11.5–15.5)
WBC: 7.8 10*3/uL (ref 4.0–10.5)
nRBC: 0 % (ref 0.0–0.2)

## 2023-01-09 LAB — GLUCOSE, CAPILLARY
Glucose-Capillary: 100 mg/dL — ABNORMAL HIGH (ref 70–99)
Glucose-Capillary: 100 mg/dL — ABNORMAL HIGH (ref 70–99)
Glucose-Capillary: 114 mg/dL — ABNORMAL HIGH (ref 70–99)
Glucose-Capillary: 114 mg/dL — ABNORMAL HIGH (ref 70–99)
Glucose-Capillary: 118 mg/dL — ABNORMAL HIGH (ref 70–99)
Glucose-Capillary: 124 mg/dL — ABNORMAL HIGH (ref 70–99)
Glucose-Capillary: 83 mg/dL (ref 70–99)

## 2023-01-09 LAB — MAGNESIUM: Magnesium: 1.5 mg/dL — ABNORMAL LOW (ref 1.7–2.4)

## 2023-01-09 LAB — PHOSPHORUS: Phosphorus: 2.1 mg/dL — ABNORMAL LOW (ref 2.5–4.6)

## 2023-01-09 LAB — MISC LABCORP TEST (SEND OUT): Labcorp test code: 83935

## 2023-01-09 LAB — HEPARIN LEVEL (UNFRACTIONATED)
Heparin Unfractionated: 0.15 IU/mL — ABNORMAL LOW (ref 0.30–0.70)
Heparin Unfractionated: 0.18 IU/mL — ABNORMAL LOW (ref 0.30–0.70)
Heparin Unfractionated: 0.5 IU/mL (ref 0.30–0.70)

## 2023-01-09 LAB — SODIUM: Sodium: 130 mmol/L — ABNORMAL LOW (ref 135–145)

## 2023-01-09 MED ORDER — HEPARIN (PORCINE) 25000 UT/250ML-% IV SOLN
1850.0000 [IU]/h | INTRAVENOUS | Status: DC
  Administered 2023-01-09 – 2023-01-10 (×2): 1650 [IU]/h via INTRAVENOUS
  Filled 2023-01-09: qty 250

## 2023-01-09 MED ORDER — HEPARIN BOLUS VIA INFUSION
3000.0000 [IU] | Freq: Once | INTRAVENOUS | Status: AC
  Administered 2023-01-09: 3000 [IU] via INTRAVENOUS
  Filled 2023-01-09: qty 3000

## 2023-01-09 MED ORDER — POTASSIUM PHOSPHATES 15 MMOLE/5ML IV SOLN
45.0000 mmol | Freq: Once | INTRAVENOUS | Status: AC
  Administered 2023-01-09: 45 mmol via INTRAVENOUS
  Filled 2023-01-09: qty 15

## 2023-01-09 MED ORDER — POTASSIUM CHLORIDE 10 MEQ/100ML IV SOLN
10.0000 meq | INTRAVENOUS | Status: AC
  Administered 2023-01-09 (×4): 10 meq via INTRAVENOUS
  Filled 2023-01-09 (×4): qty 100

## 2023-01-09 MED ORDER — MAGNESIUM SULFATE 4 GM/100ML IV SOLN
4.0000 g | Freq: Once | INTRAVENOUS | Status: AC
  Administered 2023-01-09: 4 g via INTRAVENOUS
  Filled 2023-01-09: qty 100

## 2023-01-09 MED ORDER — POTASSIUM CHLORIDE 20 MEQ PO PACK
40.0000 meq | PACK | Freq: Once | ORAL | Status: AC
  Administered 2023-01-09: 40 meq
  Filled 2023-01-09: qty 2

## 2023-01-09 NOTE — Progress Notes (Signed)
ANTICOAGULATION CONSULT NOTE - Follow Up Consult  Pharmacy Consult for heparin Indication:  cerebral venous sinus thrombosis  Labs: Recent Labs    01/07/23 1621 01/08/23 0131 01/08/23 0228 01/08/23 0732 01/08/23 1813 01/09/23 0300  HGB 17.9* 16.1 15.6 15.9  --  14.4  HCT RESULTS UNAVAILABLE DUE TO INTERFERING SUBSTANCE 42.1 46.0 42.4  --  39.8  PLT 135* 121*  --  115*  --  119*  HEPARINUNFRC  --   --   --   --   --  0.15*  CREATININE 1.01 0.75  --  0.75 0.59*  --   CKTOTAL 675*  --   --   --   --   --   TROPONINIHS 14  --   --  21*  --   --     Assessment: 61yo male subtherapeutic on heparin with initial dosing for extensive CVST; no infusion issues or signs of bleeding per RN.  Goal of Therapy:  Heparin level 0.3-0.7 units/ml   Plan:  3000 units heparin bolus. Increase heparin infusion by 3-4 units/kg/hr to 1400 units/hr. Check level in 6 hours.   Vernard Gambles, PharmD, BCPS 01/09/2023 4:57 AM

## 2023-01-09 NOTE — Progress Notes (Signed)
ANTICOAGULATION CONSULT NOTE - follow-up  Pharmacy Consult for Heparin Indication: CVST without hemorrhage  Allergies  Allergen Reactions   Effexor [Venlafaxine Hydrochloride] Other (See Comments)    unknown    Patient Measurements: Height: 5\' 10"  (177.8 cm) Weight: 68.3 kg (150 lb 9.2 oz) IBW/kg (Calculated) : 73 Heparin Dosing Weight: 70 kg  Vital Signs: Temp: 98.1 F (36.7 C) (06/06 1538) Temp Source: Oral (06/06 1538) BP: 126/86 (06/06 1800) Pulse Rate: 61 (06/06 1800)  Labs: Recent Labs    01/07/23 1621 01/08/23 0131 01/08/23 0228 01/08/23 0732 01/08/23 0733 01/08/23 1813 01/09/23 0257 01/09/23 0300 01/09/23 1018 01/09/23 1717  HGB 17.9* 16.1 15.6 15.9  --   --   --  14.4  --   --   HCT RESULTS UNAVAILABLE DUE TO INTERFERING SUBSTANCE 42.1 46.0 42.4  --   --   --  39.8  --   --   PLT 135* 121*  --  115*  --   --   --  119*  --   --   HEPARINUNFRC  --   --   --   --   --   --   --  0.15* 0.18* 0.50  CREATININE 1.01 0.75  --  0.75   < > 0.59* 0.52*  --   --  0.47*  CKTOTAL 675*  --   --   --   --   --   --   --   --   --   TROPONINIHS 14  --   --  21*  --   --   --   --   --   --    < > = values in this interval not displayed.     Estimated Creatinine Clearance: 92.5 mL/min (A) (by C-G formula based on SCr of 0.47 mg/dL (L)).   Medical History: Past Medical History:  Diagnosis Date   Chronic constipation    COPD (chronic obstructive pulmonary disease) (HCC)    Depression    GERD (gastroesophageal reflux disease)    Headache    Hypertension     Medications:  Medications Prior to Admission  Medication Sig Dispense Refill Last Dose   labetalol (NORMODYNE) 300 MG tablet Take 300 mg by mouth 2 (two) times daily.      albuterol (PROVENTIL) (2.5 MG/3ML) 0.083% nebulizer solution Take 2.5 mg by nebulization every 6 (six) hours as needed.        amLODipine (NORVASC) 10 MG tablet Take 10 mg by mouth daily.  0    buPROPion (WELLBUTRIN XL) 150 MG 24 hr  tablet Take 150 mg by mouth daily.      Fluticasone-Umeclidin-Vilant (TRELEGY ELLIPTA) 100-62.5-25 MCG/INH AEPB Inhale 1 puff into the lungs daily. 2 each 0    labetalol (NORMODYNE) 200 MG tablet Take 1 tablet (200 mg total) by mouth 2 (two) times daily. 60 tablet 3    losartan (COZAAR) 100 MG tablet Take 1 tablet (100 mg total) by mouth daily. 30 tablet 3    omeprazole (PRILOSEC) 20 MG capsule       SPIRIVA HANDIHALER 18 MCG inhalation capsule       sucralfate (CARAFATE) 1 g tablet       triamterene-hydrochlorothiazide (MAXZIDE-25) 37.5-25 MG tablet Take 1 tablet by mouth daily. 30 tablet 3    VENTOLIN HFA 108 (90 BASE) MCG/ACT inhaler Inhale 2 puffs into the lungs every 4 (four) hours as needed for shortness of breath.  3  Assessment: 53 YOM admitted after being found on the floor by ex-wife. Patient has an extensive VTE requiring AC. The patient is not on any known AC therapy PTA. Found to have CVST on MRI 6/5.  Okay per neuro for normal heparin level goal + boluses.  Heparin level 0.18 units/mL (subtherapeutic) on heparin 1400 units/hr. CBC stable with no signs of bleeding.  6/6 PM update: HL 0.50 No signs of bleeding or issues with gtt  Goal of Therapy:  Heparin level 0.3-0.7 units/ml Monitor platelets by anticoagulation protocol: Yes   Plan:  Continue  heparin at 1650 units/hr Daily CBC and heparin level  Mel Langan BS, PharmD, BCPS Clinical Pharmacist 01/09/2023 7:54 PM  Contact: [phone] after 3 PM  "Be curious, not judgmental..." -Debbora Dus

## 2023-01-09 NOTE — Progress Notes (Signed)
2nd large liquid stool. When changing the bed and bathing him, he b/c agitated and aggressive to staff attempting to kick staff and sit up in bed. He is already on Precedex gtt at max 2 mcg/kg/hr. Gave 2mg  Ativan IV per PRN order. During this episode, all attempts to calm him were unsuccessful. Continuing to monitor.

## 2023-01-09 NOTE — Progress Notes (Signed)
[facility] ADULT ICU REPLACEMENT PROTOCOL   The patient does apply for the [facility] Adult ICU Electrolyte Replacment Protocol based on the criteria listed below:   1.Exclusion criteria: TCTS, ECMO, Dialysis, and Myasthenia Gravis patients 2. Is GFR >/= 30 ml/min? Yes.    Patient's GFR today is >60 3. Is SCr </= 2? Yes.   Patient's SCr is 0.59 mg/dL 4. Did SCr increase >/= 0.5 in 24 hours? No. 5.Pt's weight >40kg  Yes.   6. Abnormal electrolyte(s): K 2.8, mag 1.5  7. Electrolytes replaced per protocol 8.  Call MD STAT for K+ </= 2.5, Phos </= 1, or Mag </= 1 Physician:  Mauri Pole, Mycheal Veldhuizen A 01/09/2023 6:18 AM

## 2023-01-09 NOTE — Consult Note (Signed)
NEUROLOGY CONSULTATION NOTE   Date of service: January 09, 2023 Patient Name: Isaac Rose MRN:  [id number] DOB:  [birth year] Reason for consult: "Dural venous sinus thrombosis" Requesting Provider: Oretha Milch, MD _ _ _   _ __   _ __ _ _  __ __   _ __   __ _  History of Present Illness  Isaac Rose is a 62 y.o. male with PMH significant for COPD, HTN, tobacco use, GERD who was found on the floor altered. CTH concerning for dural venous sinus thrombosis. MRI brain and C spine with extensive thrombosis of the superior sagittal sinus, right transverse sinus, extending into the upper right internal jugular vein. Lesser, nonocclusive thrombus within the left transverse and sigmoid venous sinuses. Some superficial venous thrombosis over the parietal convexities as well.  Has been vomiting over the weekend. Labs with sodium of 115 on arrival to the ED. Uds negative. Ammonia normal.  Neurology consulted for assistance with management of CVST.  Does have a hx of EtOh use.   ROS   Unable to obtain reliable ROS 2/2 encephalopathy.  Past History   Past Medical History:  Diagnosis Date   Chronic constipation    COPD (chronic obstructive pulmonary disease) (HCC)    Depression    GERD (gastroesophageal reflux disease)    Headache    Hypertension    Past Surgical History:  Procedure Laterality Date   APPENDECTOMY     COLONOSCOPY  08   NUR   head trauma     from trauma   UPPER GASTROINTESTINAL ENDOSCOPY     Family History  Problem Relation Age of Onset   Lung cancer Mother    Emphysema Father    Kidney cancer Sister    Heart disease Brother    Aneurysm Sister    Breast cancer Sister    Colon cancer Maternal Aunt    Colon cancer Maternal Uncle        x4   Social History   Socioeconomic History   Marital status: Legally Separated    Spouse name: Not on file   Number of children: Not on file   Years of education: Not on file   Highest education level: Not on file   Occupational History   Not on file  Tobacco Use   Smoking status: Every Day    Packs/day: 3.00    Years: 48.00    Additional pack years: 0.00    Total pack years: 144.00    Types: Cigarettes    Start date: 41   Smokeless tobacco: Former    Types: Chew    Quit date: 04/29/1987   Tobacco comments:    1 1/2 pack a day 34 yrs patient is aware he needs to quit   Vaping Use   Vaping Use: Never used  Substance and Sexual Activity   Alcohol use: Yes    Alcohol/week: 0.0 standard drinks of alcohol    Comment: occasionally   Drug use: No   Sexual activity: Never    Partners: Female  Other Topics Concern   Not on file  Social History Narrative   Not on file   Social Determinants of Health   Financial Resource Strain: Not on file  Food Insecurity: Not on file  Transportation Needs: Not on file  Physical Activity: Not on file  Stress: Not on file  Social Connections: Not on file   Allergies  Allergen Reactions   Effexor [Venlafaxine Hydrochloride] Other (See Comments)  unknown    Medications   Medications Prior to Admission  Medication Sig Dispense Refill Last Dose   labetalol (NORMODYNE) 300 MG tablet Take 300 mg by mouth 2 (two) times daily.      albuterol (PROVENTIL) (2.5 MG/3ML) 0.083% nebulizer solution Take 2.5 mg by nebulization every 6 (six) hours as needed.        amLODipine (NORVASC) 10 MG tablet Take 10 mg by mouth daily.  0    buPROPion (WELLBUTRIN XL) 150 MG 24 hr tablet Take 150 mg by mouth daily.      Fluticasone-Umeclidin-Vilant (TRELEGY ELLIPTA) 100-62.5-25 MCG/INH AEPB Inhale 1 puff into the lungs daily. 2 each 0    labetalol (NORMODYNE) 200 MG tablet Take 1 tablet (200 mg total) by mouth 2 (two) times daily. 60 tablet 3    losartan (COZAAR) 100 MG tablet Take 1 tablet (100 mg total) by mouth daily. 30 tablet 3    omeprazole (PRILOSEC) 20 MG capsule       SPIRIVA HANDIHALER 18 MCG inhalation capsule       sucralfate (CARAFATE) 1 g tablet        triamterene-hydrochlorothiazide (MAXZIDE-25) 37.5-25 MG tablet Take 1 tablet by mouth daily. 30 tablet 3    VENTOLIN HFA 108 (90 BASE) MCG/ACT inhaler Inhale 2 puffs into the lungs every 4 (four) hours as needed for shortness of breath.  3      Vitals   Vitals:   01/09/23 0900 01/09/23 1000 01/09/23 1100 01/09/23 1128  BP: (!) 140/80 136/82 122/66   Pulse: (!) 56 (!) 57 60   Resp: 19 (!) 27 (!) 25   Temp:    98 F (36.7 C)  TempSrc:    Axillary  SpO2: 98% 100% 100%   Weight:      Height:         Body mass index is 21.61 kg/m.  Physical Exam   General: Laying comfortably in bed; in no acute distress.  HENT: Normal oropharynx and mucosa. Normal external appearance of ears and nose.  Neck: Supple, no pain or tenderness  CV: No JVD. No peripheral edema.  Pulmonary: Symmetric Chest rise. Normal respiratory effort.  Abdomen: Soft to touch, non-tender.  Ext: No cyanosis, edema, or deformity  Skin: No rash. Normal palpation of skin.   Musculoskeletal: Normal digits and nails by inspection. No clubbing.   Neurologic Examination  Mental status/Cognition: somnolent, oriented to self, place. Month but not year. Poor attention and keeps dozing off during our conversation. Speech/language: dysarthric speech, non fluent, comprehension intact to simple commands, object naming intact. Cranial nerves:   CN II Pupils equal and reactive to light, no VF deficits   CN III,IV,VI EOM intact, no gaze preference or deviation, no nystagmus    CN V normal sensation in V1, V2, and V3 segments bilaterally    CN VII no asymmetry, no nasolabial fold flattening    CN VIII normal hearing to speech    CN IX & X normal palatal elevation, no uvular deviation    CN XI 5/5 head turn and 5/5 shoulder shrug bilaterally    CN XII midline tongue protrusion    Motor:  Muscle bulk: poor, tone normal Moves all extremities spontaneously and antigravity. Unable to do detailed strength testing secondary to  encephalopathy.   Sensation:  Light touch Intact throughout   Pin prick    Temperature    Vibration   Proprioception    Coordination/Complex Motor:  - Finger to Nose intact BL -  Heel to shin unable to do - Rapid alternating movement unable to do - Gait: deferred.  Labs   CBC:  Recent Labs  Lab 01/07/23 1621 01/08/23 0131 01/08/23 0732 01/09/23 0300  WBC 11.7*   < > 7.8 7.8  NEUTROABS 8.7*  --   --   --   HGB 17.9*   < > 15.9 14.4  HCT RESULTS UNAVAILABLE DUE TO INTERFERING SUBSTANCE   < > 42.4 39.8  MCV RESULTS UNAVAILABLE DUE TO INTERFERING SUBSTANCE   < > 106.5* 109.6*  PLT 135*   < > 115* 119*   < > = values in this interval not displayed.    Basic Metabolic Panel:  Lab Results  Component Value Date   NA 130 (L) 01/09/2023   K 2.8 (L) 01/09/2023   CO2 26 01/09/2023   GLUCOSE 90 01/09/2023   BUN 11 01/09/2023   CREATININE 0.52 (L) 01/09/2023   CALCIUM 7.5 (L) 01/09/2023   GFRNONAA >60 01/09/2023   GFRAA >60 06/05/2018   Lipid Panel: No results found for: "LDLCALC" HgbA1c: No results found for: "HGBA1C" Urine Drug Screen:     Component Value Date/Time   LABOPIA NONE DETECTED 01/07/2023 1635   COCAINSCRNUR NONE DETECTED 01/07/2023 1635   LABBENZ NONE DETECTED 01/07/2023 1635   AMPHETMU NONE DETECTED 01/07/2023 1635   THCU NONE DETECTED 01/07/2023 1635   LABBARB NONE DETECTED 01/07/2023 1635    Alcohol Level     Component Value Date/Time   ETH <10 01/07/2023 1621    CT Head without contrast(Personally reviewed): Dense venous sinus thrombosis, concerning for CVST.  MRI Brain(Personally reviewed): Extensive dural venous sinus thrombosis  rEEG:  Neg for seizure  Impression   Isaac Rose is a 62 y.o. male with PMH significant for COPD, HTN, tobacco use, GERD who was found on the floor altered. CTH concerning for dural venous sinus thrombosis. MRI brain and C spine with extensive thrombosis of the superior sagittal sinus, right transverse  sinus, extending into the upper right internal jugular vein. Lesser, nonocclusive thrombus within the left transverse and sigmoid venous sinuses. Some superficial venous thrombosis over the parietal convexities as well.  Has been vomiting over the weekend. Labs with sodium of 115 on arrival to the ED. Uds negative. Ammonia normal.  Neurology consulted for assistance with management of CVST.  Does have a hx of EtOh use.  Recommendations  - Agree with Heparin gtt for acute treatment - transition to Eliquis 5mg  BID after a few days of heparin. - no evidence of stroke on imaging, suspect encephalopathy likely 2/2 hyponatremia and EtOH withdawal and improving and thus not a candidate for thrombectomy. ______________________________________________________________________   Thank you for the opportunity to take part in the care of this patient. If you have any further questions, please contact the neurology consultation attending.  Signed,  Erick Blinks [facility] _ _ _   _ __   _ __ _ _  __ __   _ __   __ _

## 2023-01-09 NOTE — Progress Notes (Signed)
Notified Gretchin, RN of K 2.8 and Mg 1.5.

## 2023-01-09 NOTE — Progress Notes (Signed)
EEG complete - results pending 

## 2023-01-09 NOTE — Progress Notes (Signed)
NAME:  Isaac Rose, MRN:  [id number], DOB:  [birth year], LOS: 2 ADMISSION DATE:  01/07/2023, CONSULTATION DATE:  01/07/2023 REFERRING MD: Criss Alvine - EDP CHIEF COMPLAINT:  AMS   History of Present Illness:  62 yo male smoker presented to [facility] ER after being found on the floor at home by his ex-wife.  Na 115, and CT head showed dense appearance of the dural venous sinuses, particularly the R transverse/sigmoid sinus (possible artifactual).  Transferred to [facility] for further management.  Pertinent Medical History:  Tobacco abuse- heavy, COPD, GERD, HTN, depression, constipation, headaches  Significant Hospital Events: Including procedures, antibiotic start and stop dates in addition to other pertinent events   6/4 - Presented to [facility] for AMS. Found down by ex-wife at home, altered and with scalp abrasions. CT Head with dense appearance of the dural venous sinuses, particularly the R transverse/sigmoid sinus (possible artifactual); CT C-spine negative. Na 115, K 2.7. 6/5 MRI brain >>  extensive thrombosis of the superior sagittal sinus, right transverse sinus, extending into the upper right internal jugular vein. Lesser, nonocclusive thrombus within the left transverse and sigmoid venous sinuses. Some superficial venous thrombosis over the parietal convexities as well  Interim History / Subjective:   Critically ill, remains on Precedex and 4 point restraints. Intermittent agitation with kicking per RN Afebrile On 2 L nasal cannula 4 L urine output  Objective:  Blood pressure 126/80, pulse (!) 58, temperature 98 F (36.7 C), temperature source Axillary, resp. rate (!) 21, height 5\' 10"  (1.778 m), weight 68.3 kg, SpO2 98 %.        Intake/Output Summary (Last 24 hours) at 01/09/2023 1248 Last data filed at 01/09/2023 1200 Gross per 24 hour  Intake 4217.7 ml  Output 3450 ml  Net 767.7 ml    Filed Weights   01/07/23 1607 01/07/23 2003 01/08/23 0710  Weight: 70 kg 67.9 kg 68.3 kg   Physical  Examination:  General - sedate on Precedex, acutely ill-appearing Eyes - pupils 3 mm reactive to light ENT - no sinus tenderness, no stridor Cardiac - regular rate/rhythm, no murmur Chest - equal breath sounds b/l, no wheezing or rales Abdomen - soft, non tender, + bowel sounds Extremities - no cyanosis, clubbing, or edema Skin - no rashes Neuro - RASS -2 , does not follow commands,    Labs show hypomagnesemia, hypokalemia, hypophosphatemia, low sodium 130, mild thrombocytopenia  Resolved Hospital Problem List:    Assessment & Plan:   Acute metabolic encephalopathy from hypovolemic hyponatremia in setting of presumed ETOH. Acute alcohol withdrawal with delirium. 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 -IV heparin, consult neurology  Hypokalemia. Hypomagnesemia. Hypophosphatemia -Replete and recheck  COPD with tobacco abuse. - brovana, yupelri, pulmicort - prn albuterol - tobacco cessation  Hx of HTN. - continue norvasc - hold outpt labetalol, cozaar, triamterene, HCTZ - goal BP normotensive  Hx of GERD. - protonix  Best Practice (right click and "Reselect all SmartList Selections" daily)   Diet/type: tubefeeds DVT prophylaxis: systemic heparin GI prophylaxis: PPI Lines: N/A Foley:  N/A Code Status:  full code Last date of multidisciplinary goals of care discussion [Pending]  Labs       Latest Ref Rng & Units 01/09/2023  3:00 AM 01/09/2023    2:57 AM 01/08/2023    6:14 PM  CMP  Glucose 70 - 99 mg/dL  90    BUN 8 - 23 mg/dL  11    Creatinine 1.61 - 1.24 mg/dL  0.96    Sodium 045 - 409 mmol/L 130  130  129   Potassium 3.5 - 5.1 mmol/L  2.8    Chloride 98 - 111 mmol/L  95    CO2 22 - 32 mmol/L  26     Calcium 8.9 - 10.3 mg/dL  7.5         Latest Ref Rng & Units 01/09/2023    3:00 AM 01/08/2023    7:32 AM 01/08/2023    2:28 AM  CBC  WBC 4.0 - 10.5 K/uL 7.8  7.8    Hemoglobin 13.0 - 17.0 g/dL 81.1  91.4  78.2   Hematocrit 39.0 - 52.0 % 39.8  42.4  46.0   Platelets 150 - 400 K/uL 119  115      ABG    Component Value Date/Time   PHART 7.527 (H) 01/08/2023 0228   PCO2ART 31.7 (L) 01/08/2023 0228   PO2ART 70 (L) 01/08/2023 0228   HCO3 26.3 01/08/2023 0228   TCO2 27 01/08/2023 0228   ACIDBASEDEF 1.6 10/09/2010 0900   O2SAT 96 01/08/2023 0228    CBG (last 3)  Recent Labs    01/09/23 0339 01/09/23 0807 01/09/23 1126  GLUCAP 83 114* 124*      Critical care time: 33 minutes   Cyril Mourning MD. FCCP. [facility] Pager : [phone]  If no response to pager , please call [phone] until 7 pm After 7:00 pm call Elink  [phone]     01/09/2023, 12:48 PM

## 2023-01-09 NOTE — Procedures (Signed)
Patient Name: Isaac Rose  MRN: [id number]  Epilepsy Attending: Charlsie Quest  Referring Physician/Provider: Erick Blinks, MD  Date: 01/09/2023 Duration: 27.42 mins  Patient history: 62yo M with Hx of etoh use, presenting with confusion, vomiting, r sided abrasions, 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 EEG to evaluate for seizure.   Level of alertness: Awake, asleep  AEDs during EEG study: None  Technical aspects: This EEG study was done with scalp electrodes positioned according to the 10-20 International system of electrode placement. Electrical activity was reviewed with band pass filter of 1-70Hz , sensitivity of 7 uV/mm, display speed of 80mm/sec with a 60Hz  notched filter applied as appropriate. EEG data were recorded continuously and digitally stored.  Video monitoring was available and reviewed as appropriate.  Description: No posterior dominant rhythm was seen. Sleep was characterized by vertex waves, sleep spindles (12 to 14 Hz), maximal frontocentral region. EEG showed continuous generalized 3 to 6 Hz theta-delta slowing.  Hyperventilation and photic stimulation were not performed.     ABNORMALITY - Continuous slow, generalized  IMPRESSION: This study is suggestive of moderate diffuse encephalopathy, nonspecific etiology. No seizures or epileptiform discharges were seen throughout the recording.  If suspicion for interictal activity remains a concern, a prolonged study can be considered.   Dolan Xia Annabelle Harman

## 2023-01-09 NOTE — Progress Notes (Addendum)
ANTICOAGULATION CONSULT NOTE - Initial Consult  Pharmacy Consult for Heparin Indication: CVST without hemorrhage  Allergies  Allergen Reactions   Effexor [Venlafaxine Hydrochloride] Other (See Comments)    unknown    Patient Measurements: Height: 5\' 10"  (177.8 cm) Weight: 68.3 kg (150 lb 9.2 oz) IBW/kg (Calculated) : 73 Heparin Dosing Weight: 70 kg  Vital Signs: Temp: 98 F (36.7 C) (06/06 1128) Temp Source: Axillary (06/06 1128) BP: 122/66 (06/06 1100) Pulse Rate: 60 (06/06 1100)  Labs: Recent Labs    01/07/23 1621 01/08/23 0131 01/08/23 0228 01/08/23 0732 01/08/23 0733 01/08/23 1813 01/09/23 0257 01/09/23 0300 01/09/23 1018  HGB 17.9* 16.1 15.6 15.9  --   --   --  14.4  --   HCT RESULTS UNAVAILABLE DUE TO INTERFERING SUBSTANCE 42.1 46.0 42.4  --   --   --  39.8  --   PLT 135* 121*  --  115*  --   --   --  119*  --   HEPARINUNFRC  --   --   --   --   --   --   --  0.15* 0.18*  CREATININE 1.01 0.75  --  0.75 0.75 0.59* 0.52*  --   --   CKTOTAL 675*  --   --   --   --   --   --   --   --   TROPONINIHS 14  --   --  21*  --   --   --   --   --      Estimated Creatinine Clearance: 92.5 mL/min (A) (by C-G formula based on SCr of 0.52 mg/dL (L)).   Medical History: Past Medical History:  Diagnosis Date   Chronic constipation    COPD (chronic obstructive pulmonary disease) (HCC)    Depression    GERD (gastroesophageal reflux disease)    Headache    Hypertension     Medications:  Medications Prior to Admission  Medication Sig Dispense Refill Last Dose   labetalol (NORMODYNE) 300 MG tablet Take 300 mg by mouth 2 (two) times daily.      albuterol (PROVENTIL) (2.5 MG/3ML) 0.083% nebulizer solution Take 2.5 mg by nebulization every 6 (six) hours as needed.        amLODipine (NORVASC) 10 MG tablet Take 10 mg by mouth daily.  0    buPROPion (WELLBUTRIN XL) 150 MG 24 hr tablet Take 150 mg by mouth daily.      Fluticasone-Umeclidin-Vilant (TRELEGY ELLIPTA)  100-62.5-25 MCG/INH AEPB Inhale 1 puff into the lungs daily. 2 each 0    labetalol (NORMODYNE) 200 MG tablet Take 1 tablet (200 mg total) by mouth 2 (two) times daily. 60 tablet 3    losartan (COZAAR) 100 MG tablet Take 1 tablet (100 mg total) by mouth daily. 30 tablet 3    omeprazole (PRILOSEC) 20 MG capsule       SPIRIVA HANDIHALER 18 MCG inhalation capsule       sucralfate (CARAFATE) 1 g tablet       triamterene-hydrochlorothiazide (MAXZIDE-25) 37.5-25 MG tablet Take 1 tablet by mouth daily. 30 tablet 3    VENTOLIN HFA 108 (90 BASE) MCG/ACT inhaler Inhale 2 puffs into the lungs every 4 (four) hours as needed for shortness of breath.  3     Assessment: 83 YOM admitted after being found on the floor by ex-wife. Patient has an extensive VTE requiring AC. The patient is not on any known AC therapy PTA. Found to have  CVST on MRI 6/5.  Okay per neuro for normal heparin level goal + boluses.  Heparin level 0.18 units/mL (subtherapeutic) on heparin 1400 units/hr. CBC stable with no signs of bleeding.  Goal of Therapy:  Heparin level 0.3-0.7 units/ml Monitor platelets by anticoagulation protocol: Yes   Plan:  Give 3000 units IV bolus then increase heparin to 1650 units/hr Check heparin level in 6 hours Daily CBC and heparin level  Eldridge Scot, PharmD Clinical Pharmacist 01/09/2023 11:29 AM

## 2023-01-10 ENCOUNTER — Inpatient Hospital Stay (HOSPITAL_COMMUNITY): Payer: BC Managed Care – PPO

## 2023-01-10 ENCOUNTER — Other Ambulatory Visit (HOSPITAL_COMMUNITY): Payer: Self-pay

## 2023-01-10 DIAGNOSIS — E871 Hypo-osmolality and hyponatremia: Secondary | ICD-10-CM | POA: Diagnosis not present

## 2023-01-10 DIAGNOSIS — I10 Essential (primary) hypertension: Secondary | ICD-10-CM | POA: Diagnosis not present

## 2023-01-10 LAB — PHOSPHORUS: Phosphorus: 2.9 mg/dL (ref 2.5–4.6)

## 2023-01-10 LAB — HEPARIN LEVEL (UNFRACTIONATED)
Heparin Unfractionated: <0.1 IU/mL — ABNORMAL LOW (ref 0.30–0.70)
Heparin Unfractionated: 0.16 IU/mL — ABNORMAL LOW (ref 0.30–0.70)

## 2023-01-10 LAB — CBC
HCT: 36.8 % — ABNORMAL LOW (ref 39.0–52.0)
Hemoglobin: 13.4 g/dL (ref 13.0–17.0)
MCH: 40.1 pg — ABNORMAL HIGH (ref 26.0–34.0)
MCHC: 36.4 g/dL — ABNORMAL HIGH (ref 30.0–36.0)
MCV: 110.2 fL — ABNORMAL HIGH (ref 80.0–100.0)
Platelets: 148 10*3/uL — ABNORMAL LOW (ref 150–400)
RBC: 3.34 MIL/uL — ABNORMAL LOW (ref 4.22–5.81)
RDW: 14.7 % (ref 11.5–15.5)
WBC: 6.6 10*3/uL (ref 4.0–10.5)
nRBC: 0 % (ref 0.0–0.2)

## 2023-01-10 LAB — RAPID URINE DRUG SCREEN, HOSP PERFORMED
Amphetamines: NOT DETECTED
Barbiturates: NOT DETECTED
Benzodiazepines: POSITIVE — AB
Cocaine: NOT DETECTED
Opiates: NOT DETECTED
Tetrahydrocannabinol: NOT DETECTED

## 2023-01-10 LAB — GLUCOSE, CAPILLARY
Glucose-Capillary: 103 mg/dL — ABNORMAL HIGH (ref 70–99)
Glucose-Capillary: 104 mg/dL — ABNORMAL HIGH (ref 70–99)
Glucose-Capillary: 128 mg/dL — ABNORMAL HIGH (ref 70–99)
Glucose-Capillary: 130 mg/dL — ABNORMAL HIGH (ref 70–99)
Glucose-Capillary: 133 mg/dL — ABNORMAL HIGH (ref 70–99)
Glucose-Capillary: 94 mg/dL (ref 70–99)

## 2023-01-10 LAB — BASIC METABOLIC PANEL
Anion gap: 8 (ref 5–15)
BUN: 6 mg/dL — ABNORMAL LOW (ref 8–23)
CO2: 26 mmol/L (ref 22–32)
Calcium: 7.8 mg/dL — ABNORMAL LOW (ref 8.9–10.3)
Chloride: 94 mmol/L — ABNORMAL LOW (ref 98–111)
Creatinine, Ser: 0.61 mg/dL (ref 0.61–1.24)
GFR, Estimated: >60 mL/min (ref >60)
Glucose, Bld: 129 mg/dL — ABNORMAL HIGH (ref 70–99)
Potassium: 3.2 mmol/L — ABNORMAL LOW (ref 3.5–5.1)
Sodium: 128 mmol/L — ABNORMAL LOW (ref 135–145)

## 2023-01-10 LAB — LIPID PANEL
Cholesterol: 124 mg/dL (ref 0–200)
HDL: 59 mg/dL (ref >40)
LDL Cholesterol: 50 mg/dL (ref 0–99)
Total CHOL/HDL Ratio: 2.1 RATIO
Triglycerides: 77 mg/dL (ref <150)
VLDL: 15 mg/dL (ref 0–40)

## 2023-01-10 LAB — MAGNESIUM: Magnesium: 1.4 mg/dL — ABNORMAL LOW (ref 1.7–2.4)

## 2023-01-10 LAB — ECHOCARDIOGRAM COMPLETE
AR max vel: 2.33 cm2
AV Area VTI: 2.42 cm2
AV Area mean vel: 2.16 cm2
AV Mean grad: 6 mmHg
AV Peak grad: 9.4 mmHg
Ao pk vel: 1.53 m/s
Area-P 1/2: 2.29 cm2
Height: 70 in
Weight: 2409.19 oz

## 2023-01-10 LAB — HEMOGLOBIN A1C
Hgb A1c MFr Bld: 5.3 % (ref 4.8–5.6)
Mean Plasma Glucose: 105.41 mg/dL

## 2023-01-10 LAB — ANTITHROMBIN III: AntiThromb III Func: 71 % — ABNORMAL LOW (ref 75–120)

## 2023-01-10 MED ORDER — POLYETHYLENE GLYCOL 3350 17 G PO PACK: 17.0000 g | PACK | Freq: Every day | ORAL | Status: DC | PRN

## 2023-01-10 MED ORDER — SENNOSIDES-DOCUSATE SODIUM 8.6-50 MG PO TABS
1.0000 | ORAL_TABLET | Freq: Every day | ORAL | Status: DC
  Administered 2023-01-10 – 2023-01-16 (×6): 1 via ORAL
  Filled 2023-01-10 (×6): qty 1

## 2023-01-10 MED ORDER — IOHEXOL 9 MG/ML PO SOLN
500.0000 mL | ORAL | Status: AC
  Administered 2023-01-10 (×2): 500 mL via ORAL

## 2023-01-10 MED ORDER — ORAL CARE MOUTH RINSE: 15.0000 mL | OROMUCOSAL | Status: DC | PRN

## 2023-01-10 MED ORDER — ORAL CARE MOUTH RINSE
15.0000 mL | OROMUCOSAL | Status: DC
  Administered 2023-01-10 – 2023-01-16 (×22): 15 mL via OROMUCOSAL

## 2023-01-10 MED ORDER — DOCUSATE SODIUM 100 MG PO CAPS: 100.0000 mg | ORAL_CAPSULE | Freq: Two times a day (BID) | ORAL | Status: DC | PRN

## 2023-01-10 MED ORDER — MAGNESIUM SULFATE 4 GM/100ML IV SOLN
4.0000 g | Freq: Once | INTRAVENOUS | Status: AC
  Administered 2023-01-10: 4 g via INTRAVENOUS
  Filled 2023-01-10: qty 100

## 2023-01-10 MED ORDER — APIXABAN 5 MG PO TABS: 5.0000 mg | ORAL_TABLET | Freq: Two times a day (BID) | ORAL | Status: DC

## 2023-01-10 MED ORDER — HEPARIN BOLUS VIA INFUSION
3000.0000 [IU] | Freq: Once | INTRAVENOUS | Status: AC
  Administered 2023-01-10: 3000 [IU] via INTRAVENOUS
  Filled 2023-01-10: qty 3000

## 2023-01-10 MED ORDER — AMLODIPINE BESYLATE 10 MG PO TABS
10.0000 mg | ORAL_TABLET | Freq: Every day | ORAL | Status: DC
  Administered 2023-01-11 – 2023-01-16 (×6): 10 mg via ORAL
  Filled 2023-01-10 (×6): qty 1

## 2023-01-10 MED ORDER — ADULT MULTIVITAMIN W/MINERALS CH
1.0000 | ORAL_TABLET | Freq: Every day | ORAL | Status: DC
  Administered 2023-01-11 – 2023-01-16 (×6): 1 via ORAL
  Filled 2023-01-10 (×6): qty 1

## 2023-01-10 MED ORDER — IOHEXOL 350 MG/ML SOLN
75.0000 mL | Freq: Once | INTRAVENOUS | Status: AC | PRN
  Administered 2023-01-10: 75 mL via INTRAVENOUS

## 2023-01-10 MED ORDER — THIAMINE MONONITRATE 100 MG PO TABS
100.0000 mg | ORAL_TABLET | Freq: Every day | ORAL | Status: DC
  Administered 2023-01-11 – 2023-01-16 (×6): 100 mg via ORAL
  Filled 2023-01-10 (×6): qty 1

## 2023-01-10 MED ORDER — POTASSIUM CHLORIDE 10 MEQ/50ML IV SOLN
10.0000 meq | INTRAVENOUS | Status: AC
  Administered 2023-01-10 (×4): 10 meq via INTRAVENOUS
  Filled 2023-01-10 (×4): qty 50

## 2023-01-10 MED ORDER — POTASSIUM CHLORIDE 20 MEQ PO PACK
20.0000 meq | PACK | ORAL | Status: AC
  Administered 2023-01-10 (×2): 20 meq
  Filled 2023-01-10 (×2): qty 1

## 2023-01-10 MED ORDER — ENSURE ENLIVE PO LIQD
237.0000 mL | Freq: Two times a day (BID) | ORAL | Status: DC
  Administered 2023-01-10 – 2023-01-12 (×5): 237 mL via ORAL

## 2023-01-10 MED ORDER — FOLIC ACID 1 MG PO TABS
1.0000 mg | ORAL_TABLET | Freq: Every day | ORAL | Status: DC
  Administered 2023-01-11: 1 mg via ORAL
  Filled 2023-01-10: qty 1

## 2023-01-10 MED ORDER — OSMOLITE 1.5 CAL PO LIQD
1000.0000 mL | ORAL | Status: DC
  Administered 2023-01-10 – 2023-01-11 (×2): 1000 mL
  Filled 2023-01-10 (×2): qty 1000

## 2023-01-10 MED ORDER — APIXABAN 5 MG PO TABS
10.0000 mg | ORAL_TABLET | Freq: Two times a day (BID) | ORAL | Status: DC
  Administered 2023-01-10 – 2023-01-16 (×13): 10 mg via ORAL
  Filled 2023-01-10 (×13): qty 2

## 2023-01-10 NOTE — Progress Notes (Addendum)
PCCM:  Received page from [facility] re: results for CT Chest/A/P completed today, 6/7 at 1820 for occult malignancy in the setting of hypercoagulable state.  Per Dr. Ramiro Harvest at [facility], patient has acute pulmonary embolism with moderate embolic burden, no R heart strain; concern for possible RLL infarct. Echo completed today, 6/7 without evidence of R heart strain.  Confirmed that patient is already on therapeutic anticoagulation (heparin gtt transitioned to Eliquis) for thrombosis of superior sagittal sinus + additional clots.  Tim Lair, PA-C [facility] 01/10/23 7:47 PM  Please see [email or web address] for pager details.  From 7A-7P if no response, please call [phone] After hours, please call ELink [phone]

## 2023-01-10 NOTE — Progress Notes (Signed)
[facility] ADULT ICU REPLACEMENT PROTOCOL   The patient does apply for the [facility] Adult ICU Electrolyte Replacment Protocol based on the criteria listed below:   1.Exclusion criteria: TCTS, ECMO, Dialysis, and Myasthenia Gravis patients 2. Is GFR >/= 30 ml/min? Yes.    Patient's GFR today is >60 3. Is SCr </= 2? Yes.   Patient's SCr is 0.61 mg/dL 4. Did SCr increase >/= 0.5 in 24 hours? No. 5.Pt's weight >40kg  Yes.   6. Abnormal electrolyte(s): K+ 3.2, Mag+ 1.4  7. Electrolytes replaced per protocol 8.  Call MD STAT for K+ </= 2.5, Phos </= 1, or Mag </= 1 Physician:  Dr. Manfred Shirts, Loni Beckwith 01/10/2023 6:50 AM

## 2023-01-10 NOTE — Progress Notes (Signed)
ANTICOAGULATION CONSULT NOTE - follow-up  Pharmacy Consult for Heparin Indication: CVST without hemorrhage  Allergies  Allergen Reactions   Effexor [Venlafaxine Hydrochloride] Other (See Comments)    unknown    Patient Measurements: Height: 5\' 10"  (177.8 cm) Weight: 68.3 kg (150 lb 9.2 oz) IBW/kg (Calculated) : 73 Heparin Dosing Weight: 68 kg  Vital Signs: Temp: 99.2 F (37.3 C) (06/07 0340) Temp Source: Oral (06/07 0340) BP: 118/84 (06/07 0630) Pulse Rate: 75 (06/07 0630)  Labs: Recent Labs    01/07/23 1621 01/08/23 0131 01/08/23 0732 01/08/23 0733 01/09/23 0257 01/09/23 0300 01/09/23 1018 01/09/23 1717 01/10/23 0512 01/10/23 0613  HGB 17.9*   < > 15.9  --   --  14.4  --   --  13.4  --   HCT RESULTS UNAVAILABLE DUE TO INTERFERING SUBSTANCE   < > 42.4  --   --  39.8  --   --  36.8*  --   PLT 135*   < > 115*  --   --  119*  --   --  148*  --   HEPARINUNFRC  --   --   --   --   --  0.15*   < > 0.50 <0.10* 0.16*  CREATININE 1.01   < > 0.75   < > 0.52*  --   --  0.47* 0.61  --   CKTOTAL 675*  --   --   --   --   --   --   --   --   --   TROPONINIHS 14  --  21*  --   --   --   --   --   --   --    < > = values in this interval not displayed.     Estimated Creatinine Clearance: 92.5 mL/min (by C-G formula based on SCr of 0.61 mg/dL).   Medical History: Past Medical History:  Diagnosis Date   Chronic constipation    COPD (chronic obstructive pulmonary disease) (HCC)    Depression    GERD (gastroesophageal reflux disease)    Headache    Hypertension     Medications:  Medications Prior to Admission  Medication Sig Dispense Refill Last Dose   labetalol (NORMODYNE) 300 MG tablet Take 300 mg by mouth 2 (two) times daily.      albuterol (PROVENTIL) (2.5 MG/3ML) 0.083% nebulizer solution Take 2.5 mg by nebulization every 6 (six) hours as needed.        amLODipine (NORVASC) 10 MG tablet Take 10 mg by mouth daily.  0    buPROPion (WELLBUTRIN XL) 150 MG 24 hr tablet  Take 150 mg by mouth daily.      Fluticasone-Umeclidin-Vilant (TRELEGY ELLIPTA) 100-62.5-25 MCG/INH AEPB Inhale 1 puff into the lungs daily. 2 each 0    labetalol (NORMODYNE) 200 MG tablet Take 1 tablet (200 mg total) by mouth 2 (two) times daily. 60 tablet 3    losartan (COZAAR) 100 MG tablet Take 1 tablet (100 mg total) by mouth daily. 30 tablet 3    omeprazole (PRILOSEC) 20 MG capsule       SPIRIVA HANDIHALER 18 MCG inhalation capsule       sucralfate (CARAFATE) 1 g tablet       triamterene-hydrochlorothiazide (MAXZIDE-25) 37.5-25 MG tablet Take 1 tablet by mouth daily. 30 tablet 3    VENTOLIN HFA 108 (90 BASE) MCG/ACT inhaler Inhale 2 puffs into the lungs every 4 (four) hours as needed for shortness of  breath.  3     Assessment: 89 YOM admitted after being found on the floor by ex-wife. Patient has an extensive VTE requiring AC. The patient is not on any known AC therapy PTA. Found to have CVST on MRI 6/5.  Okay per neuro for normal heparin level goal + boluses.  Heparin level 0.16 units/mL (subtherapeutic) on heparin 1650 units/hr. Verified on re-check.  Goal of Therapy:  Heparin level 0.3-0.7 units/ml Monitor platelets by anticoagulation protocol: Yes   Plan:  Give 3000 units IV bolus then increase heparin to 1850 units/hr Check heparin level in 8 hours Daily CBC and heparin level Monitor for signs/symptoms of bleeding  Eldridge Scot, PharmD Clinical Pharmacist 01/10/2023 7:26 AM

## 2023-01-10 NOTE — TOC Benefit Eligibility Note (Signed)
Patient Advocate Encounter  Insurance verification completed.    The patient is currently admitted and upon discharge could be taking Eliquis 5 mg.  The current 30 day co-pay is $47.00.   The patient is insured through [employer]   This test claim was processed through [facility]- copay amounts may vary at other pharmacies due to pharmacy/plan contracts, or as the patient moves through the different stages of their insurance plan.  Shynia Daleo, CPHT Pharmacy Patient Advocate Specialist [facility] Patient Advocate Team Direct Number: [phone]  Fax: [phone]       

## 2023-01-10 NOTE — Progress Notes (Signed)
  Echocardiogram 2D Echocardiogram has been performed.  Isaac Rose 01/10/2023, 12:07 PM

## 2023-01-10 NOTE — Progress Notes (Signed)
Nutrition Follow-up  DOCUMENTATION CODES:   Non-severe (moderate) malnutrition in context of social or environmental circumstances  INTERVENTION:   Transition to nocturnal tube feeds via Cortrak: - Osmolite 1.5 @ 83 ml/hr x 12 hours from 1800 to 0600 (total of 996 ml)  Nocturnal tube feeding regimen provides 1494 kcal, 62 grams of protein, and 759 ml of H2O (meets 75% of minimum kcal needs and 69% of minimum protein needs).  - Liberalize diet to REGULAR to promote PO intake  - Ensure Enlive po BID, each supplement provides 350 kcal and 20 grams of protein  - Continue MVI with minerals, folic acid, thiamine  NUTRITION DIAGNOSIS:   Moderate Malnutrition related to social / environmental circumstances (EtOH abuse) as evidenced by moderate fat depletion, severe muscle depletion.  Ongoing, being addressed via diet advancement, oral nutrition supplements, and nocturnal tube feeds  GOAL:   Patient will meet greater than or equal to 90% of their needs  Progressing  MONITOR:   PO intake, Supplement acceptance, Labs, Weight trends, TF tolerance  REASON FOR ASSESSMENT:   Consult Enteral/tube feeding initiation and management (nocturnal tube feeds)  ASSESSMENT:   62 year old male who presented to the ED on 6/04 with AMS. PMH of HTN, COPD, heavy tobacco abuse, GERD, constipation, HA, depression. Pt admitted with severe hyponatremia, acute metabolic encephalopathy.  06/05 - Cortrak placed (tip distal stomach) 06/07 - diet advanced to Heart Healthy/Carb Modified with thin liquids, transitioned to nocturnal tube feeds  Discussed pt with RN and during ICU rounds. Pt passed swallow screen and was started on a diet. Tube feeds were discontinued. Cortrak was kept in place.  Spoke with pt and sister at bedside. Pt consumed 2 bites of lunch then stated he didn't want anymore. Pt told sister that he is not hungry. He is willing to try chocolate Ensure. Discussed poor PO intake with MD and  RN. MD okay with nocturnal tube feeds to support PO intake.  Admit weight: 67.9 kg Current weight: 68.3 kg  Medications reviewed and include: folic acid, MVI with minerals, thiamine, senna, klor-con 20 mEq x 2, IV magnesium sulfate 4 grams x 1  Labs reviewed: sodium 128, potassium 3.2, chloride 94, magnesium 1.4, platelets 148 CBG's: 94-118 x 24 hours  UOP: 3275 ml x 24 hours I/O's: +2.2 L since admit  Diet Order:   Diet Order             Diet regular Room service appropriate? Yes with Assist; Fluid consistency: Thin  Diet effective now                   EDUCATION NEEDS:   Education needs have been addressed  Skin:  Skin Assessment: Reviewed RN Assessment  Last BM:  01/08/23 type 7  Height:   Ht Readings from Last 1 Encounters:  01/07/23 5\' 10"  (1.778 m)    Weight:   Wt Readings from Last 1 Encounters:  01/10/23 68.3 kg    Ideal Body Weight:  75.5 kg  BMI:  Body mass index is 21.61 kg/m.  Estimated Nutritional Needs:   Kcal:  2000-2200  Protein:  90-110 grams  Fluid:  >2.0 L    Mertie Clause, MS, RD, LDN Inpatient Clinical Dietitian Please see AMiON for contact information.

## 2023-01-10 NOTE — Progress Notes (Signed)
NAME:  Isaac Rose, MRN:  [id number], DOB:  03-25-1961, LOS: 3 ADMISSION DATE:  01/07/2023, CONSULTATION DATE:  01/07/2023 REFERRING MD: Criss Alvine - EDP CHIEF COMPLAINT:  AMS   History of Present Illness:  62 yo male smoker presented to [facility] ER after being found on the floor at home by his ex-wife.  Na 115, and CT head showed dense appearance of the dural venous sinuses, particularly the R transverse/sigmoid sinus (possible artifactual).  Transferred to [facility] for further management.  Pertinent Medical History:  Tobacco abuse- heavy, COPD, GERD, HTN, depression, constipation, headaches  Significant Hospital Events: Including procedures, antibiotic start and stop dates in addition to other pertinent events   6/4 - Presented to [facility] for AMS. Found down by ex-wife at home, altered and with scalp abrasions. CT Head with dense appearance of the dural venous sinuses, particularly the R transverse/sigmoid sinus (possible artifactual); CT C-spine negative. Na 115, K 2.7. 6/5 MRI brain >>  extensive thrombosis of the superior sagittal sinus, right transverse sinus, extending into the upper right internal jugular vein. Lesser, nonocclusive thrombus within the left transverse and sigmoid venous sinuses. Some superficial venous thrombosis over the parietal convexities as well  Interim History / Subjective:   Critically ill-appearing, awake alert interactive He is quite cooperative Wants to go home Objective:  Blood pressure (!) 140/80, pulse 78, temperature 98.9 F (37.2 C), temperature source Axillary, resp. rate (!) 28, height 5\' 10"  (1.778 m), weight 68.3 kg, SpO2 100 %.        Intake/Output Summary (Last 24 hours) at 01/10/2023 0934 Last data filed at 01/10/2023 4098 Gross per 24 hour  Intake 4181.86 ml  Output 2825 ml  Net 1356.86 ml   Filed Weights   01/07/23 2003 01/08/23 0710 01/10/23 0500  Weight: 67.9 kg 68.3 kg 68.3 kg   Physical Examination:  General -middle-aged, chronically  ill-appearing Eyes -pupils reacting ENT -moist oral mucosa Cardiac -S1-S2 appreciated with no murmur Chest -clear breath sounds bilaterally Abdomen -soft, bowel sounds appreciated Extremities - no cyanosis, clubbing, or edema Skin - no rashes Neuro - RASS -2 , does not follow commands,    Multiple electrolyte derangements being repleted including hypomagnesemia, hypokalemia, hypophosphatemia Sodium improved  Resolved Hospital Problem List:    Assessment & Plan:   Acute metabolic encephalopathy from hypovolemic hyponatremia in the setting of presumed EtOH abuse -Acute alcohol withdrawal with delirium -On CIWA -Was on Precedex -Continue thiamine and folic acid, multivitamins  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 -On IV heparin -Neurology consulted -Continue anticoagulation  Electrolyte derangement including hypokalemia, hypomagnesemia, hypophosphatemia -Being repleted  History of chronic obstructive pulmonary disease with tobacco abuse -Smokes 2 packs a day -On Brovana, Yupelri, Pulmicort -As needed albuterol -Smoking cessation counseling  History of hypertension -Continue Norvasc -Normotensive at present  History of GERD -Continue Protonix  He did pass swallowing evaluation will start him on a diet  Best Practice (right click and "Reselect all SmartList Selections" daily)   Diet/type: tubefeeds, start diet DVT prophylaxis: systemic heparin GI prophylaxis: PPI Lines: N/A Foley:  N/A Code Status:  full code Last date of multidisciplinary goals of care discussion [Pending]  Labs       Latest Ref Rng & Units 01/10/2023    5:12 AM 01/09/2023    5:17 PM 01/09/2023    3:00 AM  CMP  Glucose 70 - 99 mg/dL 119  147    BUN 8 - 23 mg/dL 6  8    Creatinine 8.29 -  1.24 mg/dL 1.61  0.96    Sodium 045 - 145 mmol/L 128  128  130   Potassium 3.5 - 5.1 mmol/L 3.2  3.4    Chloride 98 - 111 mmol/L 94  88    CO2 22 - 32 mmol/L 26  26    Calcium 8.9 -  10.3 mg/dL 7.8  7.9         Latest Ref Rng & Units 01/10/2023    5:12 AM 01/09/2023    3:00 AM 01/08/2023    7:32 AM  CBC  WBC 4.0 - 10.5 K/uL 6.6  7.8  7.8   Hemoglobin 13.0 - 17.0 g/dL 40.9  81.1  91.4   Hematocrit 39.0 - 52.0 % 36.8  39.8  42.4   Platelets 150 - 400 K/uL 148  119  115     ABG    Component Value Date/Time   PHART 7.527 (H) 01/08/2023 0228   PCO2ART 31.7 (L) 01/08/2023 0228   PO2ART 70 (L) 01/08/2023 0228   HCO3 26.3 01/08/2023 0228   TCO2 27 01/08/2023 0228   ACIDBASEDEF 1.6 10/09/2010 0900   O2SAT 96 01/08/2023 0228    CBG (last 3)  Recent Labs    01/09/23 2339 01/10/23 0338 01/10/23 0759  GLUCAP 100* 103* 104*   The patient is critically ill with multiple organ systems failure and requires high complexity decision making for assessment and support, frequent evaluation and titration of therapies, application of advanced monitoring technologies and extensive interpretation of multiple databases. Critical Care Time devoted to patient care services described in this note independent of APP/resident time (if applicable)  is 31 minutes.   Virl Diamond MD [facility] Personal pager: See Amion If unanswered, please page CCM On-call: #[phone]

## 2023-01-10 NOTE — Progress Notes (Addendum)
STROKE TEAM PROGRESS NOTE   INTERVAL HISTORY Patient is seen in his room with his sister at the bedside.  He originally presented with altered mental status and hyponatremia after being found down in his home.  MRI was negative for stroke but did show extensive thrombosis of superior sagittal venous sinus and right transverse sinus extending into upper right internal jugular jugular vein with additional thrombus in the left transverse and sigmoid venous sinuses.  Patient has been placed on heparin and will be transitioned to Eliquis later today.  Hypercoagulability labs pending.  MRI was negative for acute stroke.  Vitals:   01/10/23 1000 01/10/23 1100 01/10/23 1122 01/10/23 1200  BP: 114/73 130/85  (!) 160/117  Pulse: 82 85  91  Resp: (!) 22 (!) 32  (!) 32  Temp:   98.9 F (37.2 C)   TempSrc:   Axillary   SpO2: 99% 97%  95%  Weight:      Height:       CBC:  Recent Labs  Lab 01/07/23 1621 01/08/23 0131 01/09/23 0300 01/10/23 0512  WBC 11.7*   < > 7.8 6.6  NEUTROABS 8.7*  --   --   --   HGB 17.9*   < > 14.4 13.4  HCT RESULTS UNAVAILABLE DUE TO INTERFERING SUBSTANCE   < > 39.8 36.8*  MCV RESULTS UNAVAILABLE DUE TO INTERFERING SUBSTANCE   < > 109.6* 110.2*  PLT 135*   < > 119* 148*   < > = values in this interval not displayed.   Basic Metabolic Panel:  Recent Labs  Lab 01/09/23 0300 01/09/23 1717 01/10/23 0512  NA 130* 128* 128*  K  --  3.4* 3.2*  CL  --  88* 94*  CO2  --  26 26  GLUCOSE  --  105* 129*  BUN  --  8 6*  CREATININE  --  0.47* 0.61  CALCIUM  --  7.9* 7.8*  MG 1.5*  --  1.4*  PHOS 2.1*  --  2.9   Lipid Panel:  Recent Labs  Lab 01/10/23 0839  CHOL 124  TRIG 77  HDL 59  CHOLHDL 2.1  VLDL 15  LDLCALC 50   HgbA1c: No results for input(s): "HGBA1C" in the last 168 hours. Urine Drug Screen:  Recent Labs  Lab 01/10/23 0839  LABOPIA NONE DETECTED  COCAINSCRNUR NONE DETECTED  LABBENZ POSITIVE*  AMPHETMU NONE DETECTED  THCU NONE DETECTED  LABBARB  NONE DETECTED    Alcohol Level  Recent Labs  Lab 01/07/23 1621  ETH <10    IMAGING past 24 hours ECHOCARDIOGRAM COMPLETE  Result Date: 01/10/2023    ECHOCARDIOGRAM REPORT   Patient Name:   CADENCE MCLAMB Date of Exam: 01/10/2023 Medical Rec #:  [id number]         Height:       70.0 in Accession #:    [id number]        Weight:       150.6 lb Date of Birth:  01-03-1961         BSA:          1.850 m Patient Age:    61 years          BP:           133/71 mmHg Patient Gender: M                 HR:           81 bpm. Exam  Location:  Inpatient Procedure: 2D Echo, Cardiac Doppler and Color Doppler Indications:    Stroke  History:        Patient has no prior history of Echocardiogram examinations.                 COPD; Risk Factors:Hypertension and Current Smoker.  Sonographer:    Lucy Antigua Referring Phys: [id number] Syris Brookens S Kacyn Souder  Sonographer Comments: Suboptimal parasternal window. Image acquisition challenging due to COPD and Image acquisition challenging due to respiratory motion. IMPRESSIONS  1. Left ventricular ejection fraction, by estimation, is 60 to 65%. The left ventricle has normal function. The left ventricle has no regional wall motion abnormalities. Left ventricular diastolic parameters were normal.  2. Right ventricular systolic function is normal. The right ventricular size is normal.  3. The mitral valve is normal in structure. Trivial mitral valve regurgitation. No evidence of mitral stenosis.  4. The aortic valve is normal in structure. Aortic valve regurgitation is not visualized. No aortic stenosis is present.  5. The inferior vena cava is normal in size with greater than 50% respiratory variability, suggesting right atrial pressure of 3 mmHg. FINDINGS  Left Ventricle: Left ventricular ejection fraction, by estimation, is 60 to 65%. The left ventricle has normal function. The left ventricle has no regional wall motion abnormalities. The left ventricular internal cavity size was normal in size.  There is  no left ventricular hypertrophy. Left ventricular diastolic parameters were normal. Right Ventricle: The right ventricular size is normal. No increase in right ventricular wall thickness. Right ventricular systolic function is normal. Left Atrium: Left atrial size was normal in size. Right Atrium: Right atrial size was normal in size. Pericardium: There is no evidence of pericardial effusion. Mitral Valve: The mitral valve is normal in structure. Trivial mitral valve regurgitation. No evidence of mitral valve stenosis. Tricuspid Valve: The tricuspid valve is normal in structure. Tricuspid valve regurgitation is trivial. No evidence of tricuspid stenosis. Aortic Valve: The aortic valve is normal in structure. Aortic valve regurgitation is not visualized. No aortic stenosis is present. Aortic valve mean gradient measures 6.0 mmHg. Aortic valve peak gradient measures 9.4 mmHg. Aortic valve area, by VTI measures 2.42 cm. Pulmonic Valve: The pulmonic valve was not well visualized. Pulmonic valve regurgitation is not visualized. No evidence of pulmonic stenosis. Aorta: The aortic root is normal in size and structure. Venous: The inferior vena cava is normal in size with greater than 50% respiratory variability, suggesting right atrial pressure of 3 mmHg. IAS/Shunts: No atrial level shunt detected by color flow Doppler.  LEFT VENTRICLE PLAX 2D LVOT diam:     1.90 cm   Diastology LV SV:         69        LV e' medial:    9.46 cm/s LV SV Index:   37        LV E/e' medial:  8.3 LVOT Area:     2.84 cm  LV e' lateral:   12.90 cm/s                          LV E/e' lateral: 6.1  RIGHT VENTRICLE RV S prime:     19.80 cm/s TAPSE (M-mode): 2.2 cm LEFT ATRIUM           Index        RIGHT ATRIUM           Index LA Vol (A4C): 44.5 ml 24.05 ml/m  RA Area:     17.75 cm                                    RA Volume:   49.90 ml  26.97 ml/m  AORTIC VALVE AV Area (Vmax):    2.33 cm AV Area (Vmean):   2.16 cm AV Area (VTI):      2.42 cm AV Vmax:           153.00 cm/s AV Vmean:          111.000 cm/s AV VTI:            0.284 m AV Peak Grad:      9.4 mmHg AV Mean Grad:      6.0 mmHg LVOT Vmax:         126.00 cm/s LVOT Vmean:        84.700 cm/s LVOT VTI:          0.242 m LVOT/AV VTI ratio: 0.85  AORTA Ao Root diam: 3.50 cm Ao Asc diam:  3.60 cm MITRAL VALVE MV Area (PHT): 2.29 cm    SHUNTS MV Decel Time: 331 msec    Systemic VTI:  0.24 m MV E velocity: 78.60 cm/s  Systemic Diam: 1.90 cm MV A velocity: 55.00 cm/s MV E/A ratio:  1.43 Arvilla Meres MD Electronically signed by Arvilla Meres MD Signature Date/Time: 01/10/2023/12:18:09 PM    Final    EEG adult  Result Date: 01/09/2023 Charlsie Quest, MD     01/09/2023  5:12 PM Patient Name: TREYSHAUN HASBUN MRN: [id number] Epilepsy Attending: Charlsie Quest Referring Physician/Provider: Erick Blinks, MD Date: 01/09/2023 Duration: 27.42 mins Patient history: 62yo M with Hx of etoh use, presenting with confusion, vomiting, r sided abrasions, 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 EEG to evaluate for seizure. Level of alertness: Awake, asleep AEDs during EEG study: None Technical aspects: This EEG study was done with scalp electrodes positioned according to the 10-20 International system of electrode placement. Electrical activity was reviewed with band pass filter of 1-70Hz , sensitivity of 7 uV/mm, display speed of 43mm/sec with a 60Hz  notched filter applied as appropriate. EEG data were recorded continuously and digitally stored.  Video monitoring was available and reviewed as appropriate. Description: No posterior dominant rhythm was seen. Sleep was characterized by vertex waves, sleep spindles (12 to 14 Hz), maximal frontocentral region. EEG showed continuous generalized 3 to 6 Hz theta-delta slowing.  Hyperventilation and photic stimulation were not performed.   ABNORMALITY - Continuous slow, generalized IMPRESSION: This study is suggestive  of moderate diffuse encephalopathy, nonspecific etiology. No seizures or epileptiform discharges were seen throughout the recording. If suspicion for interictal activity remains a concern, a prolonged study can be considered. Priyanka Annabelle Harman    PHYSICAL EXAM General: Alert, well-nourished, well-developed middle-aged Caucasian man in no acute distress Respiratory: Regular, unlabored respirations on supplemental O2  NEURO:  Mental Status: AA&Ox2, slow to respond and unable to recall events that brought him to the hospital.  Diminished attention, registration and recall.  Poor insight into his condition. Speech/Language: speech is without dysarthria or aphasia.    Cranial Nerves:  II: PERRL.  III, IV, VI: EOMI. Eyelids elevate symmetrically.  V: Sensation is intact to light touch and symmetrical to face.  VII: Smile is symmetrical.  VIII: hearing intact to voice. IX, X:Phonation is normal.  XII:  tongue is midline without fasciculations. Motor: 5/5 strength to all muscle groups tested but movements slowed Tone: is normal and bulk is normal Sensation- Intact to light touch bilaterally.  Coordination: FTN intact bilaterally.No drift.  Gait- deferred   ASSESSMENT/PLAN Mr. HAVARD RADIGAN is a 62 y.o. male with history of COPD, hypertension, tobacco abuse and GERD who originally presented with altered mental status and hyponatremia after being found down in his home.  MRI was negative for stroke but did show extensive thrombosis of superior sagittal venous sinus and right transverse sinus extending into upper right internal jugular jugular vein with additional thrombus in the left transverse and sigmoid venous sinuses.  Patient has been placed on heparin and will be transitioned to Eliquis later today.  Hypercoagulability labs pending.  MRI was negative for acute stroke.  Cerebral venous sinus thrombosis: Extensive thrombosis of superior sagittal sinus and right transverse sinus extending into  right internal jugular vein as well as nonocclusive thrombus in left transverse and sigmoid sinuses-etiology unprovoked ?  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 Yes; Fluid consistency: Thin   No antithrombotic prior to admission, now on Eliquis (apixaban) daily for 6 to 9 months Therapy recommendations: Pending Disposition: Pending  Hypertension Home meds: Amlodipine 10 mg daily, labetalol 300 mg twice daily, losartan 100 mg daily, Maxide-2537.5-25 1 tablet daily Stable Maintain normotension  Hyperlipidemia Home meds: None LDL 50, goal < 70 High intensity statin not indicated as LDL below goal   Other Stroke Risk Factors Cigarette smoker will advise to stop smoking  Other Active Problems None  Hospital day # 3  Cortney E Ernestina Columbia , MSN, AGACNP-BC [facility] See Amion for schedule and pager information 01/10/2023 1:09 PM  STROKE MD NOTE :  I have personally obtained history,examined this patient, reviewed notes, independently viewed imaging studies, participated in medical decision making and plan of care.ROS completed by me personally and pertinent positives fully documented  I have made any additions or clarifications directly to the above note. Agree with note above.  Patient presented with altered mental status with significant hyponatremia without  focal neurological deficits but MRI shows extensive superior lateral thrombosis extending into trances sinuses right more than left but without any significant hemorrhage or infarct on the brain.  Etiology for this appears unclear at the present time and it appears unprovoked.  Will check hypercoagulable panel labs look for occult malignancy and aggressive hydration and anticoagulate with Eliquis for 6 to 9 months.  Long discussion with the patient and answered questions.  Discussed with Dr. Vassie Loll critical care medicine. This patient is critically ill and at significant risk of neurological worsening, death and care requires constant monitoring of vital signs, hemodynamics,respiratory and cardiac monitoring, extensive review of multiple databases, frequent neurological assessment, discussion with family, other specialists and medical decision making of high complexity.I have made any additions or clarifications directly to the above note.This critical care time does not reflect procedure time, or teaching  time or supervisory time of PA/NP/Med Resident etc but could involve care discussion time.  I spent 35 minutes of neurocritical care time  in the care of  this patient.     Delia Heady, MD Medical Director [facility] Pager: [phone] 01/10/2023 2:52 PM  To contact Stroke Continuity provider, please refer to [email or web address]. After hours, contact General Neurology

## 2023-01-10 NOTE — Progress Notes (Signed)
eLink Physician-Brief Progress Note Patient Name: Isaac Rose DOB: 1960-11-12 MRN: [id number]   Date of Service  01/10/2023  HPI/Events of Note  Notified of critical:  "Acute pulmonary embolism. Moderate embolic burden. No CT evidence of right heart strain."  Appears that radiology contacted the team, refer to PA [facility] note.  Patient has been on therapeutic anticoagulation with heparin drip that is now been transitioned to apixaban.   eICU Interventions  No acute intervention indicated.     Intervention Category Minor Interventions: Clinical assessment - ordering diagnostic tests  Solita Macadam 01/10/2023, 8:04 PM

## 2023-01-11 ENCOUNTER — Inpatient Hospital Stay (HOSPITAL_COMMUNITY): Payer: BC Managed Care – PPO

## 2023-01-11 DIAGNOSIS — G08 Intracranial and intraspinal phlebitis and thrombophlebitis: Secondary | ICD-10-CM

## 2023-01-11 DIAGNOSIS — Z86711 Personal history of pulmonary embolism: Secondary | ICD-10-CM | POA: Diagnosis not present

## 2023-01-11 DIAGNOSIS — E871 Hypo-osmolality and hyponatremia: Secondary | ICD-10-CM | POA: Diagnosis not present

## 2023-01-11 DIAGNOSIS — I2694 Multiple subsegmental pulmonary emboli without acute cor pulmonale: Secondary | ICD-10-CM | POA: Diagnosis not present

## 2023-01-11 LAB — COMPREHENSIVE METABOLIC PANEL
ALT: 14 U/L (ref 0–44)
AST: 25 U/L (ref 15–41)
Albumin: 2.2 g/dL — ABNORMAL LOW (ref 3.5–5.0)
Alkaline Phosphatase: 96 U/L (ref 38–126)
Anion gap: 12 (ref 5–15)
BUN: 9 mg/dL (ref 8–23)
CO2: 25 mmol/L (ref 22–32)
Calcium: 8.6 mg/dL — ABNORMAL LOW (ref 8.9–10.3)
Chloride: 88 mmol/L — ABNORMAL LOW (ref 98–111)
Creatinine, Ser: 0.56 mg/dL — ABNORMAL LOW (ref 0.61–1.24)
GFR, Estimated: >60 mL/min (ref >60)
Glucose, Bld: 119 mg/dL — ABNORMAL HIGH (ref 70–99)
Potassium: 3.6 mmol/L (ref 3.5–5.1)
Sodium: 125 mmol/L — ABNORMAL LOW (ref 135–145)
Total Bilirubin: 1.2 mg/dL (ref 0.3–1.2)
Total Protein: 6.3 g/dL — ABNORMAL LOW (ref 6.5–8.1)

## 2023-01-11 LAB — PROTEIN S ACTIVITY: Protein S Activity: 90 % (ref 63–140)

## 2023-01-11 LAB — FOLATE: Folate: 10.6 ng/mL (ref >5.9)

## 2023-01-11 LAB — CBC
HCT: 41.8 % (ref 39.0–52.0)
Hemoglobin: 15.2 g/dL (ref 13.0–17.0)
MCH: 40.4 pg — ABNORMAL HIGH (ref 26.0–34.0)
MCHC: 36.4 g/dL — ABNORMAL HIGH (ref 30.0–36.0)
MCV: 111.2 fL — ABNORMAL HIGH (ref 80.0–100.0)
Platelets: 201 10*3/uL (ref 150–400)
RBC: 3.76 MIL/uL — ABNORMAL LOW (ref 4.22–5.81)
RDW: 14.9 % (ref 11.5–15.5)
WBC: 8.8 10*3/uL (ref 4.0–10.5)
nRBC: 0 % (ref 0.0–0.2)

## 2023-01-11 LAB — VITAMIN B12: Vitamin B-12: 107 pg/mL — ABNORMAL LOW (ref 180–914)

## 2023-01-11 LAB — CARDIOLIPIN ANTIBODIES, IGG, IGM, IGA
Anticardiolipin IgA: <9 APL U/mL (ref 0–11)
Anticardiolipin IgG: <9 GPL U/mL (ref 0–14)
Anticardiolipin IgM: <9 MPL U/mL (ref 0–12)

## 2023-01-11 LAB — LUPUS ANTICOAGULANT PANEL
DRVVT: 37 s (ref 0.0–47.0)
PTT Lupus Anticoagulant: 36.2 s (ref 0.0–43.5)

## 2023-01-11 LAB — PROTEIN S, TOTAL: Protein S Ag, Total: 83 % (ref 60–150)

## 2023-01-11 LAB — GLUCOSE, CAPILLARY
Glucose-Capillary: 109 mg/dL — ABNORMAL HIGH (ref 70–99)
Glucose-Capillary: 111 mg/dL — ABNORMAL HIGH (ref 70–99)
Glucose-Capillary: 116 mg/dL — ABNORMAL HIGH (ref 70–99)
Glucose-Capillary: 117 mg/dL — ABNORMAL HIGH (ref 70–99)
Glucose-Capillary: 124 mg/dL — ABNORMAL HIGH (ref 70–99)
Glucose-Capillary: 131 mg/dL — ABNORMAL HIGH (ref 70–99)

## 2023-01-11 LAB — HOMOCYSTEINE: Homocysteine: 40.6 umol/L — ABNORMAL HIGH (ref 0.0–17.2)

## 2023-01-11 LAB — MAGNESIUM: Magnesium: 1.6 mg/dL — ABNORMAL LOW (ref 1.7–2.4)

## 2023-01-11 LAB — PHOSPHORUS: Phosphorus: 3.4 mg/dL (ref 2.5–4.6)

## 2023-01-11 LAB — PROTEIN C ACTIVITY: Protein C Activity: 76 % (ref 73–180)

## 2023-01-11 MED ORDER — SODIUM CHLORIDE 0.9 % IV SOLN: INTRAVENOUS | Status: DC | PRN

## 2023-01-11 MED ORDER — IOHEXOL 350 MG/ML SOLN
75.0000 mL | Freq: Once | INTRAVENOUS | Status: AC | PRN
  Administered 2023-01-11: 75 mL via INTRAVENOUS

## 2023-01-11 MED ORDER — POTASSIUM CHLORIDE CRYS ER 20 MEQ PO TBCR
40.0000 meq | EXTENDED_RELEASE_TABLET | Freq: Once | ORAL | Status: AC
  Administered 2023-01-11: 40 meq via ORAL
  Filled 2023-01-11: qty 2

## 2023-01-11 MED ORDER — FAMOTIDINE 20 MG PO TABS
20.0000 mg | ORAL_TABLET | Freq: Every day | ORAL | Status: DC
  Administered 2023-01-11 – 2023-01-16 (×6): 20 mg
  Filled 2023-01-11 (×6): qty 1

## 2023-01-11 MED ORDER — LORAZEPAM 1 MG PO TABS: 1.0000 mg | ORAL_TABLET | ORAL | Status: DC | PRN

## 2023-01-11 MED ORDER — LABETALOL HCL 100 MG PO TABS
100.0000 mg | ORAL_TABLET | Freq: Two times a day (BID) | ORAL | Status: DC
  Administered 2023-01-11 – 2023-01-14 (×8): 100 mg via ORAL
  Filled 2023-01-11 (×9): qty 1

## 2023-01-11 MED ORDER — CYANOCOBALAMIN 1000 MCG/ML IJ SOLN
1000.0000 ug | Freq: Once | INTRAMUSCULAR | Status: AC
  Administered 2023-01-11: 1000 ug via INTRAMUSCULAR
  Filled 2023-01-11 (×2): qty 1

## 2023-01-11 MED ORDER — MAGNESIUM SULFATE 4 GM/100ML IV SOLN
4.0000 g | Freq: Once | INTRAVENOUS | Status: AC
  Administered 2023-01-11: 4 g via INTRAVENOUS
  Filled 2023-01-11: qty 100

## 2023-01-11 MED ORDER — FOLIC ACID 1 MG PO TABS
1.0000 mg | ORAL_TABLET | Freq: Every day | ORAL | Status: DC
  Administered 2023-01-12 – 2023-01-16 (×5): 1 mg via ORAL
  Filled 2023-01-11 (×5): qty 1

## 2023-01-11 MED ORDER — BUPROPION HCL ER (XL) 150 MG PO TB24: 150.0000 mg | ORAL_TABLET | Freq: Every day | ORAL | Status: DC

## 2023-01-11 MED ORDER — VITAMIN B-12 1000 MCG PO TABS
1000.0000 ug | ORAL_TABLET | Freq: Every day | ORAL | Status: DC
  Administered 2023-01-12 – 2023-01-16 (×5): 1000 ug
  Filled 2023-01-11 (×5): qty 1

## 2023-01-11 MED ORDER — ONDANSETRON HCL 4 MG/2ML IJ SOLN
4.0000 mg | Freq: Four times a day (QID) | INTRAMUSCULAR | Status: DC | PRN
  Administered 2023-01-11 – 2023-01-13 (×2): 4 mg via INTRAVENOUS
  Filled 2023-01-11 (×2): qty 2

## 2023-01-11 MED ORDER — POTASSIUM CHLORIDE 20 MEQ PO PACK
40.0000 meq | PACK | Freq: Four times a day (QID) | ORAL | Status: DC
  Administered 2023-01-11: 40 meq
  Filled 2023-01-11: qty 2

## 2023-01-11 NOTE — Progress Notes (Addendum)
STROKE TEAM PROGRESS NOTE   INTERVAL HISTORY Patient is seen in his room with 1 family member at the bedside.  He has been hemodynamically stable overnight, and his neurological exam is improved with patient oriented to person place time and situation today.  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  CT angiogram of the head and neck done today shows occluded right vertebral artery.  Vitamin B12 noted to be 107, supplementation ordered.  Vitals:   01/11/23 1000 01/11/23 1100 01/11/23 1141 01/11/23 1200  BP: (!) 150/98 (!) 152/94  (!) 146/86  Pulse:  87  76  Resp: (!) 23 (!) 22  (!) 23  Temp:   98.5 F (36.9 C)   TempSrc:   Axillary   SpO2:  95%  92%  Weight:      Height:       CBC:  Recent Labs  Lab 01/07/23 1621 01/08/23 0131 01/10/23 0512 01/11/23 0412  WBC 11.7*   < > 6.6 8.8  NEUTROABS 8.7*  --   --   --   HGB 17.9*   < > 13.4 15.2  HCT RESULTS UNAVAILABLE DUE TO INTERFERING SUBSTANCE   < > 36.8* 41.8  MCV RESULTS UNAVAILABLE DUE TO INTERFERING SUBSTANCE   < > 110.2* 111.2*  PLT 135*   < > 148* 201   < > = values in this interval not displayed.    Basic Metabolic Panel:  Recent Labs  Lab 01/10/23 0512 01/11/23 0412 01/11/23 1010  NA 128*  --  125*  K 3.2*  --  3.6  CL 94*  --  88*  CO2 26  --  25  GLUCOSE 129*  --  119*  BUN 6*  --  9  CREATININE 0.61  --  0.56*  CALCIUM 7.8*  --  8.6*  MG 1.4* 1.6*  --   PHOS 2.9 3.4  --     Lipid Panel:  Recent Labs  Lab 01/10/23 0839  CHOL 124  TRIG 77  HDL 59  CHOLHDL 2.1  VLDL 15  LDLCALC 50    HgbA1c:  Recent Labs  Lab 01/10/23 0839  HGBA1C 5.3   Urine Drug Screen:  Recent Labs  Lab 01/10/23 0839  LABOPIA NONE DETECTED  COCAINSCRNUR NONE DETECTED  LABBENZ POSITIVE*  AMPHETMU NONE DETECTED  THCU NONE DETECTED  LABBARB NONE DETECTED     Alcohol Level  Recent Labs  Lab 01/07/23 1621  ETH <10     IMAGING past 24 hours CT CHEST ABDOMEN  PELVIS W CONTRAST  Addendum Date: 01/10/2023   ADDENDUM REPORT: 01/10/2023 20:30 ADDENDUM: These results were called by telephone at the time of interpretation on 01/10/2023 at 7:44 Pm to provider Cloyd Stagers, PA, who verbally acknowledged these results. Electronically Signed   By: Helyn Numbers M.D.   On: 01/10/2023 20:30   Result Date: 01/10/2023 CLINICAL DATA:  Occult malignancy EXAM: CT CHEST, ABDOMEN, AND PELVIS WITH CONTRAST TECHNIQUE: Multidetector CT imaging of the chest, abdomen and pelvis was performed following the standard protocol during bolus administration of intravenous contrast. RADIATION DOSE REDUCTION: This exam was performed according to the departmental dose-optimization program which includes automated exposure control, adjustment of the mA and/or kV according to patient size and/or use of iterative reconstruction technique. CONTRAST:  75mL OMNIPAQUE IOHEXOL 350 MG/ML SOLN COMPARISON:  None Available. FINDINGS: CT CHEST FINDINGS Cardiovascular: Extensive multi-vessel coronary artery calcification. Global cardiac size within normal limits. No pericardial effusion. The  central pulmonary arteries are of normal caliber. There is incidentally noted intraluminal filling defect within the right main pulmonary artery peripherally extending into multiple segmental pulmonary arteries of the right middle and lower lobes in keeping with acute pulmonary embolism. The embolic burden is moderate. No CT evidence of right heart strain. Moderate atherosclerotic calcification within the thoracic aorta proximally. Extensive smooth atheromatous plaque within the descending thoracic and proximal abdominal aorta. Left subclavian central venous catheter tip noted at the superior vena caval confluence. Mediastinum/Nodes: No enlarged mediastinal, hilar, or axillary lymph nodes. Thyroid gland, trachea, and esophagus demonstrate no significant findings. Lungs/Pleura: Moderate emphysema. Peripheral airspace infiltrate  within the posterior right upper lobe and right lower lobe may reflect changes of acute infarct given the associated pulmonary embolism. Acute infection could appear similarly. Small right parapneumonic effusion. Trace left effusion. No pneumothorax. No central obstructing lesion. Musculoskeletal: No acute bone abnormality. No lytic or blastic bone lesion. CT ABDOMEN PELVIS FINDINGS Hepatobiliary: Tiny hypodensities within the left hepatic lobe are too small to accurately characterize. Liver otherwise unremarkable. Gallbladder unremarkable. No intra or extrahepatic biliary ductal dilation. Pancreas: Unremarkable Spleen: Unremarkable Adrenals/Urinary Tract: The adrenal glands are unremarkable. The kidneys are normal in size and position. Probable simple cortical cyst within the lower pole the right kidney for which no follow-up imaging is recommended. 2 mm nonobstructing calculus within the interpolar region of the right kidney. No hydronephrosis. No ureteral calculi. Bladder unremarkable. Stomach/Bowel: Nasoenteric feeding tube tip noted within the pylorus. Stomach, small bowel, and large bowel are otherwise unremarkable. Appendix absent. No free intraperitoneal gas or fluid. Vascular/Lymphatic: Extensive aortoiliac atherosclerotic calcification. No aortic aneurysm. No pathologic adenopathy within the abdomen and pelvis. Reproductive: Prostate is unremarkable. Other: Tiny fat containing umbilical hernia. Musculoskeletal: Degenerative changes are seen within the lumbar spine. No lytic or blastic bone lesion. No acute bone abnormality. IMPRESSION: 1. Acute pulmonary embolism. Moderate embolic burden. No CT evidence of right heart strain. 2. Peripheral airspace infiltrate within the posterior right upper lobe and right lower lobe may reflect changes of acute infarct given the associated pulmonary embolism. Acute infection could appear similarly. Small right parapneumonic effusion. 3. Moderate emphysema. 4. Extensive  multi-vessel coronary artery calcification. 5. Minimal right nonobstructing nephrolithiasis. Aortic Atherosclerosis (ICD10-I70.0) and Emphysema (ICD10-J43.9). Electronically Signed: By: Helyn Numbers M.D. On: 01/10/2023 19:16    PHYSICAL EXAM General: Alert, well-nourished, well-developed middle-aged Caucasian man in no acute distress Respiratory: Regular, unlabored respirations on supplemental O2  NEURO:  Mental Status: AA&Ox3, still slow to respond but improved from yesterday.  Poor insight into his condition.  Able to follow single step commands and has some difficulty with two-step commands Speech/Language: speech is without dysarthria or aphasia.    Cranial Nerves:  II: PERRL.  III, IV, VI: EOMI. Eyelids elevate symmetrically.  V: Sensation is intact to light touch and symmetrical to face.  VII: Smile is symmetrical.  VIII: hearing intact to voice. IX, X:Phonation is normal.  XII: tongue is midline without fasciculations. Motor: 5/5 strength to all muscle groups tested but movements slowed Tone: is normal and bulk is normal Sensation- Intact to light touch bilaterally.  Coordination: FTN intact bilaterally with some past-pointing.No drift.  Gait- deferred   ASSESSMENT/PLAN Mr. MEET DAMIAN is a 62 y.o. male with history of COPD, hypertension, tobacco abuse and GERD who originally presented with altered mental status and hyponatremia after being found down in his home.  MRI was negative for stroke but did show extensive thrombosis of superior sagittal venous sinus  and right transverse sinus extending into upper right internal jugular vein with additional thrombus in the left transverse and sigmoid venous sinuses.  Patient has been placed on heparin and will be transitioned to Eliquis later today.  Hypercoagulability labs pending.  MRI was negative for acute stroke.  Cerebral venous sinus thrombosis: Extensive thrombosis of superior sagittal sinus and right transverse sinus  extending into right internal jugular vein as well as nonocclusive thrombus in left transverse and sigmoid sinuses-etiology unprovoked ?  Dehydration ? 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  Bilateral ICA bulb and siphon atherosclerosis. 2D Echo EF 60 to 65%, normal left atrial size, no atrial level shunt LDL 50 HgbA1c 5.3 UDS positive for benzo Hypercoagulable panel pending VTE prophylaxis -fully anticoagulated with Eliquis No antithrombotic prior to admission, now on Eliquis (apixaban) daily for 6 to 12 months. Therapy recommendations: Pending Disposition: Pending  Pulmonary embolus Patient has right-sided PE on CT chest with contrast Could be from right IJ thrombosis LE venous Doppler age indeterminate deep vein thrombosis  involving the right femoral vein, right popliteal vein, right posterior  tibial veins, and right gastrocnemius veins.  Continue anticoagulation with Eliquis  Hypertension Home meds: Amlodipine 10 mg daily, labetalol 300 mg twice daily, losartan 100 mg daily, Maxide 25-37.5 1 tablet daily Stable On Norvasc Maintain normotension  Lipid management Home meds: None LDL 50, goal < 70 High intensity statin not indicated as LDL below goal  Severe hyponatremia Malnutrition Vitamin B12 deficiency Na 115 on admission, now much improved Family reported not eating well for the last 6 months only drank Gatorade B12 = 107, supplement Likely on nutritional  Tobacco abuse Current smoker Smoking cessation counseling provided Pt is willing to quit  Alcohol  abuse Patient alcohol user, denies alcoholic but family did not agree Alcohol limitation education provided  Other Stroke Risk Factors   Other Active Problems COPD  Hospital day # 4  Cortney E Ernestina Columbia , MSN, AGACNP-BC [facility] See Amion for schedule and pager information 01/11/2023 12:31 PM  ATTENDING NOTE: I reviewed above note and agree with the assessment and plan. Pt was seen and examined.   Family at bedside.  Patient lying in bed, depressed mood, psychomotor slowing, however AOx3.  No focal neurologic deficit.  MRI showed no acute stroke but extensive CVST.  CT chest with contrast showed PE, likely from right IJ thrombosis given no acute DVT on LE venous Doppler.  CT head and neck showed extensive CVST as well as carotid and subclavian artery atherosclerosis, right VA occlusion likely due to hypoplastic.  B12 deficiency, on supplement.  Currently on Eliquis.  Smoking cessation and alcohol limitation education provided.  Hypercoag workup pending.  PT and OT pending.  For detailed assessment and plan, please refer to above/below as I have made changes wherever appropriate.   Marvel Plan, MD PhD Stroke Neurology 01/11/2023 6:36 PM  This patient is critically ill due to extensive cerebral venous sinus thrombosis and at significant risk of neurological worsening, death form worsening thrombosis, PE, heart failure, venous infarct, seizure. This patient's care requires constant monitoring of vital signs, hemodynamics, respiratory and cardiac monitoring, review of multiple databases, neurological  assessment, discussion with family, other specialists and medical decision making of high complexity. I spent 40 minutes of neurocritical care time in the care of this patient. I had long discussion with patient and family at bedside, updated pt current condition, treatment plan and potential prognosis, and answered all the questions.  They expressed understanding and appreciation.       To contact Stroke Continuity provider, please refer to [email or web address]. After hours, contact General Neurology

## 2023-01-11 NOTE — Progress Notes (Signed)
VASCULAR LAB    Bilateral lower extremity venous duplex has been performed.  See CV proc for preliminary results.  Gave verbal report to Luisa Hart, RN  Sherren Kerns, RVT 01/11/2023, 12:36 PM

## 2023-01-11 NOTE — Progress Notes (Signed)
NAME:  Isaac Rose, MRN:  [id number], DOB:  15-Apr-1961, LOS: 4 ADMISSION DATE:  01/07/2023, CONSULTATION DATE:  01/07/2023 REFERRING MD: Criss Alvine - EDP CHIEF COMPLAINT:  AMS   History of Present Illness:  62 yo male smoker presented to [facility] ER after being found on the floor at home by his ex-wife.  Na 115, and CT head showed dense appearance of the dural venous sinuses, particularly the R transverse/sigmoid sinus (possible artifactual).  Transferred to [facility] for further management.  Pertinent Medical History:  Tobacco abuse- heavy, COPD, GERD, HTN, depression, constipation, headaches  Significant Hospital Events: Including procedures, antibiotic start and stop dates in addition to other pertinent events   6/4 - Presented to [facility] for AMS. Found down by ex-wife at home, altered and with scalp abrasions. CT Head with dense appearance of the dural venous sinuses, particularly the R transverse/sigmoid sinus (possible artifactual); CT C-spine negative. Na 115, K 2.7. 6/5 MRI brain >>  extensive thrombosis of the superior sagittal sinus, right transverse sinus, extending into the upper right internal jugular vein. Lesser, nonocclusive thrombus within the left transverse and sigmoid venous sinuses. Some superficial venous thrombosis over the parietal convexities as well 6/7 Critically ill-appearing, awake alert interactive 6/8 CT chest abdomen pelvis obtained to help rule out any possible underlying malignancies positive for acute PE with moderate embolic burden, no CT evidence of right heart strain remains on Eliquis  Interim History / Subjective:  Denies any acute complaints this a.m. continues to remain deconditioned and weak  Objective:  Blood pressure 134/84, pulse 87, temperature 98.6 F (37 C), temperature source Oral, resp. rate (!) 26, height 5\' 10"  (1.778 m), weight 68.3 kg, SpO2 95 %.        Intake/Output Summary (Last 24 hours) at 01/11/2023 1002 Last data filed at 01/11/2023 0900 Gross  per 24 hour  Intake 1476.85 ml  Output 3850 ml  Net -2373.15 ml    Filed Weights   01/07/23 2003 01/08/23 0710 01/10/23 0500  Weight: 67.9 kg 68.3 kg 68.3 kg   Physical Examination: General: Acute on chronic ill-appearing middle-age male lying in bed in no acute distress HEENT: [place]/AT, MM pink/moist, PERRL,  Neuro: Alert and oriented, deconditioned, globally weak CV: s1s2 regular rate and rhythm, no murmur, rubs, or gallops,  PULM: Auscultation bilaterally, no increased work of breathing, no added breath sound GI: soft, bowel sounds active in all 4 quadrants, non-tender, non-distended, tolerating TF Extremities: warm/dry, no edema  Skin: no rashes or lesions  Resolved Hospital Problem List:  Acute metabolic encephalopathy  Assessment & Plan:   Hypovolemic hyponatremia in the setting of EtOH abuse  -Sodium on admission 115 Regular alcohol use -Patient states he utilizes liquor every other day, denies any previous alcohol withdrawal or history of seizures P: Trend Bment CIWA protocol Supplement thiamine, folate, multivitamin Oral hydration  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  Hypercoagulable labs obtained and results pending P: Follow labs Main to consider GI consult for possible upper and lower EGD  Electrolyte derangement including hypokalemia, hypomagnesemia,  hypophosphatemia P: Supplement electrolytes as needed Repeat  chemistry pending  History of chronic obstructive pulmonary disease with tobacco abuse -Smokes 2 packs/day times ~96yrs P: Continue bronchodilators Smoking cessation education Encourage pulmonary hygiene Aspiration precautions Nicotine patch  Essential hypertension -Home medications include Norvasc, labetalol, Cozaar, HCTZ P: Continue home Norvasc Resume home labetalol at reduced dose of 100 3 times daily Continuous telemetry  History of GERD P: Continue PPI  Moderate protein calorie malnutrition P: Continue tube feeds Encourage oral intake  Best Practice (right click and "Reselect all SmartList Selections" daily)   Diet/type: tubefeeds, start diet DVT prophylaxis: systemic heparin GI prophylaxis: PPI Lines: N/A Foley:  N/A Code Status:  full code Last date of multidisciplinary goals of care discussion patient and ex-wife updated at bedside  Cathryn Gallery D. Harris, NP-C [facility] Personal contact information can be found on Amion  If no contact or response made please call [phone] 01/11/2023, 10:04 AM

## 2023-01-12 DIAGNOSIS — E7211 Homocystinuria: Secondary | ICD-10-CM | POA: Diagnosis not present

## 2023-01-12 DIAGNOSIS — G08 Intracranial and intraspinal phlebitis and thrombophlebitis: Secondary | ICD-10-CM | POA: Diagnosis not present

## 2023-01-12 DIAGNOSIS — E538 Deficiency of other specified B group vitamins: Secondary | ICD-10-CM | POA: Diagnosis not present

## 2023-01-12 DIAGNOSIS — F17200 Nicotine dependence, unspecified, uncomplicated: Secondary | ICD-10-CM

## 2023-01-12 DIAGNOSIS — E871 Hypo-osmolality and hyponatremia: Secondary | ICD-10-CM | POA: Diagnosis not present

## 2023-01-12 LAB — CBC
HCT: 39.7 % (ref 39.0–52.0)
Hemoglobin: 14 g/dL (ref 13.0–17.0)
MCH: 40.2 pg — ABNORMAL HIGH (ref 26.0–34.0)
MCHC: 35.3 g/dL (ref 30.0–36.0)
MCV: 114.1 fL — ABNORMAL HIGH (ref 80.0–100.0)
Platelets: 213 10*3/uL (ref 150–400)
RBC: 3.48 MIL/uL — ABNORMAL LOW (ref 4.22–5.81)
RDW: 15.1 % (ref 11.5–15.5)
WBC: 8.1 10*3/uL (ref 4.0–10.5)
nRBC: 0 % (ref 0.0–0.2)

## 2023-01-12 LAB — BASIC METABOLIC PANEL
Anion gap: 10 (ref 5–15)
BUN: 11 mg/dL (ref 8–23)
CO2: 26 mmol/L (ref 22–32)
Calcium: 9.1 mg/dL (ref 8.9–10.3)
Chloride: 90 mmol/L — ABNORMAL LOW (ref 98–111)
Creatinine, Ser: 0.56 mg/dL — ABNORMAL LOW (ref 0.61–1.24)
GFR, Estimated: >60 mL/min (ref >60)
Glucose, Bld: 105 mg/dL — ABNORMAL HIGH (ref 70–99)
Potassium: 4.5 mmol/L (ref 3.5–5.1)
Sodium: 126 mmol/L — ABNORMAL LOW (ref 135–145)

## 2023-01-12 LAB — MAGNESIUM: Magnesium: 1.6 mg/dL — ABNORMAL LOW (ref 1.7–2.4)

## 2023-01-12 LAB — GLUCOSE, CAPILLARY
Glucose-Capillary: 101 mg/dL — ABNORMAL HIGH (ref 70–99)
Glucose-Capillary: 112 mg/dL — ABNORMAL HIGH (ref 70–99)
Glucose-Capillary: 127 mg/dL — ABNORMAL HIGH (ref 70–99)
Glucose-Capillary: 105 mg/dL — ABNORMAL HIGH (ref 70–99)

## 2023-01-12 LAB — BETA-2-GLYCOPROTEIN I ABS, IGG/M/A
Beta-2 Glyco I IgG: <9 GPI IgG units (ref 0–20)
Beta-2-Glycoprotein I IgA: <9 GPI IgA units (ref 0–25)
Beta-2-Glycoprotein I IgM: <9 GPI IgM units (ref 0–32)

## 2023-01-12 LAB — PROTEIN C, TOTAL: Protein C, Total: 72 % (ref 60–150)

## 2023-01-12 LAB — OSMOLALITY: Osmolality: 269 mOsm/kg — ABNORMAL LOW (ref 275–295)

## 2023-01-12 LAB — SODIUM, URINE, RANDOM: Sodium, Ur: 76 mmol/L

## 2023-01-12 LAB — OSMOLALITY, URINE: Osmolality, Ur: 388 mOsm/kg (ref 300–900)

## 2023-01-12 LAB — PHOSPHORUS: Phosphorus: 5.8 mg/dL — ABNORMAL HIGH (ref 2.5–4.6)

## 2023-01-12 MED ORDER — SODIUM CHLORIDE 0.9% FLUSH
10.0000 mL | INTRAVENOUS | Status: DC | PRN
  Administered 2023-01-16: 10 mL

## 2023-01-12 MED ORDER — LOSARTAN POTASSIUM 50 MG PO TABS
25.0000 mg | ORAL_TABLET | Freq: Every day | ORAL | Status: DC
  Administered 2023-01-12 – 2023-01-14 (×3): 25 mg via ORAL
  Filled 2023-01-12 (×4): qty 1

## 2023-01-12 MED ORDER — SODIUM CHLORIDE 0.9% FLUSH
10.0000 mL | Freq: Two times a day (BID) | INTRAVENOUS | Status: DC
  Administered 2023-01-12: 10 mL
  Administered 2023-01-13: 15 mL
  Administered 2023-01-15: 10 mL
  Administered 2023-01-15: 15 mL
  Administered 2023-01-16: 10 mL

## 2023-01-12 MED ORDER — VITAMIN B-6 25 MG PO TABS
50.0000 mg | ORAL_TABLET | Freq: Every day | ORAL | Status: DC
  Administered 2023-01-12 – 2023-01-16 (×5): 50 mg via ORAL
  Filled 2023-01-12 (×6): qty 2

## 2023-01-12 MED ORDER — MAGNESIUM SULFATE 2 GM/50ML IV SOLN
2.0000 g | Freq: Once | INTRAVENOUS | Status: AC
  Administered 2023-01-12: 2 g via INTRAVENOUS
  Filled 2023-01-12: qty 50

## 2023-01-12 MED ORDER — ENSURE ENLIVE PO LIQD
237.0000 mL | Freq: Three times a day (TID) | ORAL | Status: DC
  Administered 2023-01-12 – 2023-01-16 (×5): 237 mL via ORAL

## 2023-01-12 NOTE — Progress Notes (Signed)
Cortrak dc'ed and removed per Dr. Maryfrances Bunnell

## 2023-01-12 NOTE — Evaluation (Signed)
Physical Therapy Evaluation Patient Details Name: Isaac Rose MRN: [id number] DOB: 11-22-1960 Today's Date: 01/12/2023  History of Present Illness  62 year old male admitted with  with acute encephalopathy and muttering confusion with mild agitation. 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 Lesser, nonocclusive thrombus within the left transverse and  sigmoid venous sinuses. Some superficial venous thrombosis over the  parietal convexities as well; heparin started 6/5;  No evidence of acute stroke; 6/7 Acute pulmonary embolism. Moderate embolic burden. No CT evidence  of right heart strain;  has a past medical history of Chronic constipation, COPD (chronic obstructive pulmonary disease) (HCC), Depression, GERD (gastroesophageal reflux disease), Headache, and Hypertension.  Clinical Impression   Pt admitted with above diagnosis. Lives at home alone, in a single-level home with unknown number of steps to enter; Prior to admission, pt was able to manage independently per patient report; Presents to PT with generalized weakness, incr fall risk with unsteadiness in standing;  needed min assist to get up to EOB, min assist to stand, mod assist to take pivot steps bed to recliner; Will need to know more about available assist at home, if any is available to him; Recommend post-acute rehab to maximize independence and safety with mobility and ADLs in prep for getting back to home and independence ;Pt currently with functional limitations due to the deficits listed below (see PT Problem List). Pt will benefit from skilled PT to increase their independence and safety with mobility to allow discharge to the venue listed below.          Recommendations for follow up therapy are one component of a multi-disciplinary discharge planning process, led by the attending physician.  Recommendations may be  updated based on patient status, additional functional criteria and insurance authorization.  Follow Up Recommendations Can patient physically be transported by private vehicle:  (hopefully soon)     Assistance Recommended at Discharge Frequent or constant Supervision/Assistance  Patient can return home with the following  A lot of help with walking and/or transfers;Assistance with cooking/housework    Equipment Recommendations Rolling walker (2 wheels);BSC/3in1  Recommendations for Other Services  OT consult (ordered per protocol)    Functional Status Assessment Patient has had a recent decline in their functional status and demonstrates the ability to make significant improvements in function in a reasonable and predictable amount of time.     Precautions / Restrictions Precautions Precautions: Fall Restrictions Weight Bearing Restrictions: No      Mobility  Bed Mobility Overal bed mobility: Needs Assistance Bed Mobility: Supine to Sit     Supine to sit: Min assist     General bed mobility comments: Min handheld assist to pull to sit    Transfers Overall transfer level: Needs assistance Equipment used: Rolling walker (2 wheels) Transfers: Sit to/from Stand, Bed to chair/wheelchair/BSC Sit to Stand: Min assist   Step pivot transfers: Mod assist       General transfer comment: Min assist to power up and steady; Mod assist for balance with pivot steps bed to chair    Ambulation/Gait                  Stairs            Wheelchair Mobility    Modified Rankin (Stroke Patients Only)       Balance Overall balance assessment: Needs assistance   Sitting balance-Leahy  Scale: Fair       Standing balance-Leahy Scale: Poor Standing balance comment: dependent on UE support                             Pertinent Vitals/Pain Pain Assessment Pain Assessment: No/denies pain    Home Living Family/patient expects to be discharged to::  Private residence Living Arrangements: Alone Available Help at Discharge: Other (Comment) (will need mroe info) Type of Home: House Home Access: Stairs to enter   Entrance Stairs-Number of Steps:  (Need to verify)   Home Layout: One level   Additional Comments: Unclear historian; will need more information re: home setup and any available family assist    Prior Function Prior Level of Function : Independent/Modified Independent                     Hand Dominance        Extremity/Trunk Assessment   Upper Extremity Assessment Upper Extremity Assessment: Defer to OT evaluation    Lower Extremity Assessment Lower Extremity Assessment: Generalized weakness    Cervical / Trunk Assessment Cervical / Trunk Assessment: Normal  Communication   Communication: No difficulties  Cognition Arousal/Alertness: Awake/alert Behavior During Therapy: WFL for tasks assessed/performed Overall Cognitive Status: No family/caregiver present to determine baseline cognitive functioning                                          General Comments General comments (skin integrity, edema, etc.): Session conducted on Room Air and lowest O2 sat observed was 92%    Exercises     Assessment/Plan    PT Assessment Patient needs continued PT services  PT Problem List Decreased strength;Decreased activity tolerance;Decreased balance;Decreased mobility;Decreased coordination;Decreased cognition;Decreased knowledge of use of DME;Decreased safety awareness;Decreased knowledge of precautions       PT Treatment Interventions DME instruction;Gait training;Stair training;Functional mobility training;Therapeutic activities;Therapeutic exercise;Balance training;Neuromuscular re-education;Cognitive remediation;Patient/family education    PT Goals (Current goals can be found in the Care Plan section)  Acute Rehab PT Goals Patient Stated Goal: Did not state; agreeable to OOB PT Goal  Formulation: Patient unable to participate in goal setting Time For Goal Achievement: 01/26/23 Potential to Achieve Goals: Good    Frequency Min 4X/week     Co-evaluation               AM-PAC PT "6 Clicks" Mobility  Outcome Measure Help needed turning from your back to your side while in a flat bed without using bedrails?: None Help needed moving from lying on your back to sitting on the side of a flat bed without using bedrails?: A Little Help needed moving to and from a bed to a chair (including a wheelchair)?: A Lot Help needed standing up from a chair using your arms (e.g., wheelchair or bedside chair)?: A Little Help needed to walk in hospital room?: A Lot Help needed climbing 3-5 steps with a railing? : Total 6 Click Score: 15    End of Session Equipment Utilized During Treatment: Gait belt Activity Tolerance: Patient tolerated treatment well Patient left: in chair;with call bell/phone within reach;with chair alarm set Nurse Communication: Mobility status PT Visit Diagnosis: Unsteadiness on feet (R26.81);Other abnormalities of gait and mobility (R26.89)    Time: 1610-9604 PT Time Calculation (min) (ACUTE ONLY): 12 min   Charges:   PT Evaluation $  PT Eval Moderate Complexity: 1 Mod          Van Clines, PT  Acute Rehabilitation Services Office [phone] Secure Chat welcomed   Levi Aland 01/12/2023, 4:14 PM

## 2023-01-12 NOTE — Progress Notes (Signed)
Progress Note   Patient: Isaac Rose FAO:[id number] DOB: Jan 01, 1961 DOA: 01/07/2023     5 DOS: the patient was seen and examined on 01/12/2023 at 9:39AM      Brief hospital course: 62 y.o. M with hx HTN, smoking, COPD who presented with being found on the floor by wife, confused.  Na 115 and also dural venous sinus thrombosis and PE.    6/4: Admitted at [facility] for hyponatremia, confusion --> transferred to [facility] for 3% saline 6/5: MRI brain shows venous sinus thrombosis, heparin started 6/8: CT C/A/P obtained, shows incidental PE, no malignancy 6/9: Transferred OOU        Assessment and plan: Cerebral venous sinus thrombosis  Extensive thrombosis of superior sagittal sinus and right transverse sinus extending into right internal jugular vein as well as nonocclusive thrombus in left transverse and sigmoid sinuses.  Etiology of this is unclear.    - Continue apixaban - Follow up hypercoagulable panel with Hematology, referral sent    Severe hyponatremia Na 115 on admission, Uosm elevated and Una elevated, c/w SIADH.  TSH, cortisol normal.    Treated with hypertonic saline in the ICU initially.  Improved to 130, but now trended back down to 125 mmol/L, stable today at 126 - Check urine chemistries again - Start fluid restriction - Hold home HCTZ - Fluid restriction at odds with need for increased calorie intake   Pulmonary embolus Right leg indeterminate age DVT Incidental findings - See above   Weight loss CT Chest, abdomen and pelvis here without obvious malignancy or metastases. - Follow up with PCP for GI referral after discharge - Follow up with PCP for PSA  - Since following up with hematology/oncology, would recommend correlating malignancy work up with them   Hypertension BP mostly controlled -Continue labetalol, amlodipine -Resume losartan - Hold home triamterene and HCTZ     Malnutrition, moderate As evidenced by 40 lb weight loss in 3 months,  moderate loss of subcutaneous muscle mass and fat.  Swallowing is okay.  No plan for PEG tube at this time. - Consult dietitian for supplement recommendations at d/c - Continue Ensure, increase dose   Vitamin B12 deficiency - Continue vitamin B12 supplement at d/c    Tobacco abuse Cessation recommended    Acute metabolic encephalopathy At baseline, no cognitive impairment, but admitted with disorientation and confusion, now resolved.  Due to hyponatremia.   Alcohol abuse   Hypomagnesemia - Supplement magnesium        Subjective: Feeling tired, no appetite.  No fever, no cough, no dyspnea.       Physical Exam: BP (!) 142/88 (BP Location: Left Arm)   Pulse 73   Temp 97.8 F (36.6 C) (Oral)   Resp 16   Ht 5\' 10"  (1.778 m)   Wt 64.9 kg   SpO2 97%   BMI 20.53 kg/m   Thin adult male, lying in bed, appears weak and tired RRR, no murmurs, no peripheral edema Respiratory normal, lungs clear without rales or wheezes Abdomen soft without tenderness palpation or guarding Diffuse loss of subcutaneous muscle mass and fat Attention normal, psychomotor slowing mild, face symmetric, speech fluent    Data Reviewed: Discussed with neurology Magnesium level CBC normal Sodium 125 up to 126 Cr normal Osmolality low         Disposition: Status is: Inpatient Paitent admitted with encephalopathy and hyponatremia.  Found to have venous sinus thrombosis, PE and DVT.  Started on apixaban  Take out Cortrak today.  If Na improves to high 120s or low 130s, and asymptomatic, likely can go home tomorrow        Author: Alberteen Sam, MD 01/12/2023 2:07 PM  For on call review [email or web address].

## 2023-01-12 NOTE — Plan of Care (Signed)

## 2023-01-12 NOTE — Progress Notes (Signed)
STROKE TEAM PROGRESS NOTE   INTERVAL HISTORY PT is at the bedside. He walked with PT in the room and hallway, some unsteadiness but overall looks good. He is sitting in chair, still has cortrak on nocturnal tube feeding. Encourage him to have more po intake.   Vitals:   01/12/23 0352 01/12/23 0416 01/12/23 0740 01/12/23 0740  BP:  (!) 153/86 (!) 142/88 (!) 142/88  Pulse:  75 74 73  Resp:  16    Temp:   97.8 F (36.6 C) 97.8 F (36.6 C)  TempSrc:   Oral Oral  SpO2:  98% 97% 97%  Weight: 64.9 kg     Height:       CBC:  Recent Labs  Lab 01/07/23 1621 01/08/23 0131 01/11/23 0412 01/12/23 0154  WBC 11.7*   < > 8.8 8.1  NEUTROABS 8.7*  --   --   --   HGB 17.9*   < > 15.2 14.0  HCT RESULTS UNAVAILABLE DUE TO INTERFERING SUBSTANCE   < > 41.8 39.7  MCV RESULTS UNAVAILABLE DUE TO INTERFERING SUBSTANCE   < > 111.2* 114.1*  PLT 135*   < > 201 213   < > = values in this interval not displayed.   Basic Metabolic Panel:  Recent Labs  Lab 01/11/23 0412 01/11/23 1010 01/12/23 0154 01/12/23 0849  NA  --  125*  --  126*  K  --  3.6  --  4.5  CL  --  88*  --  90*  CO2  --  25  --  26  GLUCOSE  --  119*  --  105*  BUN  --  9  --  11  CREATININE  --  0.56*  --  0.56*  CALCIUM  --  8.6*  --  9.1  MG 1.6*  --  1.6*  --   PHOS 3.4  --  5.8*  --    Lipid Panel:  Recent Labs  Lab 01/10/23 0839  CHOL 124  TRIG 77  HDL 59  CHOLHDL 2.1  VLDL 15  LDLCALC 50   HgbA1c:  Recent Labs  Lab 01/10/23 0839  HGBA1C 5.3   Urine Drug Screen:  Recent Labs  Lab 01/10/23 0839  LABOPIA NONE DETECTED  COCAINSCRNUR NONE DETECTED  LABBENZ POSITIVE*  AMPHETMU NONE DETECTED  THCU NONE DETECTED  LABBARB NONE DETECTED    Alcohol Level  Recent Labs  Lab 01/07/23 1621  ETH <10    IMAGING past 24 hours VAS [place] LOWER EXTREMITY VENOUS (DVT)  Result Date: 01/11/2023  Lower Venous DVT Study Patient Name:  Isaac Rose  Date of Exam:   01/11/2023 Medical Rec #: [id number]           Accession #:    [id number] Date of Birth: [birth year]          Patient Gender: M Patient Age:   9 years Exam Location:  [facility] Procedure:      VAS [place] LOWER EXTREMITY VENOUS (DVT) Referring Phys: Scheryl Marten Vern Guerette --------------------------------------------------------------------------------  Other Indications: Dural venous sinus thrombosis, right IJV thrombus. Risk Factors: Confirmed PE ETOH withdrawal. Comparison Study: No prior study on file Performing Technologist: Sherren Kerns RVS  Examination Guidelines: A complete evaluation includes B-mode imaging, spectral Doppler, color Doppler, and power Doppler as needed of all accessible portions of each vessel. Bilateral testing is considered an integral part of a complete examination. Limited examinations for reoccurring indications may be performed as noted. The reflux portion of  the exam is performed with the patient in reverse Trendelenburg.  +---------+---------------+---------+-----------+----------+-------------------+ RIGHT    CompressibilityPhasicitySpontaneityPropertiesThrombus Aging      +---------+---------------+---------+-----------+----------+-------------------+ CFV      Full           Yes      No                                       +---------+---------------+---------+-----------+----------+-------------------+ SFJ      Full                                                             +---------+---------------+---------+-----------+----------+-------------------+ FV Prox  Full           No       No                                       +---------+---------------+---------+-----------+----------+-------------------+ FV Mid   Full           No       No                                       +---------+---------------+---------+-----------+----------+-------------------+ FV DistalNone           No       No                   Age Indeterminate    +---------+---------------+---------+-----------+----------+-------------------+ PFV      Full           Yes      Yes                                      +---------+---------------+---------+-----------+----------+-------------------+ POP      None           No       No                   Age Indeterminate   +---------+---------------+---------+-----------+----------+-------------------+ PTV      None           No       No                   Age Indeterminate   +---------+---------------+---------+-----------+----------+-------------------+ PERO                                                  Not well visualized +---------+---------------+---------+-----------+----------+-------------------+ Gastroc  None           No       No                   Age Indeterminate   +---------+---------------+---------+-----------+----------+-------------------+   +---------+---------------+---------+-----------+----------+--------------+ LEFT     CompressibilityPhasicitySpontaneityPropertiesThrombus Aging +---------+---------------+---------+-----------+----------+--------------+ CFV      Full  Yes      Yes                                 +---------+---------------+---------+-----------+----------+--------------+ SFJ      Full                                                        +---------+---------------+---------+-----------+----------+--------------+ FV Prox  Full           Yes      Yes                                 +---------+---------------+---------+-----------+----------+--------------+ FV Mid   Full                                                        +---------+---------------+---------+-----------+----------+--------------+ FV DistalFull                                                        +---------+---------------+---------+-----------+----------+--------------+ PFV      Full           Yes      Yes                                  +---------+---------------+---------+-----------+----------+--------------+ POP      Full           Yes      Yes                                 +---------+---------------+---------+-----------+----------+--------------+ PTV      Full                                                        +---------+---------------+---------+-----------+----------+--------------+ PERO     Full                                                        +---------+---------------+---------+-----------+----------+--------------+     Summary: RIGHT: - Findings consistent with age indeterminate deep vein thrombosis involving the right femoral vein, right popliteal vein, right posterior tibial veins, and right gastrocnemius veins. - No cystic structure found in the popliteal fossa.  LEFT: - No evidence of deep vein thrombosis in the lower extremity. No indirect evidence of obstruction proximal to the inguinal ligament. - No cystic structure found in the popliteal fossa.  *See table(s) above for measurements and observations.    Preliminary  PHYSICAL EXAM General: Alert, well-nourished, well-developed middle-aged Caucasian man in no acute distress Respiratory: Regular, unlabored respirations on supplemental O2  NEURO:  Mental Status: AA&Ox3, still depressed mood with slow to respond but improved from yesterday.  Poor insight into his condition.  Able to follow single step commands and has some difficulty with two-step commands Speech/Language: speech is without dysarthria or aphasia.    Cranial Nerves:  II: PERRL.  III, IV, VI: EOMI. Eyelids elevate symmetrically.  V: Sensation is intact to light touch and symmetrical to face.  VII: Smile is symmetrical.  VIII: hearing intact to voice. IX, X:Phonation is normal.  XII: tongue is midline without fasciculations. Motor: 5/5 strength to all muscle groups tested but movements slowed Tone: is normal and bulk is normal Sensation- Intact to light touch  bilaterally.  Coordination: FTN intact bilaterally with some past-pointing.No drift.  Gait- deferred   ASSESSMENT/PLAN Isaac Rose is a 62 y.o. male with history of COPD, hypertension, tobacco abuse and GERD who originally presented with altered mental status and hyponatremia after being found down in his home.  MRI was negative for stroke but did show extensive thrombosis of superior sagittal venous sinus and right transverse sinus extending into upper right internal jugular vein with additional thrombus in the left transverse and sigmoid venous sinuses.  Patient has been placed on heparin and will be transitioned to Eliquis later today.  Hypercoagulability labs pending.  MRI was negative for acute stroke.  Cerebral venous sinus thrombosis: Extensive thrombosis of superior sagittal sinus and right transverse sinus extending into right internal jugular vein as well as nonocclusive thrombus in left transverse and sigmoid sinuses - etiology unclear, ? unprovoked ?  Dehydration ? Malnutrition ? 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  Bilateral ICA bulb and siphon atherosclerosis. 2D Echo EF 60 to 65%, normal left atrial size, no atrial level shunt LDL 50 HgbA1c 5.3 UDS positive for benzo Hypercoagulable panel pending but mild to moderately high homocysteine  VTE prophylaxis -fully anticoagulated with Eliquis No antithrombotic prior to admission, now on Eliquis (apixaban) daily for 6 to 12 months. Therapy recommendations: Pending Disposition: Pending  Pulmonary  embolus Patient has right-sided PE on CT chest with contrast Could be from right IJ thrombosis LE venous Doppler age indeterminate deep vein thrombosis  involving the right femoral vein, right popliteal vein, right posterior  tibial veins, and right gastrocnemius veins.  Continue anticoagulation with Eliquis  Hypercysteinemia  Homocysteine 40.6, mild to moderate high Likely due to smoking and low B12 level On B12 and FA supplement Add Vit B6 50 Follow up with PCP and recheck in 1-2 months  Hypertension Home meds: Amlodipine 10 mg daily, labetalol 300 mg twice daily, losartan 100 mg daily, Maxide 25-37.5 1 tablet daily Stable On Norvasc Maintain normotension  Lipid management Home meds: None LDL 50, goal < 70 High intensity statin not indicated as LDL below goal  Severe hyponatremia Malnutrition Vitamin B12 deficiency Na 115 on admission, now much improved Family reported not eating well for the last 6 months only drank Gatorade B12 = 107, supplement Likely nutritional  Tobacco abuse Current smoker Smoking cessation counseling provided Pt is willing  to quit  Alcohol abuse Patient alcohol user, denies alcoholic but family did not agree Alcohol limitation education provided  Other Stroke Risk Factors   Other Active Problems COPD  Hospital day # 5  Neurology will sign off. Please call with questions. Pt will follow up with stroke clinic NP at [facility] in about 4 weeks. Thanks for the consult.   Marvel Plan, MD PhD Stroke Neurology 01/12/2023 11:42 AM       To contact Stroke Continuity provider, please refer to [email or web address]. After hours, contact General Neurology

## 2023-01-12 NOTE — Hospital Course (Signed)
The patient is a 62 y.o. M with hx HTN, smoking, COPD who presented to the hospital after being found on the floor by wife being confused.  On admission Na+ was 115 and was found to have a dural venous sinus thrombosis and PE.    6/4: Admitted at [facility] for hyponatremia, confusion --> transferred to [facility] for 3% saline 6/5: MRI brain shows venous sinus thrombosis, heparin started 6/8: CT C/A/P obtained, shows incidental PE, no malignancy 6/9: Transferred [facility] 6/10 patient sodium level continues to be stable at 126. 6/11 patient had no nausea and vomiting yesterday, diet downgraded to clear liquid diet, abdominal x-ray does not show any signs of SBO.  Level was 127  **6/12 AM remains the same and patient is nausea vomiting is improved and he had a bowel movement.  Requesting PPI.  Will advance diet to see if he tolerates and anticipate discharge in next 24 to 48 hours if sodium improves a little bit closer to 130  Diet was advanced and sodium improved significantly.  Patient is medically stable for discharge and will need to go home with home health therapy.  Will need to see PCP, GI, neurology as well as hematology/oncology for further workup and evaluation   Assessment and plan:  Cerebral Venous Sinus Thrombosis  -Extensive thrombosis of superior sagittal sinus and right transverse sinus extending into right internal jugular vein as well as nonocclusive thrombus in left transverse and sigmoid sinuses.   -Follow-up with neurology in outpatient setting -Etiology of this is unclear.    -CTA Head and Neck done 6/8 and showed "Unchanged appearance of extensive dural venous sinus thrombosis within the limits of arterial timed exam. 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 No intracranial large vessel occlusion or significant stenosis.Friable soft plaque in the right subclavian artery. Aortic Atherosclerosis (ICD10-I70.0) and  Emphysema" -Continue Anticoagulation with Apixaban -Follow up hypercoagulable panel with Hematology, referral sent and will need to follow-up within 1 to 2 weeks    Severe Hyponatremia -Na 115 on admission, Uosm elevated and Una elevated, c/w SIADH.   -TSH, cortisol normal.   -Treated with hypertonic saline in the ICU initially.  -Improved to 130, but now trended back down to 125 mmol/L, stable today at 126 -Check urine chemistries again -Start fluid restriction -Continue to Hold home HCTZ -Fluid restriction at odds with need for increased calorie intake -Urine sodium is 72 and urine osmolality is 388. -Looks like picture of SIADH will treat with salt tabs for 2 days starting 6/10, however patient had nausea and vomiting not sure if the patient was able to retain the Salt Tab -Na+ Trend: Recent Labs  Lab 01/10/23 0512 01/11/23 1010 01/12/23 0849 01/13/23 0523 01/14/23 0445 01/15/23 1024 01/16/23 1151  NA 128* 125* 126* 126* 127* 127* 131*  -Recommend regular diet and discharge and follow-up with PCP for repeat sodium check within 1 week  Hypomagnesemia -Patient's Mag Level Trend: Recent Labs  Lab 01/10/23 0512 01/11/23 0412 01/12/23 0154 01/13/23 0523 01/14/23 0445 01/15/23 1024 01/16/23 1151  MG 1.4* 1.6* 1.6* 1.6* 1.8 1.7 1.8  -Replete with IV Mag Sulfate 2 grams prior to discharge -Continue to Monitor and Replete as Necessary -Repeat Mag in the AM   Nausea/Vomiting, improved -Abdominal x-ray is negative for any SBO with normal bowel gas pattern -The patient however has been constipated and has had bowel movement only several days ago. -Downgraded the diet to clear liquid diet and  ordered Dulcolax suppository but had a bowel movement  -Going back to a Soft Diet today with PPI BID and tolerated this well and continue at discharge  Macrocytic Anemia -Hgb/Hct Trend: Recent Labs  Lab 01/10/23 0512 01/11/23 0412 01/12/23 0154 01/13/23 0523 01/14/23 0445  01/15/23 0315 01/16/23 1151  HGB 13.4 15.2 14.0 13.8 13.1 12.8* 12.9*  HCT 36.8* 41.8 39.7 39.1 37.7* 36.4* 37.0*  MCV 110.2* 111.2* 114.1* 113.0* 112.5* 113.4* 113.8*  -Check Anemia Panel in the AM -Continue to Monitor for S/Sx of Bleeding; No overt bleeding noted -Repeat CBC within 1 week   Pulmonary Embolus Right Leg Indeterminate age DVT -Incidental findings -See above -C/w Anticoagulation and is now on apixaban   Weight loss -CT Chest, abdomen and pelvis here without obvious malignancy or metastases -CT did show "Acute pulmonary embolism. Moderate embolic burden. No CT evidence of right heart strain. Peripheral airspace infiltrate within the posterior right upper lobe and right lower lobe may reflect changes of acute infarct given the associated pulmonary embolism. Acute infection could appear similarly. Small right parapneumonic effusion. Moderate emphysema.  Extensive multi-vessel coronary artery calcification. Minimal right nonobstructing nephrolithiasis. Aortic Atherosclerosis (ICD10-I70.0) and Emphysema" -Follow up with PCP for GI referral after discharge -Follow up with PCP for PSA  -Since following up with hematology/oncology, would recommend correlating malignancy work up with them -Nutritionist consult as below -Follow-up with GI and referral has been made and will need outpatient EGD and colonoscopy as well as following up with hematology/oncology for possible malignancy workup and hypercoagulable workup   Hypertension -BP mostly controlled -Continue Amodipine 10 mg po Daily and Losartan 50 mg po Daily -Continue to Hold home Triamterene and HCTZ and discontinue on discharge -Continue to Monitor BP per Protocol -Last BP reading was stable at 134/86   Malnutrition, moderate -As evidenced by 40 lb weight loss in 3 months, moderate loss of subcutaneous muscle mass and fat.  -Swallowing is okay.  No plan for PEG tube at this time. -Consult dietitian for supplement  recommendations  Nutrition Status: Nutrition Problem: Moderate Malnutrition Etiology: social / environmental circumstances (EtOH abuse) Signs/Symptoms: moderate fat depletion, severe muscle depletion Interventions: Ensure Enlive (each supplement provides 350kcal and 20 grams of protein), MVI, Refer to RD note for recommendations, Education -Continue Ensure, increase dose and follow-up outpatient   Vitamin B12 deficiency -Continue Cyanocobalamin 1000 mcg po Daily supplement at d/c    Tobacco Abuse -Cessation recommended    Acute Metabolic Encephalopathy -At baseline, no cognitive impairment, but admitted with disorientation and confusion, now resolved.   -Hyponatremia likely contributed is much improved -Continue to Hold Home Triamterene and HCTZ and discontinuing  GERD with Indigestion/GI Prophylaxis -Start PPI with Pantoprazole 40 mg po BID and add Maalox while hospitalized and continue home PPI and famotidine at discharge -C/w Famotidine 20 mg po Daily   Alcohol Abuse -Continue to Monitor for Signs of Withdrawal   Hypoalbuminemia -Patient's Albumin Trend: Recent Labs  Lab 01/07/23 1621 01/08/23 0732 01/11/23 1010 01/13/23 0523 01/14/23 0445 01/15/23 1024 01/16/23 1151  ALBUMIN 2.5* 2.1* 2.2* 2.2* 2.2* 2.4* 2.4*  -Continue to Monitor and Trend and repeat CMP in the AM

## 2023-01-13 ENCOUNTER — Inpatient Hospital Stay (HOSPITAL_COMMUNITY): Payer: BC Managed Care – PPO

## 2023-01-13 DIAGNOSIS — E871 Hypo-osmolality and hyponatremia: Secondary | ICD-10-CM | POA: Diagnosis not present

## 2023-01-13 LAB — CBC
HCT: 39.1 % (ref 39.0–52.0)
Hemoglobin: 13.8 g/dL (ref 13.0–17.0)
MCH: 39.9 pg — ABNORMAL HIGH (ref 26.0–34.0)
MCHC: 35.3 g/dL (ref 30.0–36.0)
MCV: 113 fL — ABNORMAL HIGH (ref 80.0–100.0)
Platelets: 311 10*3/uL (ref 150–400)
RBC: 3.46 MIL/uL — ABNORMAL LOW (ref 4.22–5.81)
RDW: 15.1 % (ref 11.5–15.5)
WBC: 9.5 10*3/uL (ref 4.0–10.5)
nRBC: 0 % (ref 0.0–0.2)

## 2023-01-13 LAB — COMPREHENSIVE METABOLIC PANEL
ALT: 14 U/L (ref 0–44)
AST: 24 U/L (ref 15–41)
Albumin: 2.2 g/dL — ABNORMAL LOW (ref 3.5–5.0)
Alkaline Phosphatase: 75 U/L (ref 38–126)
Anion gap: 9 (ref 5–15)
BUN: 9 mg/dL (ref 8–23)
CO2: 27 mmol/L (ref 22–32)
Calcium: 9.1 mg/dL (ref 8.9–10.3)
Chloride: 90 mmol/L — ABNORMAL LOW (ref 98–111)
Creatinine, Ser: 0.7 mg/dL (ref 0.61–1.24)
GFR, Estimated: >60 mL/min (ref >60)
Glucose, Bld: 95 mg/dL (ref 70–99)
Potassium: 5 mmol/L (ref 3.5–5.1)
Sodium: 126 mmol/L — ABNORMAL LOW (ref 135–145)
Total Bilirubin: 1.2 mg/dL (ref 0.3–1.2)
Total Protein: 6.4 g/dL — ABNORMAL LOW (ref 6.5–8.1)

## 2023-01-13 LAB — GLUCOSE, CAPILLARY
Glucose-Capillary: 101 mg/dL — ABNORMAL HIGH (ref 70–99)
Glucose-Capillary: 99 mg/dL (ref 70–99)
Glucose-Capillary: 141 mg/dL — ABNORMAL HIGH (ref 70–99)

## 2023-01-13 LAB — PHOSPHORUS: Phosphorus: 5.4 mg/dL — ABNORMAL HIGH (ref 2.5–4.6)

## 2023-01-13 LAB — MAGNESIUM: Magnesium: 1.6 mg/dL — ABNORMAL LOW (ref 1.7–2.4)

## 2023-01-13 MED ORDER — SODIUM CHLORIDE 0.9 % IV SOLN: INTRAVENOUS | Status: DC

## 2023-01-13 MED ORDER — SODIUM CHLORIDE 1 G PO TABS
2.0000 g | ORAL_TABLET | Freq: Two times a day (BID) | ORAL | Status: DC
  Administered 2023-01-13 – 2023-01-16 (×7): 2 g via ORAL
  Filled 2023-01-13 (×7): qty 2

## 2023-01-13 MED ORDER — MAGNESIUM SULFATE 2 GM/50ML IV SOLN
2.0000 g | Freq: Once | INTRAVENOUS | Status: AC
  Administered 2023-01-13: 2 g via INTRAVENOUS
  Filled 2023-01-13: qty 50

## 2023-01-13 NOTE — Progress Notes (Signed)
Nutrition Follow-up  DOCUMENTATION CODES:   Non-severe (moderate) malnutrition in context of social or environmental circumstances  INTERVENTION:  Continue Regular diet as ordered Ensure Enlive po TID, each supplement provides 350 kcal and 20 grams of protein. MVI with minerals daily  "High Calorie, High Protein Nutrition Therapy" handout provided for patient Coupons provided for Ensure Given ongoing inadequate PO intake and malnutrition, would recommend consideration of long term nutrition access  NUTRITION DIAGNOSIS:   Moderate Malnutrition related to social / environmental circumstances (EtOH abuse) as evidenced by moderate fat depletion, severe muscle depletion. - remains applicable  GOAL:   Patient will meet greater than or equal to 90% of their needs - goal unmet  MONITOR:   PO intake, Supplement acceptance, Labs, Weight trends, TF tolerance  REASON FOR ASSESSMENT:   Consult Other (Comment) (Please provide nutrition supplement recommendations for post-discharge)  ASSESSMENT:   62 year old male who presented to the ED on 6/04 with AMS. PMH of HTN, COPD, heavy tobacco abuse, GERD, constipation, HA, depression. Pt admitted with severe hyponatremia, acute metabolic encephalopathy.  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 CT chest/abd/pelvis during admission with findings of acute PE  Spoke with pt at bedside. No visitors present. Observed lunch tray untouched on counter. Only few bites taken of breakfast. Cheez-its and bag of grapes also on bedside table. Pt endorses ongoing poor appetite and PO intake. Ensure was increased from BID to TID yesterday. Pt agrees to continue these supplements.   Discussed PO intake with pt's nurse who reports very poor PO intake. Pt was  provided with 2 ensure yesterday however only consumed about half of 1 ensure that she is aware of.   Unfortunately, cortrak was removed yesterday and does not have enteral nutrition access for supplemental nutrition support. Noted no plans for PEG tube. Will continue to optimize intake with nutrition supplements.   Admit weight: 67.9 kg Current weight: 64.9 kg  Medications: Vitamin B12, fovlite, MVI, Vitamin B6, senna, thiamine  Labs: sodium 126, Cr 0.56, phos 5.4, Mg 1.6 (repletion ordered)  Diet Order:   Diet Order             Diet regular Room service appropriate? Yes with Assist; Fluid consistency: Thin; Fluid restriction: 1200 mL Fluid  Diet effective now                   EDUCATION NEEDS:   Education needs have been addressed  Skin:  Skin Assessment: Reviewed RN Assessment  Last BM:  6/9 (type 6 medium)  Height:   Ht Readings from Last 1 Encounters:  01/07/23 5\' 10"  (1.778 m)    Weight:   Wt Readings from Last 1 Encounters:  01/12/23 64.9 kg    Ideal Body Weight:  75.5 kg  BMI:  Body mass index is 20.53 kg/m.  Estimated Nutritional Needs:   Kcal:  2000-2200  Protein:  90-110 grams  Fluid:  >2.0 L  Drusilla Kanner, RDN, LDN Clinical Nutrition

## 2023-01-13 NOTE — TOC Initial Note (Signed)
Transition of Care [facility]) - Initial/Assessment Note    Patient Details  Name: Isaac Rose MRN: [id number] Date of Birth: 1960/11/25  Transition of Care [facility]) CM/SW Contact:    Lorri Frederick, LCSW Phone Number: 01/13/2023, 3:17 PM  Clinical Narrative:        CSW met with pt and wife Zella Ball to discuss PT recommendation for SNF.  Pt does not want to pursue SNF.  Pt and wife live separately in [place], she is about 15 minutes away.  Pt home alone, no current services.  Pt reports he did walk some with mobility team today and believes he will improve further.  Pt would like to see if he could temporarily stay with his sister  Corrie Dandy at [place].  Pt reports PCP in place, Dr Sherwood Gambler.  He does not have DME at home.  Permission given to speak with wife Zella Ball, sister Corrie Dandy.  RNCM informed.          Expected Discharge Plan: Home w Home Health Services Barriers to Discharge: Continued Medical Work up   Patient Goals and CMS Choice Patient states their goals for this hospitalization and ongoing recovery are:: get back home          Expected Discharge Plan and Services In-house Referral: Clinical Social Work   Post Acute Care Choice: Home Health Living arrangements for the past 2 months: Single Family Home                                      Prior Living Arrangements/Services Living arrangements for the past 2 months: Single Family Home Lives with:: Self Patient language and need for interpreter reviewed:: Yes Do you feel safe going back to the place where you live?: Yes      Need for Family Participation in Patient Care: Yes (Comment) Care giver support system in place?: Yes (comment) Current home services: Other (comment) (none) Criminal Activity/Legal Involvement Pertinent to Current Situation/Hospitalization: No - Comment as needed  Activities of Daily Living      Permission Sought/Granted Permission sought to share information with : Family Supports Permission  granted to share information with : Yes, Verbal Permission Granted  Share Information with NAME: wife Zella Ball, sister Corrie Dandy           Emotional Assessment Appearance:: Appears stated age Attitude/Demeanor/Rapport: Engaged Affect (typically observed): Appropriate Orientation: : Oriented to Self, Oriented to Place, Oriented to  Time, Oriented to Situation      Admission diagnosis:  Hyponatremia [E87.1] Patient Active Problem List   Diagnosis Date Noted   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   Multiple subsegmental pulmonary emboli without acute cor pulmonale (HCC) 01/11/2023   Malnutrition of moderate degree 01/08/2023   Hyponatremia 01/07/2023   Encephalopathy acute 01/07/2023   Healthcare maintenance 02/03/2019   Hypertensive urgency    Tobacco abuse    Malignant hypertension 03/02/2018   Chronic daily headache 03/20/2015   Constipation 04/28/2012   Hx of adenomatous colonic polyps 04/28/2012   COPD (chronic obstructive pulmonary disease) (HCC) 04/28/2012   HTN (hypertension) 04/28/2012   Smoking trying to quit 04/28/2012   PCP:  Elfredia Nevins, MD Pharmacy:   [facility] [phone] - [place], [place] - [address] [place] [address] Phone: [phone] Fax: [phone]     Social Determinants of Health (SDOH) Social History: SDOH Screenings   Tobacco Use: High Risk (01/07/2023)   SDOH  Interventions:     Readmission Risk Interventions     No data to display

## 2023-01-13 NOTE — Progress Notes (Signed)
Pt. Had no intake of solids, refused ensures, c/o pain of ABD with palpation, worse pain with cough. MD notified new orders place

## 2023-01-13 NOTE — Progress Notes (Signed)
Progress Note   Patient: Isaac Rose GNF:[id number] DOB: 30-Dec-1960 DOA: 01/07/2023     6 DOS: the patient was seen and examined on 01/13/2023 at 9:39AM      Brief hospital course: 62 y.o. M with hx HTN, smoking, COPD who presented with being found on the floor by wife, confused.  Na 115 and also dural venous sinus thrombosis and PE.    6/4: Admitted at [facility] for hyponatremia, confusion --> transferred to [facility] for 3% saline 6/5: MRI brain shows venous sinus thrombosis, heparin started 6/8: CT C/A/P obtained, shows incidental PE, no malignancy 6/9: Transferred [facility] 6/10 patient sodium level continues to be stable at 126.        Assessment and plan: Cerebral venous sinus thrombosis  Extensive thrombosis of superior sagittal sinus and right transverse sinus extending into right internal jugular vein as well as nonocclusive thrombus in left transverse and sigmoid sinuses.  Etiology of this is unclear.    - Continue apixaban - Follow up hypercoagulable panel with Hematology, referral sent    Severe hyponatremia Na 115 on admission, Uosm elevated and Una elevated, c/w SIADH.  TSH, cortisol normal.    Treated with hypertonic saline in the ICU initially.  Improved to 130, but now trended back down to 125 mmol/L, stable today at 126 - Check urine chemistries again - Start fluid restriction - Hold home HCTZ - Fluid restriction at odds with need for increased calorie intake - Current sodium is 126, Urine sodium is 72 and urine osmolality is 388. - Looks like picture of SIADH will treat with salt tabs  Hypomagnesemia: - Mag level 1.6 replenished with 2 g of magnesium sulfate.   Pulmonary embolus Right leg indeterminate age DVT Incidental findings - See above   Weight loss CT Chest, abdomen and pelvis here without obvious malignancy or metastases. - Follow up with PCP for GI referral after discharge - Follow up with PCP for PSA  - Since following up with  hematology/oncology, would recommend correlating malignancy work up with them   Hypertension BP mostly controlled -Continue labetalol, amlodipine -Resume losartan - Hold home triamterene and HCTZ     Malnutrition, moderate As evidenced by 40 lb weight loss in 3 months, moderate loss of subcutaneous muscle mass and fat.  Swallowing is okay.  No plan for PEG tube at this time. - Consult dietitian for supplement recommendations at d/c - Continue Ensure, increase dose   Vitamin B12 deficiency - Continue vitamin B12 supplement at d/c    Tobacco abuse Cessation recommended    Acute metabolic encephalopathy At baseline, no cognitive impairment, but admitted with disorientation and confusion, now resolved.  Due to hyponatremia.   Alcohol abuse           Subjective: Feeling tired, no appetite.  No fever, no cough, no dyspnea.       Physical Exam: BP 125/85 (BP Location: Left Arm)   Pulse 73   Temp 98.2 F (36.8 C)   Resp 18   Ht 5\' 10"  (1.778 m)   Wt 64.9 kg   SpO2 94%   BMI 20.53 kg/m   Thin adult male, lying in bed, appears weak and tired RRR, no murmurs, no peripheral edema Respiratory normal, lungs clear without rales or wheezes Abdomen soft without tenderness palpation or guarding Diffuse loss of subcutaneous muscle mass and fat Attention normal, psychomotor slowing mild, face symmetric, speech fluent    Data Reviewed: Discussed with neurology Magnesium level CBC normal Sodium 125 up to  126 Cr normal Osmolality low         Disposition: Status is: Inpatient Paitent admitted with encephalopathy and hyponatremia.  Found to have venous sinus thrombosis, PE and DVT.  Started on apixaban  Take out Cortrak today.  If Na improves to high 120s or low 130s, and asymptomatic, likely can go home tomorrow        Author: Harold Hedge, MD 01/13/2023 2:34 PM  For on call review [email or web address].

## 2023-01-13 NOTE — Progress Notes (Signed)
Mobility Specialist Progress Note   01/13/23 1000  Orthostatic Sitting  BP- Sitting 105/74  Orthostatic Standing at 0 minutes  BP- Standing at 0 minutes 106/60  Mobility  Activity Ambulated with assistance in hallway;Transferred from bed to chair;Stood at bedside  Level of Assistance Contact guard assist, steadying assist  Assistive Device Front wheel walker (1/2 distance)  Distance Ambulated (ft) 40 ft  Range of Motion/Exercises Active;All extremities  Activity Response Tolerated fair;RN notified   Patient received in supine and agreeable to participate. Was able to progress mobility without need for physical assistance. Completed bed mobility independently with increased time. Stood with supervision and cues for hand placement. Ambulated min guard with and without RW, no overt LOB or unsteadiness noted. While ambulating in hallway, patient became less responsive and started gazing. Denied dizziness or lightheadedness but requested to return to his room. Required seated rest as symptoms increased and persisted. Assisted patient to room and he was able to ambulate from the door to recliner without incident. Did not obtain BP prior to ambulation but did have a recent documented elevated BP of 146/94. Obtained BP while standing and seated in recliner. No significant change but SBP and DBP dropped compared to initial BP. Transferred back to bed with supervision and was left in supine with all needs met, call bell in reach.   [place] Kamani Lewter, BS EXP Mobility Specialist Please contact via SecureChat or Rehab office at [phone]

## 2023-01-13 NOTE — Evaluation (Signed)
Occupational Therapy Evaluation Patient Details Name: Isaac Rose MRN: [id number] DOB: [birth year] Today's Date: 01/13/2023   History of Present Illness 62 year old male admitted with  with acute encephalopathy and muttering confusion with mild agitation. 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 Lesser, nonocclusive thrombus within the left transverse and  sigmoid venous sinuses. Some superficial venous thrombosis over the  parietal convexities as well; heparin started 6/5;  No evidence of acute stroke; 6/7 Acute pulmonary embolism. Moderate embolic burden. No CT evidence  of right heart strain;  has a past medical history of Chronic constipation, COPD (chronic obstructive pulmonary disease) (HCC), Depression, GERD (gastroesophageal reflux disease), Headache, and Hypertension.   Clinical Impression   Pt admitted for above dx, PTA patient was ind in ambulation and bADLs. Pt having no memory of events leading up to fall. Pt presenting with impaired balance at this time, may need RW upon DC. Pt also having drops in BP when changing positions and with OOB mobility, endorsed throbbing headache after ambulating for 33ft. Pt is min guard for OOB mobility at this time with use of RW. Pt would benefit from continued acute skilled OT services to address above deficits and help transition to next level of care. Patient functioning at [facility] level and would benefit from [facility] services upon DC BUT pt would need more home support if able from family. Pending pt progression and verification of home support he could be HH but if not able to get level of support he may need SNF.     Recommendations for follow up therapy are one component of a multi-disciplinary discharge planning process, led by the attending physician.  Recommendations may be updated based on patient status, additional functional criteria  and insurance authorization.   Assistance Recommended at Discharge Frequent or constant Supervision/Assistance  Patient can return home with the following A little help with walking and/or transfers;A little help with bathing/dressing/bathroom;Assistance with cooking/housework;Assist for transportation;Help with stairs or ramp for entrance    Functional Status Assessment  Patient has had a recent decline in their functional status and demonstrates the ability to make significant improvements in function in a reasonable and predictable amount of time.  Equipment Recommendations  None recommended by OT (RW + Pending pt progression.)    Recommendations for Other Services       Precautions / Restrictions Precautions Precautions: Fall Restrictions Weight Bearing Restrictions: No      Mobility Bed Mobility Overal bed mobility: Needs Assistance Bed Mobility: Supine to Sit, Sit to Supine     Supine to sit: Min guard Sit to supine: Min guard   General bed mobility comments: cues for pt to use hand rails to assist with pushing/pulling up    Transfers Overall transfer level: Needs assistance Equipment used: Rolling walker (2 wheels) Transfers: Sit to/from Stand Sit to Stand: Min guard                  Balance Overall balance assessment: Needs assistance   Sitting balance-Leahy Scale: Fair     Standing balance support: Bilateral upper extremity supported Standing balance-Leahy Scale: Poor                             ADL either performed or assessed with clinical judgement   ADL Overall ADL's : Needs assistance/impaired Eating/Feeding: Independent;Sitting   Grooming: Standing;Wash/dry  face;Min guard   Upper Body Bathing: Sitting;Supervision/ safety;Set up   Lower Body Bathing: Sitting/lateral leans;Supervison/ safety;Set up   Upper Body Dressing : Sitting;Modified independent   Lower Body Dressing: Sitting/lateral leans;Min guard   Toilet  Transfer: Ambulation;Rolling walker (2 wheels);Min guard   Toileting- Clothing Manipulation and Hygiene: Min Aeronautical engineer Details (indicate cue type and reason): deferred, may attempt before DC Functional mobility during ADLs: Min guard;Rolling walker (2 wheels)       Vision Patient Visual Report: No change from baseline Additional Comments: Wears reading glasses. Pt scanning, tracking, and depth perception intact although he occassionally will bump the RW wheels into objects on his RW. He does report poor baseline vision     Perception     Praxis      Pertinent Vitals/Pain Pain Assessment Pain Assessment: 0-10 Pain Score: 3  Pain Location: headache Pain Descriptors / Indicators: Headache, Throbbing Pain Intervention(s): Monitored during session, Limited activity within patient's tolerance, Repositioned, Other (comment) (cool wash cloth applied to head)     Hand Dominance     Extremity/Trunk Assessment Upper Extremity Assessment Upper Extremity Assessment: Generalized weakness   Lower Extremity Assessment Lower Extremity Assessment: Generalized weakness   Cervical / Trunk Assessment Cervical / Trunk Assessment: Normal   Communication Communication Communication: No difficulties   Cognition Arousal/Alertness: Awake/alert Behavior During Therapy: WFL for tasks assessed/performed Overall Cognitive Status: Within Functional Limits for tasks assessed                                 General Comments: Pt partially HOH, has some delayed processing     General Comments  Pt BP 138/88 before session supine in bed, 111/82 after standing 3 mins, 119/83 following ambulation, 128/115 upon return to supine position. BP seems to fluctuate with changes in position    Exercises     Shoulder Instructions      Home Living Family/patient expects to be discharged to:: Private residence Living Arrangements: Alone Available Help  at Discharge: Other (Comment) (may have help from sister at DC, she lives near by) Type of Home: House Home Access: Stairs to enter     Home Layout: One level     Bathroom Shower/Tub: Chief Strategy Officer: Standard         Additional Comments: Pt has no memory of events leading up to admitting fall      Prior Functioning/Environment Prior Level of Function : Independent/Modified Independent                        OT Problem List: Decreased strength;Decreased activity tolerance;Impaired balance (sitting and/or standing)      OT Treatment/Interventions: Self-care/ADL training;DME and/or AE instruction;Therapeutic activities;Patient/family education;Therapeutic exercise;Energy conservation;Balance training    OT Goals(Current goals can be found in the care plan section) Acute Rehab OT Goals Patient Stated Goal: To go home OT Goal Formulation: With patient Time For Goal Achievement: 01/27/23 Potential to Achieve Goals: Good ADL Goals Pt Will Perform Grooming: Independently;standing Pt Will Perform Lower Body Dressing: Independently;sitting/lateral leans Pt Will Transfer to Toilet: with modified independence;ambulating Pt Will Perform Tub/Shower Transfer: ambulating;with modified independence;Tub transfer  OT Frequency: Min 2X/week    Co-evaluation              AM-PAC OT "6 Clicks" Daily Activity     Outcome Measure Help from another person eating meals?:  None Help from another person taking care of personal grooming?: A Little Help from another person toileting, which includes using toliet, bedpan, or urinal?: A Little Help from another person bathing (including washing, rinsing, drying)?: A Little Help from another person to put on and taking off regular upper body clothing?: None Help from another person to put on and taking off regular lower body clothing?: A Little 6 Click Score: 20   End of Session Equipment Utilized During Treatment:  Gait belt;Rolling walker (2 wheels) Nurse Communication: Mobility status  Activity Tolerance: Patient tolerated treatment well Patient left: in bed;with call bell/phone within reach;with family/visitor present;with bed alarm set  OT Visit Diagnosis: Unsteadiness on feet (R26.81);Other abnormalities of gait and mobility (R26.89);History of falling (Z91.81);Muscle weakness (generalized) (M62.81)                Time: 1610-9604 OT Time Calculation (min): 30 min Charges:  OT General Charges $OT Visit: 1 Visit OT Evaluation $OT Eval Moderate Complexity: 1 Mod OT Treatments $Therapeutic Activity: 8-22 mins  01/13/2023  AB, OTR/L  Acute Rehabilitation Services  Office: [phone]   Tristan Schroeder 01/13/2023, 3:49 PM

## 2023-01-14 ENCOUNTER — Other Ambulatory Visit (HOSPITAL_COMMUNITY): Payer: Self-pay

## 2023-01-14 DIAGNOSIS — E871 Hypo-osmolality and hyponatremia: Secondary | ICD-10-CM | POA: Diagnosis not present

## 2023-01-14 LAB — MAGNESIUM: Magnesium: 1.8 mg/dL (ref 1.7–2.4)

## 2023-01-14 LAB — CBC WITH DIFFERENTIAL/PLATELET
Abs Immature Granulocytes: 0 10*3/uL (ref 0.00–0.07)
Basophils Absolute: 0.2 10*3/uL — ABNORMAL HIGH (ref 0.0–0.1)
Basophils Relative: 3 %
Eosinophils Absolute: 0.2 10*3/uL (ref 0.0–0.5)
Eosinophils Relative: 3 %
HCT: 37.7 % — ABNORMAL LOW (ref 39.0–52.0)
Hemoglobin: 13.1 g/dL (ref 13.0–17.0)
Lymphocytes Relative: 20 %
Lymphs Abs: 1.7 10*3/uL (ref 0.7–4.0)
MCH: 39.1 pg — ABNORMAL HIGH (ref 26.0–34.0)
MCHC: 34.7 g/dL (ref 30.0–36.0)
MCV: 112.5 fL — ABNORMAL HIGH (ref 80.0–100.0)
Monocytes Absolute: 1 10*3/uL (ref 0.1–1.0)
Monocytes Relative: 12 %
Neutro Abs: 5.1 10*3/uL (ref 1.7–7.7)
Neutrophils Relative %: 62 %
Platelets: 367 10*3/uL (ref 150–400)
RBC: 3.35 MIL/uL — ABNORMAL LOW (ref 4.22–5.81)
RDW: 14.9 % (ref 11.5–15.5)
WBC: 8.3 10*3/uL (ref 4.0–10.5)
nRBC: 0 % (ref 0.0–0.2)
nRBC: 0 /100 WBC

## 2023-01-14 LAB — COMPREHENSIVE METABOLIC PANEL
ALT: 14 U/L (ref 0–44)
AST: 23 U/L (ref 15–41)
Albumin: 2.2 g/dL — ABNORMAL LOW (ref 3.5–5.0)
Alkaline Phosphatase: 76 U/L (ref 38–126)
Anion gap: 9 (ref 5–15)
BUN: 12 mg/dL (ref 8–23)
CO2: 27 mmol/L (ref 22–32)
Calcium: 8.9 mg/dL (ref 8.9–10.3)
Chloride: 91 mmol/L — ABNORMAL LOW (ref 98–111)
Creatinine, Ser: 0.7 mg/dL (ref 0.61–1.24)
GFR, Estimated: >60 mL/min (ref >60)
Glucose, Bld: 91 mg/dL (ref 70–99)
Potassium: 4.2 mmol/L (ref 3.5–5.1)
Sodium: 127 mmol/L — ABNORMAL LOW (ref 135–145)
Total Bilirubin: 1.1 mg/dL (ref 0.3–1.2)
Total Protein: 6.6 g/dL (ref 6.5–8.1)

## 2023-01-14 MED ORDER — BISACODYL 10 MG RE SUPP: 10.0000 mg | Freq: Once | RECTAL | Status: DC

## 2023-01-14 NOTE — Progress Notes (Signed)
Physical Therapy Treatment Patient Details Name: Isaac Rose MRN: [id number] DOB: Nov 21, 1960 Today's Date: 01/14/2023   History of Present Illness 62 year old male admitted with  with acute encephalopathy and muttering confusion with mild agitation. 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 Lesser, nonocclusive thrombus within the left transverse and  sigmoid venous sinuses. Some superficial venous thrombosis over the  parietal convexities as well; heparin started 6/5;  No evidence of acute stroke; 6/7 Acute pulmonary embolism. Moderate embolic burden. No CT evidence  of right heart strain;  has a past medical history of Chronic constipation, COPD (chronic obstructive pulmonary disease) (HCC), Depression, GERD (gastroesophageal reflux disease), Headache, and Hypertension.    PT Comments    Continuing work on functional mobility and activity tolerance;  Showing excellent progress since last PT session; no reports of dizziness, pt pt not orthostatic with Mob Specialist; Opted to walk with a cane today with good result, pt has a cane; Will update dc recs to getting home (to sister's place?) with [facility] therapies   Recommendations for follow up therapy are one component of a multi-disciplinary discharge planning process, led by the attending physician.  Recommendations may be updated based on patient status, additional functional criteria and insurance authorization.  Follow Up Recommendations  Can patient physically be transported by private vehicle: Yes    Assistance Recommended at Discharge Intermittent Supervision/Assistance  Patient can return home with the following A little help with walking and/or transfers;Assistance with cooking/housework;Assist for transportation   Equipment Recommendations  Gilmer Mor (pt has his own)    Recommendations for Other Services       Precautions  / Restrictions Precautions Precautions: Fall     Mobility  Bed Mobility Overal bed mobility: Needs Assistance Bed Mobility: Supine to Sit     Supine to sit: Supervision          Transfers Overall transfer level: Needs assistance Equipment used: 1 person hand held assist Transfers: Sit to/from Stand Sit to Stand: Min guard           General transfer comment: minguard for safety    Ambulation/Gait Ambulation/Gait assistance: Min guard, Supervision Gait Distance (Feet): 300 Feet Assistive device: Straight cane Gait Pattern/deviations: Step-through pattern, Decreased step length - right, Decreased step length - left       General Gait Details: Slow, mildly unsteady gait; good use of single point cane in R hand for another point of stability; no overt loss of balance   Stairs Stairs: Yes Stairs assistance: Min guard (without physical assist) Stair Management: One rail Right, With cane, Forwards Number of Stairs: 6 General stair comments: Slow, with good use of rail and cane; occasionally using alternating pattern; less control with descending   Wheelchair Mobility    Modified Rankin (Stroke Patients Only)       Balance     Sitting balance-Leahy Scale: Fair       Standing balance-Leahy Scale: Fair                              Cognition Arousal/Alertness: Awake/alert Behavior During Therapy: WFL for tasks assessed/performed Overall Cognitive Status: Within Functional Limits for tasks assessed  General Comments: Pt partially HOH, has some delayed processing        Exercises      General Comments General comments (skin integrity, edema, etc.): Pt mentioned the possibility of going home with his sister      Pertinent Vitals/Pain Pain Assessment Pain Assessment: Faces Faces Pain Scale: No hurt Pain Intervention(s): Monitored during session    Home Living                           Prior Function            PT Goals (current goals can now be found in the care plan section) Acute Rehab PT Goals Patient Stated Goal: Did not state; agreeable to OOB PT Goal Formulation: With patient Time For Goal Achievement: 01/26/23 Potential to Achieve Goals: Good Progress towards PT goals: Progressing toward goals (Will consider upgrading goals next session)    Frequency    Min 4X/week      PT Plan Discharge plan needs to be updated;Equipment recommendations need to be updated    Co-evaluation              AM-PAC PT "6 Clicks" Mobility   Outcome Measure  Help needed turning from your back to your side while in a flat bed without using bedrails?: None Help needed moving from lying on your back to sitting on the side of a flat bed without using bedrails?: None Help needed moving to and from a bed to a chair (including a wheelchair)?: None Help needed standing up from a chair using your arms (e.g., wheelchair or bedside chair)?: None Help needed to walk in hospital room?: None Help needed climbing 3-5 steps with a railing? : A Little 6 Click Score: 23    End of Session   Activity Tolerance: Patient tolerated treatment well Patient left: in chair;with call bell/phone within reach;with chair alarm set Nurse Communication: Mobility status PT Visit Diagnosis: Unsteadiness on feet (R26.81);Other abnormalities of gait and mobility (R26.89)     Time: 1610-9604 PT Time Calculation (min) (ACUTE ONLY): 19 min  Charges:  $Gait Training: 8-22 mins                     Van Clines, PT  Acute Rehabilitation Services Office [phone] Secure Chat welcomed    Isaac Rose 01/14/2023, 4:34 PM

## 2023-01-14 NOTE — Progress Notes (Signed)
Physical Therapy Note  Full note to follow;  Very nice functional progress; Agree with OT, and recommend HHPT follow up for transition out of hospital;  [place] discussed the possibility of getting staying with his sister;  He has a single point cane at home (made it himself from [facility]);  Will continue to follow,   Van Clines, PT  Acute Rehabilitation Services Office [phone]

## 2023-01-14 NOTE — Progress Notes (Signed)
Mobility Specialist Progress Note   01/14/23 1001  Orthostatic Lying   BP- Lying 135/82  Pulse- Lying 72  Orthostatic Sitting  BP- Sitting (!) 111/93  Pulse- Sitting 109  Orthostatic Standing at 0 minutes  BP- Standing at 0 minutes 117/86  Pulse- Standing at 0 minutes 87  Mobility  Activity Ambulated with assistance in hallway;Dangled on edge of bed  Level of Assistance Standby assist, set-up cues, supervision of patient - no hands on  Assistive Device Front wheel walker  Distance Ambulated (ft) 120 ft  Range of Motion/Exercises Active;All extremities  Activity Response Tolerated well   Patient received in supine and agreeable to participate. Prior to ambulation completed ortho VS. With positional changes SBP dropped 18-24 mmHg but asymptomatic throughout session. Ambulated supervision level with slow steady gait. Noted tendency to veer right requiring cueing to correct. Returned to room without complaint or incident. Was left dangling EOB with all needs met, call bell in reach.   Isaac Rose, BS EXP Mobility Specialist Please contact via SecureChat or Rehab office at [phone]

## 2023-01-14 NOTE — TOC Transition Note (Signed)
Transition of Care [facility]) - CM/SW Discharge Note   Patient Details  Name: Isaac Rose MRN: [id number] Date of Birth: [birth year]  [facility] CM/SW Contact:  Lawerance Sabal, RN Phone Number: 01/14/2023, 2:49 PM   Clinical Narrative:     Sherron Monday w patient at bedside.  He states that he is going to either go to his house, with support from family or stay with his sister who lives close by.  HH services set up through [facility] place [facility] PT OT orders. We discussed DME and he said that he worked with PT this morning and used a RW but discussed with PT that feels like he would do well with his cane at home.  No DME needs at this time.  Request for test claim for Eliquis sent to pharmacy, will need both 30 day and $10 copay cards at DC     Barriers to Discharge: Continued Medical Work up   Patient Goals and CMS Choice      Discharge Placement                         Discharge Plan and Services Additional resources added to the After Visit Summary for   In-house Referral: Clinical Social Work   Post Acute Care Choice: Home Health                    HH Arranged: PT, OT [facility] Agency: [facility] Date [facility] Agency Contacted: 01/14/23 Time HH Agency Contacted: 1449 Representative spoke with at [facility] Agency: Kandee Keen  Social Determinants of Health (SDOH) Interventions SDOH Screenings   Tobacco Use: High Risk (01/07/2023)     Readmission Risk Interventions     No data to display

## 2023-01-14 NOTE — Progress Notes (Signed)
Progress Note   Patient: Isaac Rose:[id number] DOB: 01-14-1961 DOA: 01/07/2023     7 DOS: the patient was seen and examined on 01/14/2023 at 9:39AM      Brief hospital course: 62 y.o. M with hx HTN, smoking, COPD who presented with being found on the floor by wife, confused.  Na 115 and also dural venous sinus thrombosis and PE.    6/4: Admitted at [facility] for hyponatremia, confusion --> transferred to [facility] for 3% saline 6/5: MRI brain shows venous sinus thrombosis, heparin started 6/8: CT C/A/P obtained, shows incidental PE, no malignancy 6/9: Transferred OOU 6/10 patient sodium level continues to be stable at 126. 6/11 patient had no nausea and vomiting yesterday, diet downgraded to clear liquid diet, abdominal x-ray does not show any signs of SBO.  Level today is 127.        Assessment and plan: Cerebral venous sinus thrombosis  Extensive thrombosis of superior sagittal sinus and right transverse sinus extending into right internal jugular vein as well as nonocclusive thrombus in left transverse and sigmoid sinuses.  Etiology of this is unclear.    - Continue apixaban - Follow up hypercoagulable panel with Hematology, referral sent    Severe hyponatremia Na 115 on admission, Uosm elevated and Una elevated, c/w SIADH.  TSH, cortisol normal.    Treated with hypertonic saline in the ICU initially.  Improved to 130, but now trended back down to 125 mmol/L, stable today at 126 - Check urine chemistries again - Start fluid restriction - Hold home HCTZ - Fluid restriction at odds with need for increased calorie intake - Current sodium is 126, Urine sodium is 72 and urine osmolality is 388. - Looks like picture of SIADH will treat with salt tabs for 2 days starting 6/10, however patient had nausea and vomiting not sure if the patient was able to retain the Salt Tab - Today sodium level is 127  Hypomagnesemia: - Mag level 1.6 replenished with 2 g of magnesium  sulfate.  Nausea/vomiting: -Abdominal x-ray is negative for any SBO with normal bowel gas pattern The patient however has been constipated and has had bowel movement only several days ago. - Downgrade the diet to clear liquid diet and ordered Dulcolax suppository.   Pulmonary embolus Right leg indeterminate age DVT Incidental findings - See above   Weight loss CT Chest, abdomen and pelvis here without obvious malignancy or metastases. - Follow up with PCP for GI referral after discharge - Follow up with PCP for PSA  - Since following up with hematology/oncology, would recommend correlating malignancy work up with them   Hypertension BP mostly controlled -Continue labetalol, amlodipine -Resume losartan - Hold home triamterene and HCTZ     Malnutrition, moderate As evidenced by 40 lb weight loss in 3 months, moderate loss of subcutaneous muscle mass and fat.  Swallowing is okay.  No plan for PEG tube at this time. - Consult dietitian for supplement recommendations at d/c - Continue Ensure, increase dose   Vitamin B12 deficiency - Continue vitamin B12 supplement at d/c    Tobacco abuse Cessation recommended    Acute metabolic encephalopathy At baseline, no cognitive impairment, but admitted with disorientation and confusion, now resolved.  Due to hyponatremia.   Alcohol abuse           Subjective: Seen and examined bedside today.  Patient currently denies having any nausea and vomiting.  Yesterday patient did complain of having 1 episode of vomiting.  Denies having any  abdominal pain.   Physical Exam: BP (!) 112/59 (BP Location: Right Arm)   Pulse 69   Temp 97.9 F (36.6 C) (Oral)   Resp 17   Ht 5\' 10"  (1.778 m)   Wt 64.9 kg   SpO2 98%   BMI 20.53 kg/m   Thin adult male, lying in bed, appears weak and tired RRR, no murmurs, no peripheral edema Respiratory normal, lungs clear without rales or wheezes Abdomen soft without tenderness palpation or  guarding Diffuse loss of subcutaneous muscle mass and fat Attention normal, psychomotor slowing mild, face symmetric, speech fluent    Data Reviewed: Discussed with neurology Magnesium level CBC normal Sodium 125 up to 126 Cr normal Osmolality low         Disposition: Status is: Inpatient Paitent admitted with encephalopathy and hyponatremia.  Found to have venous sinus thrombosis, PE and DVT.  Started on apixaban  Take out Cortrak today.  If Na improves to high 120s or low 130s, and asymptomatic, likely can go home tomorrow        Author: Harold Hedge, MD 01/14/2023 1:29 PM  For on call review [email or web address].

## 2023-01-15 ENCOUNTER — Other Ambulatory Visit (HOSPITAL_COMMUNITY): Payer: Self-pay

## 2023-01-15 DIAGNOSIS — G08 Intracranial and intraspinal phlebitis and thrombophlebitis: Secondary | ICD-10-CM | POA: Diagnosis not present

## 2023-01-15 DIAGNOSIS — R634 Abnormal weight loss: Secondary | ICD-10-CM

## 2023-01-15 DIAGNOSIS — G9340 Encephalopathy, unspecified: Secondary | ICD-10-CM | POA: Diagnosis not present

## 2023-01-15 DIAGNOSIS — E440 Moderate protein-calorie malnutrition: Secondary | ICD-10-CM

## 2023-01-15 DIAGNOSIS — E871 Hypo-osmolality and hyponatremia: Secondary | ICD-10-CM | POA: Diagnosis not present

## 2023-01-15 LAB — COMPREHENSIVE METABOLIC PANEL
ALT: 15 U/L (ref 0–44)
AST: 24 U/L (ref 15–41)
Albumin: 2.4 g/dL — ABNORMAL LOW (ref 3.5–5.0)
Alkaline Phosphatase: 78 U/L (ref 38–126)
Anion gap: 9 (ref 5–15)
BUN: 10 mg/dL (ref 8–23)
CO2: 24 mmol/L (ref 22–32)
Calcium: 8.5 mg/dL — ABNORMAL LOW (ref 8.9–10.3)
Chloride: 94 mmol/L — ABNORMAL LOW (ref 98–111)
Creatinine, Ser: 0.59 mg/dL — ABNORMAL LOW (ref 0.61–1.24)
GFR, Estimated: >60 mL/min (ref >60)
Glucose, Bld: 80 mg/dL (ref 70–99)
Potassium: 4.2 mmol/L (ref 3.5–5.1)
Sodium: 127 mmol/L — ABNORMAL LOW (ref 135–145)
Total Bilirubin: 1 mg/dL (ref 0.3–1.2)
Total Protein: 6.5 g/dL (ref 6.5–8.1)

## 2023-01-15 LAB — CBC
HCT: 36.4 % — ABNORMAL LOW (ref 39.0–52.0)
Hemoglobin: 12.8 g/dL — ABNORMAL LOW (ref 13.0–17.0)
MCH: 39.9 pg — ABNORMAL HIGH (ref 26.0–34.0)
MCHC: 35.2 g/dL (ref 30.0–36.0)
MCV: 113.4 fL — ABNORMAL HIGH (ref 80.0–100.0)
Platelets: 381 10*3/uL (ref 150–400)
RBC: 3.21 MIL/uL — ABNORMAL LOW (ref 4.22–5.81)
RDW: 14.8 % (ref 11.5–15.5)
WBC: 8.4 10*3/uL (ref 4.0–10.5)
nRBC: 0 % (ref 0.0–0.2)

## 2023-01-15 LAB — MAGNESIUM: Magnesium: 1.7 mg/dL (ref 1.7–2.4)

## 2023-01-15 LAB — PHOSPHORUS: Phosphorus: 3.4 mg/dL (ref 2.5–4.6)

## 2023-01-15 MED ORDER — ALUM & MAG HYDROXIDE-SIMETH 200-200-20 MG/5ML PO SUSP: 30.0000 mL | Freq: Four times a day (QID) | ORAL | Status: DC | PRN

## 2023-01-15 MED ORDER — MAGNESIUM SULFATE 2 GM/50ML IV SOLN
2.0000 g | Freq: Once | INTRAVENOUS | Status: AC
  Administered 2023-01-15: 2 g via INTRAVENOUS
  Filled 2023-01-15: qty 50

## 2023-01-15 MED ORDER — PANTOPRAZOLE SODIUM 40 MG PO TBEC
40.0000 mg | DELAYED_RELEASE_TABLET | Freq: Two times a day (BID) | ORAL | Status: DC
  Administered 2023-01-15 – 2023-01-16 (×3): 40 mg via ORAL
  Filled 2023-01-15 (×3): qty 1

## 2023-01-15 MED ORDER — PANTOPRAZOLE SODIUM 40 MG PO TBEC: 40.0000 mg | DELAYED_RELEASE_TABLET | Freq: Every day | ORAL | Status: DC

## 2023-01-15 MED ORDER — LOSARTAN POTASSIUM 50 MG PO TABS
50.0000 mg | ORAL_TABLET | Freq: Every day | ORAL | Status: DC
  Administered 2023-01-15 – 2023-01-16 (×2): 50 mg via ORAL
  Filled 2023-01-15 (×2): qty 1

## 2023-01-15 MED ORDER — UMECLIDINIUM-VILANTEROL 62.5-25 MCG/ACT IN AEPB
1.0000 | INHALATION_SPRAY | Freq: Every day | RESPIRATORY_TRACT | Status: DC
  Administered 2023-01-16: 1 via RESPIRATORY_TRACT
  Filled 2023-01-15: qty 14

## 2023-01-15 NOTE — Progress Notes (Signed)
Patient's iv was beeping.  This nurse went into room to find that patient disconnected himself from the central line and  without notifying staff.  Educated patient regarding central line and the importance of having staff handle any connecting and disconnecting to decrease the risk of infection.  Patient did not seem to be interested in learning or following information provided.

## 2023-01-15 NOTE — Progress Notes (Signed)
Mobility Specialist Progress Note:    01/15/23 1330  Mobility  Activity Ambulated with assistance in hallway  Level of Assistance Contact guard assist, steadying assist  Assistive Device Other (Comment) (IV pole)  Distance Ambulated (ft) 550 ft  Activity Response Tolerated well  Mobility Referral Yes  $Mobility charge 1 Mobility  Mobility Specialist Start Time (ACUTE ONLY) 1124  Mobility Specialist Stop Time (ACUTE ONLY) 1137  Mobility Specialist Time Calculation (min) (ACUTE ONLY) 13 min   Received pt in bed having no complaints and agreeable to mobility. Pt was asymptomatic throughout ambulation and returned to room w/o fault. Left in bed w/ call bell in reach and all needs met.   Thompson Grayer Mobility Specialist  Please contact vis Secure Chat or  Rehab Office [phone]

## 2023-01-15 NOTE — Progress Notes (Signed)
This nurse was notified by Patients sister at bedside that the patient can not come live with her when discharge. Stated " she will not take care of him, she has to take care of her husband, he need rehab"

## 2023-01-15 NOTE — Progress Notes (Addendum)
Occupational Therapy Treatment Patient Details Name: Isaac Rose MRN: [id number] DOB: 1961-03-22 Today's Date: 01/15/2023   History of present illness 62 year old male admitted with  with acute encephalopathy and muttering confusion with mild agitation. 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 Lesser, nonocclusive thrombus within the left transverse and  sigmoid venous sinuses. Some superficial venous thrombosis over the  parietal convexities as well; heparin started 6/5;  No evidence of acute stroke; 6/7 Acute pulmonary embolism. Moderate embolic burden. No CT evidence  of right heart strain;  has a past medical history of Chronic constipation, COPD (chronic obstructive pulmonary disease) (HCC), Depression, GERD (gastroesophageal reflux disease), Headache, and Hypertension.   OT comments  Pt continuing to progress in OT sessions, pt completing OOB mobility with min guard assist and brief periods without AD. Pt even performing iADL of making bed with min guard assist. Per family report, pt's cognition is still a bit off and suggesting he may have a problem with alcohol consumption. Discussed with case worker to give pt resources for change. Trialed tub shower with wall walking, Pt needing min cues for RW placement and safety with shifting. OT to continue to progress pt as able. DC plans updated as patient is progressing well with ADLs and mobility, anticipate no follow-up OT.    Recommendations for follow up therapy are one component of a multi-disciplinary discharge planning process, led by the attending physician.  Recommendations may be updated based on patient status, additional functional criteria and insurance authorization.    Assistance Recommended at Discharge Frequent or constant Supervision/Assistance  Patient can return home with the following  A little help with walking  and/or transfers;A little help with bathing/dressing/bathroom;Assistance with cooking/housework;Assist for transportation;Help with stairs or ramp for entrance   Equipment Recommendations  None recommended by OT (RW)    Recommendations for Other Services Other (comment) (Discussed with Casework family concerns of alcohol abuse and AA resources if needed)    Precautions / Restrictions Precautions Precautions: Fall Restrictions Weight Bearing Restrictions: No       Mobility Bed Mobility Overal bed mobility: Needs Assistance Bed Mobility: Supine to Sit, Sit to Supine     Supine to sit: Supervision Sit to supine: Supervision        Transfers Overall transfer level: Needs assistance Equipment used: None Transfers: Sit to/from Stand Sit to Stand: Min guard           General transfer comment: STSx2 from EOB     Balance Overall balance assessment: Needs assistance   Sitting balance-Leahy Scale: Fair     Standing balance support: Bilateral upper extremity supported Standing balance-Leahy Scale: Fair                             ADL either performed or assessed with clinical judgement   ADL                                   Tub/ Shower Transfer: Min guard;Ambulation;Rolling walker (2 wheels) Tub/Shower Transfer Details (indicate cue type and reason): Educated pt on wall walking to support UEs to get in/out of shower. Pt reports he has a shower seat at home to sit on once in shower Functional mobility during ADLs: Min guard;Rolling walker (2 wheels) General ADL Comments:  Pt initially used RW, ambulated short distances no AD and min guard    Extremity/Trunk Assessment              Vision       Perception     Praxis      Cognition Arousal/Alertness: Awake/alert Behavior During Therapy: WFL for tasks assessed/performed Overall Cognitive Status: Impaired/Different from baseline Area of Impairment: Problem solving                              Problem Solving: Slow processing General Comments: Pt family reporting difference in cognition. Pt following commands and completing bADLs appropriately        Exercises      Shoulder Instructions       General Comments Per discussion with pt sister, the pt will not be going home with her upon DC.    Pertinent Vitals/ Pain       Pain Assessment Pain Assessment: No/denies pain  Home Living                                          Prior Functioning/Environment              Frequency  Min 2X/week        Progress Toward Goals  OT Goals(current goals can now be found in the care plan section)  Progress towards OT goals: Progressing toward goals  Acute Rehab OT Goals Patient Stated Goal: To go home OT Goal Formulation: With patient Time For Goal Achievement: 01/27/23 Potential to Achieve Goals: Good  Plan Frequency remains appropriate;Discharge plan needs to be updated    Co-evaluation                 AM-PAC OT "6 Clicks" Daily Activity     Outcome Measure   Help from another person eating meals?: None Help from another person taking care of personal grooming?: A Little Help from another person toileting, which includes using toliet, bedpan, or urinal?: A Little Help from another person bathing (including washing, rinsing, drying)?: A Little Help from another person to put on and taking off regular upper body clothing?: None Help from another person to put on and taking off regular lower body clothing?: A Little 6 Click Score: 20    End of Session Equipment Utilized During Treatment: Gait belt;Rolling walker (2 wheels)  OT Visit Diagnosis: Unsteadiness on feet (R26.81);Other abnormalities of gait and mobility (R26.89);History of falling (Z91.81);Muscle weakness (generalized) (M62.81)   Activity Tolerance Patient tolerated treatment well   Patient Left in bed;with call bell/phone within reach;with bed alarm  set   Nurse Communication Mobility status        Time: 1610-9604 OT Time Calculation (min): 17 min  Charges: OT General Charges $OT Visit: 1 Visit OT Treatments $Therapeutic Activity: 8-22 mins  01/15/2023  AB, OTR/L  Acute Rehabilitation Services  Office: [phone]   Tristan Schroeder 01/15/2023, 1:00 PM

## 2023-01-15 NOTE — Plan of Care (Signed)

## 2023-01-15 NOTE — Progress Notes (Signed)
PROGRESS NOTE    Isaac Rose  ZOX:[id number] DOB: 11-Mar-1961 DOA: 01/07/2023 PCP: Elfredia Nevins, MD   Brief Narrative:  The patient is a 62 y.o. M with hx HTN, smoking, COPD who presented to the hospital after being found on the floor by wife being confused.  On admission Na+ was 115 and was found to have a dural venous sinus thrombosis and PE.    6/4: Admitted at [facility] for hyponatremia, confusion --> transferred to [facility] for 3% saline 6/5: MRI brain shows venous sinus thrombosis, heparin started 6/8: CT C/A/P obtained, shows incidental PE, no malignancy 6/9: Transferred OOU 6/10 patient sodium level continues to be stable at 126. 6/11 patient had no nausea and vomiting yesterday, diet downgraded to clear liquid diet, abdominal x-ray does not show any signs of SBO.  Level was 127  **6/12 AM remains the same and patient is nausea vomiting is improved and he had a bowel movement.  Requesting PPI.  Will advance diet to see if he tolerates and anticipate discharge in next 24 to 48 hours if sodium improves a little bit closer to 130   Assessment and plan:  Cerebral Venous Sinus Thrombosis  -Extensive thrombosis of superior sagittal sinus and right transverse sinus extending into right internal jugular vein as well as nonocclusive thrombus in left transverse and sigmoid sinuses.   -Etiology of this is unclear.    -Continue Anticoagulation with Apixaban -Follow up hypercoagulable panel with Hematology, referral sent    Severe Hyponatremia -Na 115 on admission, Uosm elevated and Una elevated, c/w SIADH.   -TSH, cortisol normal.   -Treated with hypertonic saline in the ICU initially.  -Improved to 130, but now trended back down to 125 mmol/L, stable today at 126 -Check urine chemistries again -Start fluid restriction -Continue to Hold home HCTZ -Fluid restriction at odds with need for increased calorie intake -Urine sodium is 72 and urine osmolality is 388. -Looks like picture of SIADH  will treat with salt tabs for 2 days starting 6/10, however patient had nausea and vomiting not sure if the patient was able to retain the Salt Tab -Na+ Trend: Recent Labs  Lab 01/09/23 1717 01/10/23 0512 01/11/23 1010 01/12/23 0849 01/13/23 0523 01/14/23 0445 01/15/23 1024  NA 128* 128* 125* 126* 126* 127* 127*    Hypomagnesemia -Patient's Mag Level Trend: Recent Labs  Lab 01/09/23 0300 01/10/23 0512 01/11/23 0412 01/12/23 0154 01/13/23 0523 01/14/23 0445 01/15/23 1024  MG 1.5* 1.4* 1.6* 1.6* 1.6* 1.8 1.7  -Replete with IV Mag Sulfate 2 grams -Continue to Monitor and Replete as Necessary -Repeat Mag in the AM   Nausea/Vomiting -Abdominal x-ray is negative for any SBO with normal bowel gas pattern -The patient however has been constipated and has had bowel movement only several days ago. -Downgraded the diet to clear liquid diet and ordered Dulcolax suppository but had a bowel movement  -Going back to a Soft Diet today with PPI BID   Macrocytic Anemia -Hgb/Hct Trend: Recent Labs  Lab 01/09/23 0300 01/10/23 0512 01/11/23 0412 01/12/23 0154 01/13/23 0523 01/14/23 0445 01/15/23 0315  HGB 14.4 13.4 15.2 14.0 13.8 13.1 12.8*  HCT 39.8 36.8* 41.8 39.7 39.1 37.7* 36.4*  MCV 109.6* 110.2* 111.2* 114.1* 113.0* 112.5* 113.4*  -Check Anemia Panel in the AM -Continue to Monitor for S/Sx of Bleeding; No overt bleeding noted -Repeat CBC in the AM   Pulmonary Embolus Right Leg Indeterminate age DVT -Incidental findings -See above -C/w Anticoagulation   Weight loss -CT Chest,  abdomen and pelvis here without obvious malignancy or metastases. -Follow up with PCP for GI referral after discharge -Follow up with PCP for PSA  -Since following up with hematology/oncology, would recommend correlating malignancy work up with them -Nutritionist consult as below   Hypertension -BP mostly controlled -Continue Amodipine 10 mg po Daily and Losartan 50 mg po Daily -Continue to  Hold home Triamterene and HCTZ -Continue to Monitor BP per Protocol -Last BP reading was 127/101   Malnutrition, moderate -As evidenced by 40 lb weight loss in 3 months, moderate loss of subcutaneous muscle mass and fat.  -Swallowing is okay.  No plan for PEG tube at this time. -Consult dietitian for supplement recommendations  Nutrition Status: Nutrition Problem: Moderate Malnutrition Etiology: social / environmental circumstances (EtOH abuse) Signs/Symptoms: moderate fat depletion, severe muscle depletion Interventions: Ensure Enlive (each supplement provides 350kcal and 20 grams of protein), MVI, Refer to RD note for recommendations, Education -Continue Ensure, increase dose   Vitamin B12 deficiency -Continue Cyanocobalamin 1000 mcg po Daily supplement at d/c    Tobacco Abuse -Cessation recommended    Acute Metabolic Encephalopathy -At baseline, no cognitive impairment, but admitted with disorientation and confusion, now resolved.   -Hyponatremia likely contributed -Continue to Hold Home Triamterene and HCTZ  GERD with Indigestion/GI Prophylaxis -Start PPI with Pantoprazole 40 mg po BID and add Maalox -C/w Famotidine 20 mg po Daily   Alcohol Abuse -Continue to Monitor for Signs of Withdrawal   Hypoalbuminemia -Patient's Albumin Trend: Recent Labs  Lab 01/07/23 1621 01/08/23 0732 01/11/23 1010 01/13/23 0523 01/14/23 0445 01/15/23 1024  ALBUMIN 2.5* 2.1* 2.2* 2.2* 2.2* 2.4*  -Continue to Monitor and Trend and repeat CMP in the AM   DVT prophylaxis: SCDs Start: 01/07/23 2030 apixaban (ELIQUIS) tablet 10 mg  apixaban (ELIQUIS) tablet 5 mg    Code Status: Full Code Family Communication: Discussed with family at bedside  Disposition Plan:  Level of care: Med-Surg Status is: Inpatient Remains inpatient appropriate because: Needs to ensure that he can tolerate a diet prior to discharge and his core track has been removed and diet has been advanced now   Consultants:   PCCM Transfer  Procedures:  As delineated as above   Antimicrobials:  Anti-infectives (From admission, onward)    None       Subjective: Seen and examined at bedside and he was doing okay and states that he had a bowel movement yesterday.  No nausea or vomiting today.  Sodium still remains on the lower side.  Feels okay.  Denies any other concerns or complaints this time.  Objective: Vitals:   01/15/23 0729 01/15/23 0852 01/15/23 1120 01/15/23 1339  BP:  109/75 (!) 145/93 (!) 127/101  Pulse:  79  85  Resp:  17  17  Temp:  98.2 F (36.8 C)  98 F (36.7 C)  TempSrc:  Oral  Oral  SpO2: 97% 99%  98%  Weight:      Height:        Intake/Output Summary (Last 24 hours) at 01/15/2023 1832 Last data filed at 01/15/2023 1500 Gross per 24 hour  Intake 580 ml  Output 625 ml  Net -45 ml   Filed Weights   01/08/23 0710 01/10/23 0500 01/12/23 0352  Weight: 68.3 kg 68.3 kg 64.9 kg   Examination: Physical Exam:  Constitutional: Thin Caucasian male in no acute distress Respiratory: Diminished to auscultation bilaterally, no wheezing, rales, rhonchi or crackles. Normal respiratory effort and patient is not tachypenic. No accessory muscle  use.  Unlabored breathing Cardiovascular: RRR, no murmurs / rubs / gallops. S1 and S2 auscultated. No extremity edema.  Abdomen: Soft, non-tender, non-distended. Bowel sounds positive.  GU: Deferred. Musculoskeletal: No clubbing / cyanosis of digits/nails. No joint deformity upper and lower extremities.  Skin: No rashes, lesions, ulcers on limited skin evaluation. No induration; Warm and dry.  Neurologic: CN 2-12 grossly intact with no focal deficits. Romberg sign and cerebellar reflexes not assessed.  Psychiatric: Normal judgment and insight. Alert and oriented x 3. Normal mood and appropriate affect.   Data Reviewed: I have personally reviewed following labs and imaging studies  CBC: Recent Labs  Lab 01/11/23 0412 01/12/23 0154  01/13/23 0523 01/14/23 0445 01/15/23 0315  WBC 8.8 8.1 9.5 8.3 8.4  NEUTROABS  --   --   --  5.1  --   HGB 15.2 14.0 13.8 13.1 12.8*  HCT 41.8 39.7 39.1 37.7* 36.4*  MCV 111.2* 114.1* 113.0* 112.5* 113.4*  PLT 201 213 311 367 381   Basic Metabolic Panel: Recent Labs  Lab 01/10/23 0512 01/11/23 0412 01/11/23 1010 01/12/23 0154 01/12/23 0849 01/13/23 0523 01/14/23 0445 01/15/23 1024  NA 128*  --  125*  --  126* 126* 127* 127*  K 3.2*  --  3.6  --  4.5 5.0 4.2 4.2  CL 94*  --  88*  --  90* 90* 91* 94*  CO2 26  --  25  --  26 27 27 24   GLUCOSE 129*  --  119*  --  105* 95 91 80  BUN 6*  --  9  --  11 9 12 10   CREATININE 0.61  --  0.56*  --  0.56* 0.70 0.70 0.59*  CALCIUM 7.8*  --  8.6*  --  9.1 9.1 8.9 8.5*  MG 1.4* 1.6*  --  1.6*  --  1.6* 1.8 1.7  PHOS 2.9 3.4  --  5.8*  --  5.4*  --  3.4   GFR: Estimated Creatinine Clearance: 87.9 mL/min (A) (by C-G formula based on SCr of 0.59 mg/dL (L)). Liver Function Tests: Recent Labs  Lab 01/11/23 1010 01/13/23 0523 01/14/23 0445 01/15/23 1024  AST 25 24 23 24   ALT 14 14 14 15   ALKPHOS 96 75 76 78  BILITOT 1.2 1.2 1.1 1.0  PROT 6.3* 6.4* 6.6 6.5  ALBUMIN 2.2* 2.2* 2.2* 2.4*   No results for input(s): "LIPASE", "AMYLASE" in the last 168 hours. No results for input(s): "AMMONIA" in the last 168 hours. Coagulation Profile: No results for input(s): "INR", "PROTIME" in the last 168 hours. Cardiac Enzymes: No results for input(s): "CKTOTAL", "CKMB", "CKMBINDEX", "TROPONINI" in the last 168 hours. BNP (last 3 results) No results for input(s): "PROBNP" in the last 8760 hours. HbA1C: No results for input(s): "HGBA1C" in the last 72 hours. CBG: Recent Labs  Lab 01/12/23 1204 01/12/23 1654 01/13/23 0754 01/13/23 1214 01/13/23 1605  GLUCAP 127* 105* 101* 99 141*   Lipid Profile: No results for input(s): "CHOL", "HDL", "LDLCALC", "TRIG", "CHOLHDL", "LDLDIRECT" in the last 72 hours. Thyroid Function Tests: No results for  input(s): "TSH", "T4TOTAL", "FREET4", "T3FREE", "THYROIDAB" in the last 72 hours. Anemia Panel: No results for input(s): "VITAMINB12", "FOLATE", "FERRITIN", "TIBC", "IRON", "RETICCTPCT" in the last 72 hours. Sepsis Labs: No results for input(s): "PROCALCITON", "LATICACIDVEN" in the last 168 hours.  Recent Results (from the past 240 hour(s))  Urine Culture     Status: None   Collection Time: 01/07/23  4:35 PM  Specimen: Urine, Catheterized  Result Value Ref Range Status   Specimen Description   Final    URINE, CATHETERIZED Performed at [facility], [address], [place], [place] [address]    Special Requests   Final    NONE Reflexed from [id number] Performed at [facility], [address] [place], [place] [address]    Culture   Final    NO GROWTH Performed at [facility], [address] [place], [place] [address]    Report Status 01/08/2023 FINAL  Final  MRSA Next Gen by PCR, Nasal     Status: None   Collection Time: 01/07/23  8:01 PM   Specimen: Nasal Mucosa; Nasal Swab  Result Value Ref Range Status   MRSA by PCR Next Gen NOT DETECTED NOT DETECTED Final    Comment: (NOTE) The GeneXpert MRSA Assay (FDA approved for NASAL specimens only), is one component of a comprehensive MRSA colonization surveillance program. It is not intended to diagnose MRSA infection nor to guide or monitor treatment for MRSA infections. Test performance is not FDA approved in patients less than 79 years old. Performed at [facility], [address], [place], [place] [address]     Radiology Studies: No results found.  Scheduled Meds:  amLODipine  10 mg Oral Daily   apixaban  10 mg Oral BID   Followed by   Melene Muller ON 01/17/2023] apixaban  5 mg Oral BID   Chlorhexidine Gluconate Cloth  6 each Topical Daily   vitamin B-12  1,000 mcg Per Tube Daily   famotidine  20 mg Per Tube Daily   feeding supplement  237 mL Oral TID BM   folic acid  1 mg Oral Daily   losartan  50 mg Oral  Daily   multivitamin with minerals  1 tablet Oral Daily   mouth rinse  15 mL Mouth Rinse 4 times per day   pantoprazole  40 mg Oral BID   pyridOXINE  50 mg Oral Daily   senna-docusate  1 tablet Oral Daily   sodium chloride flush  10-40 mL Intracatheter Q12H   sodium chloride  2 g Oral BID WC   thiamine  100 mg Oral Daily   umeclidinium-vilanterol  1 puff Inhalation Daily   Continuous Infusions:  sodium chloride Stopped (01/11/23 1401)   sodium chloride 50 mL/hr at 01/15/23 0811   magnesium sulfate bolus IVPB      LOS: 8 days   Marguerita Merles, DO [facility] Available via Epic secure chat 7am-7pm After these hours, please refer to coverage provider listed on [email or web address] 01/15/2023, 6:32 PM

## 2023-01-15 NOTE — TOC Benefit Eligibility Note (Signed)
Pharmacy Patient Advocate Encounter  Insurance verification completed.    The patient is insured through [facility] [employer]  Ran test claim for [employer] 2.5-2.5 [employer] and the current 30 day co-pay is $47.00.  Ran test claim for Anoro Ellipta 62.5-25 mcg/Act Aepb and the current 30 day co-pay is $47.00.  Ran test claim for [employer] 9-4.8 mcg/act and Not on Formulary.  This test claim was processed through [facility]- copay amounts may vary at other pharmacies due to pharmacy/plan contracts, or as the patient moves through the different stages of their insurance plan.    Roland Earl, CPHT Pharmacy Patient Advocate Specialist [facility] Patient Advocate Team Direct Number: [phone]  Fax: [phone]

## 2023-01-16 ENCOUNTER — Other Ambulatory Visit (HOSPITAL_COMMUNITY): Payer: Self-pay

## 2023-01-16 DIAGNOSIS — E871 Hypo-osmolality and hyponatremia: Secondary | ICD-10-CM | POA: Diagnosis not present

## 2023-01-16 DIAGNOSIS — G9340 Encephalopathy, unspecified: Secondary | ICD-10-CM | POA: Diagnosis not present

## 2023-01-16 DIAGNOSIS — G08 Intracranial and intraspinal phlebitis and thrombophlebitis: Secondary | ICD-10-CM | POA: Diagnosis not present

## 2023-01-16 DIAGNOSIS — E440 Moderate protein-calorie malnutrition: Secondary | ICD-10-CM | POA: Diagnosis not present

## 2023-01-16 LAB — COMPREHENSIVE METABOLIC PANEL
ALT: 15 U/L (ref 0–44)
AST: 22 U/L (ref 15–41)
Albumin: 2.4 g/dL — ABNORMAL LOW (ref 3.5–5.0)
Alkaline Phosphatase: 79 U/L (ref 38–126)
Anion gap: 8 (ref 5–15)
BUN: 6 mg/dL — ABNORMAL LOW (ref 8–23)
CO2: 24 mmol/L (ref 22–32)
Calcium: 8.4 mg/dL — ABNORMAL LOW (ref 8.9–10.3)
Chloride: 99 mmol/L (ref 98–111)
Creatinine, Ser: 0.66 mg/dL (ref 0.61–1.24)
GFR, Estimated: >60 mL/min (ref >60)
Glucose, Bld: 87 mg/dL (ref 70–99)
Potassium: 4.2 mmol/L (ref 3.5–5.1)
Sodium: 131 mmol/L — ABNORMAL LOW (ref 135–145)
Total Bilirubin: 0.9 mg/dL (ref 0.3–1.2)
Total Protein: 6.4 g/dL — ABNORMAL LOW (ref 6.5–8.1)

## 2023-01-16 LAB — CBC WITH DIFFERENTIAL/PLATELET
Abs Immature Granulocytes: 0.07 10*3/uL (ref 0.00–0.07)
Basophils Absolute: 0.1 10*3/uL (ref 0.0–0.1)
Basophils Relative: 1 %
Eosinophils Absolute: 0.2 10*3/uL (ref 0.0–0.5)
Eosinophils Relative: 2 %
HCT: 37 % — ABNORMAL LOW (ref 39.0–52.0)
Hemoglobin: 12.9 g/dL — ABNORMAL LOW (ref 13.0–17.0)
Immature Granulocytes: 1 %
Lymphocytes Relative: 22 %
Lymphs Abs: 2 10*3/uL (ref 0.7–4.0)
MCH: 39.7 pg — ABNORMAL HIGH (ref 26.0–34.0)
MCHC: 34.9 g/dL (ref 30.0–36.0)
MCV: 113.8 fL — ABNORMAL HIGH (ref 80.0–100.0)
Monocytes Absolute: 1.1 10*3/uL — ABNORMAL HIGH (ref 0.1–1.0)
Monocytes Relative: 12 %
Neutro Abs: 5.8 10*3/uL (ref 1.7–7.7)
Neutrophils Relative %: 62 %
Platelets: 433 10*3/uL — ABNORMAL HIGH (ref 150–400)
RBC: 3.25 MIL/uL — ABNORMAL LOW (ref 4.22–5.81)
RDW: 14.9 % (ref 11.5–15.5)
WBC: 9.2 10*3/uL (ref 4.0–10.5)
nRBC: 0 % (ref 0.0–0.2)

## 2023-01-16 LAB — MAGNESIUM: Magnesium: 1.8 mg/dL (ref 1.7–2.4)

## 2023-01-16 LAB — PHOSPHORUS: Phosphorus: 3.1 mg/dL (ref 2.5–4.6)

## 2023-01-16 MED ORDER — ENSURE ENLIVE PO LIQD
237.0000 mL | Freq: Three times a day (TID) | ORAL | 12 refills | Status: DC
  Filled 2023-01-16: qty 237, 1d supply, fill #0

## 2023-01-16 MED ORDER — PYRIDOXINE HCL 50 MG PO TABS
50.0000 mg | ORAL_TABLET | Freq: Every day | ORAL | 0 refills | Status: AC
  Filled 2023-01-16: qty 100, 100d supply, fill #0

## 2023-01-16 MED ORDER — PANTOPRAZOLE SODIUM 40 MG PO TBEC
40.0000 mg | DELAYED_RELEASE_TABLET | Freq: Two times a day (BID) | ORAL | 0 refills | Status: AC
  Filled 2023-01-16: qty 60, 30d supply, fill #0

## 2023-01-16 MED ORDER — POLYETHYLENE GLYCOL 3350 17 GM/SCOOP PO POWD
17.0000 g | Freq: Every day | ORAL | 0 refills | Status: DC | PRN
  Filled 2023-01-16: qty 238, 14d supply, fill #0

## 2023-01-16 MED ORDER — FOLIC ACID 1 MG PO TABS
1.0000 mg | ORAL_TABLET | Freq: Every day | ORAL | 0 refills | Status: AC
  Filled 2023-01-16: qty 30, 30d supply, fill #0

## 2023-01-16 MED ORDER — FAMOTIDINE 20 MG PO TABS
20.0000 mg | ORAL_TABLET | Freq: Every day | ORAL | 0 refills | Status: AC
  Filled 2023-01-16: qty 30, 30d supply, fill #0

## 2023-01-16 MED ORDER — ALBUTEROL SULFATE HFA 108 (90 BASE) MCG/ACT IN AERS
2.0000 | INHALATION_SPRAY | RESPIRATORY_TRACT | 0 refills | Status: DC | PRN
  Filled 2023-01-16: qty 6.7, 17d supply, fill #0

## 2023-01-16 MED ORDER — ALUM & MAG HYDROXIDE-SIMETH 400-400-40 MG/5ML PO SUSP
15.0000 mL | Freq: Four times a day (QID) | ORAL | 0 refills | Status: DC | PRN
  Filled 2023-01-16: qty 355, 6d supply, fill #0

## 2023-01-16 MED ORDER — CYANOCOBALAMIN 1000 MCG PO TABS
1000.0000 ug | ORAL_TABLET | Freq: Every day | ORAL | 0 refills | Status: AC
  Filled 2023-01-16: qty 30, 30d supply, fill #0

## 2023-01-16 MED ORDER — DOCUSATE SODIUM 100 MG PO CAPS
100.0000 mg | ORAL_CAPSULE | Freq: Two times a day (BID) | ORAL | 0 refills | Status: DC | PRN
  Filled 2023-01-16: qty 100, 50d supply, fill #0

## 2023-01-16 MED ORDER — LOSARTAN POTASSIUM 50 MG PO TABS
50.0000 mg | ORAL_TABLET | Freq: Every day | ORAL | 0 refills | Status: AC
  Filled 2023-01-16: qty 30, 30d supply, fill #0

## 2023-01-16 MED ORDER — UMECLIDINIUM-VILANTEROL 62.5-25 MCG/ACT IN AEPB
1.0000 | INHALATION_SPRAY | Freq: Every day | RESPIRATORY_TRACT | 0 refills | Status: DC
  Filled 2023-01-16: qty 60, 30d supply, fill #0

## 2023-01-16 MED ORDER — SENNOSIDES-DOCUSATE SODIUM 8.6-50 MG PO TABS
1.0000 | ORAL_TABLET | Freq: Every day | ORAL | 0 refills | Status: DC
  Filled 2023-01-16: qty 30, 30d supply, fill #0

## 2023-01-16 MED ORDER — APIXABAN 5 MG PO TABS
ORAL_TABLET | ORAL | 0 refills | Status: DC
  Filled 2023-01-16: qty 64, 30d supply, fill #0

## 2023-01-16 MED ORDER — MAGNESIUM SULFATE 2 GM/50ML IV SOLN
2.0000 g | Freq: Once | INTRAVENOUS | Status: AC
  Administered 2023-01-16: 2 g via INTRAVENOUS
  Filled 2023-01-16: qty 50

## 2023-01-16 MED ORDER — THIAMINE HCL 100 MG PO TABS
100.0000 mg | ORAL_TABLET | Freq: Every day | ORAL | 0 refills | Status: AC
  Filled 2023-01-16: qty 30, 30d supply, fill #0

## 2023-01-16 MED ORDER — ADULT MULTIVITAMIN W/MINERALS CH
1.0000 | ORAL_TABLET | Freq: Every day | ORAL | 0 refills | Status: DC
  Filled 2023-01-16: qty 130, 130d supply, fill #0

## 2023-01-16 NOTE — Discharge Summary (Signed)
Physician Discharge Summary   Patient: Isaac Rose MRN: [id number] DOB: 1961-06-05  Admit date:     01/07/2023  Discharge date: 01/16/23  Discharge Physician: Marguerita Merles, DO   PCP: Elfredia Nevins, MD   Recommendations at discharge:   Follow-up with PCP within 1 to 2 weeks and repeat CBC, CMP, mag, Phos within 1 week Follow-up with gastroenterology in outpatient setting for GERD and constipation and follow-up for weight loss evaluation Follow-up with neurology in outpatient setting and referral has been made Follow-up with hematology/oncology for hypercoagulable workup and evaluation for weight loss Follow-up with cardiology outpatient setting for extensive multivessel coronary artery calcification noted on CT scan and further workup  Discharge Diagnoses: Principal Problem:   Hyponatremia Active Problems:   Encephalopathy acute   Malnutrition of moderate degree   Dural venous sinus thrombosis   Multiple subsegmental pulmonary emboli without acute cor pulmonale (HCC)  Resolved Problems:   * No resolved hospital problems. [facility] Course: The patient is a 62 y.o. M with hx HTN, smoking, COPD who presented to the hospital after being found on the floor by wife being confused.  On admission Na+ was 115 and was found to have a dural venous sinus thrombosis and PE.    6/4: Admitted at [facility] for hyponatremia, confusion --> transferred to [facility] for 3% saline 6/5: MRI brain shows venous sinus thrombosis, heparin started 6/8: CT C/A/P obtained, shows incidental PE, no malignancy 6/9: Transferred [facility] 6/10 patient sodium level continues to be stable at 126. 6/11 patient had no nausea and vomiting yesterday, diet downgraded to clear liquid diet, abdominal x-ray does not show any signs of SBO.  Level was 127  **6/12 AM remains the same and patient is nausea vomiting is improved and he had a bowel movement.  Requesting PPI.  Will advance diet to see if he tolerates and anticipate  discharge in next 24 to 48 hours if sodium improves a little bit closer to 130  Diet was advanced and sodium improved significantly.  Patient is medically stable for discharge and will need to go home with home health therapy.  Will need to see PCP, GI, neurology as well as hematology/oncology for further workup and evaluation   Assessment and plan:  Cerebral Venous Sinus Thrombosis  -Extensive thrombosis of superior sagittal sinus and right transverse sinus extending into right internal jugular vein as well as nonocclusive thrombus in left transverse and sigmoid sinuses.   -Follow-up with neurology in outpatient setting -Etiology of this is unclear.    -CTA Head and Neck done 6/8 and showed "Unchanged appearance of extensive dural venous sinus thrombosis within the limits of arterial timed exam. 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 No intracranial large vessel occlusion or significant stenosis.Friable soft plaque in the right subclavian artery. Aortic Atherosclerosis (ICD10-I70.0) and Emphysema" -Continue Anticoagulation with Apixaban -Follow up hypercoagulable panel with Hematology, referral sent and will need to follow-up within 1 to 2 weeks    Severe Hyponatremia -Na 115 on admission, Uosm elevated and Una elevated, c/w SIADH.   -TSH, cortisol normal.   -Treated with hypertonic saline in the ICU initially.  -Improved to 130, but now trended back down to 125 mmol/L, stable today at 126 -Check urine chemistries again -Start fluid restriction -Continue to Hold home HCTZ -Fluid restriction at odds with need for increased calorie intake -Urine sodium is 72 and urine osmolality is 388. -Looks like picture of SIADH will  treat with salt tabs for 2 days starting 6/10, however patient had nausea and vomiting not sure if the patient was able to retain the Salt Tab -Na+ Trend: Recent Labs  Lab 01/10/23 0512  01/11/23 1010 01/12/23 0849 01/13/23 0523 01/14/23 0445 01/15/23 1024 01/16/23 1151  NA 128* 125* 126* 126* 127* 127* 131*  -Recommend regular diet and discharge and follow-up with PCP for repeat sodium check within 1 week  Hypomagnesemia -Patient's Mag Level Trend: Recent Labs  Lab 01/10/23 0512 01/11/23 0412 01/12/23 0154 01/13/23 0523 01/14/23 0445 01/15/23 1024 01/16/23 1151  MG 1.4* 1.6* 1.6* 1.6* 1.8 1.7 1.8  -Replete with IV Mag Sulfate 2 grams prior to discharge -Continue to Monitor and Replete as Necessary -Repeat Mag in the AM   Nausea/Vomiting, improved -Abdominal x-ray is negative for any SBO with normal bowel gas pattern -The patient however has been constipated and has had bowel movement only several days ago. -Downgraded the diet to clear liquid diet and ordered Dulcolax suppository but had a bowel movement  -Going back to a Soft Diet today with PPI BID and tolerated this well and continue at discharge  Macrocytic Anemia -Hgb/Hct Trend: Recent Labs  Lab 01/10/23 0512 01/11/23 0412 01/12/23 0154 01/13/23 0523 01/14/23 0445 01/15/23 0315 01/16/23 1151  HGB 13.4 15.2 14.0 13.8 13.1 12.8* 12.9*  HCT 36.8* 41.8 39.7 39.1 37.7* 36.4* 37.0*  MCV 110.2* 111.2* 114.1* 113.0* 112.5* 113.4* 113.8*  -Check Anemia Panel in the AM -Continue to Monitor for S/Sx of Bleeding; No overt bleeding noted -Repeat CBC within 1 week   Pulmonary Embolus Right Leg Indeterminate age DVT -Incidental findings -See above -C/w Anticoagulation and is now on apixaban   Weight loss -CT Chest, abdomen and pelvis here without obvious malignancy or metastases -CT did show "Acute pulmonary embolism. Moderate embolic burden. No CT evidence of right heart strain. Peripheral airspace infiltrate within the posterior right upper lobe and right lower lobe may reflect changes of acute infarct given the associated pulmonary embolism. Acute infection could appear similarly. Small right  parapneumonic effusion. Moderate emphysema.  Extensive multi-vessel coronary artery calcification. Minimal right nonobstructing nephrolithiasis. Aortic Atherosclerosis (ICD10-I70.0) and Emphysema" -Follow up with PCP for GI referral after discharge -Follow up with PCP for PSA  -Since following up with hematology/oncology, would recommend correlating malignancy work up with them -Nutritionist consult as below -Follow-up with GI and referral has been made and will need outpatient EGD and colonoscopy as well as following up with hematology/oncology for possible malignancy workup and hypercoagulable workup   Hypertension -BP mostly controlled -Continue Amodipine 10 mg po Daily and Losartan 50 mg po Daily -Continue to Hold home Triamterene and HCTZ and discontinue on discharge -Continue to Monitor BP per Protocol -Last BP reading was stable at 134/86   Malnutrition, moderate -As evidenced by 40 lb weight loss in 3 months, moderate loss of subcutaneous muscle mass and fat.  -Swallowing is okay.  No plan for PEG tube at this time. -Consult dietitian for supplement recommendations  Nutrition Status: Nutrition Problem: Moderate Malnutrition Etiology: social / environmental circumstances (EtOH abuse) Signs/Symptoms: moderate fat depletion, severe muscle depletion Interventions: Ensure Enlive (each supplement provides 350kcal and 20 grams of protein), MVI, Refer to RD note for recommendations, Education -Continue Ensure, increase dose and follow-up outpatient   Vitamin B12 deficiency -Continue Cyanocobalamin 1000 mcg po Daily supplement at d/c    Tobacco Abuse -Cessation recommended    Acute Metabolic Encephalopathy -At baseline, no cognitive impairment, but admitted with disorientation and  confusion, now resolved.   -Hyponatremia likely contributed is much improved -Continue to Hold Home Triamterene and HCTZ and discontinuing  GERD with Indigestion/GI Prophylaxis -Start PPI with  Pantoprazole 40 mg po BID and add Maalox while hospitalized and continue home PPI and famotidine at discharge -C/w Famotidine 20 mg po Daily   Alcohol Abuse -Continue to Monitor for Signs of Withdrawal   Hypoalbuminemia -Patient's Albumin Trend: Recent Labs  Lab 01/07/23 1621 01/08/23 0732 01/11/23 1010 01/13/23 0523 01/14/23 0445 01/15/23 1024 01/16/23 1151  ALBUMIN 2.5* 2.1* 2.2* 2.2* 2.2* 2.4* 2.4*  -Continue to Monitor and Trend and repeat CMP in the AM  Nutrition Documentation    Flowsheet Row ED to Hosp-Admission (Current) from 01/07/2023 [facility]  Nutrition Problem Moderate Malnutrition  Etiology social / environmental circumstances  [EtOH abuse]  Nutrition Goal Patient will meet greater than or equal to 90% of their needs  Interventions Ensure Enlive (each supplement provides 350kcal and 20 grams of protein), MVI, Refer to RD note for recommendations, Education      Consultants: PCCM Transfer; Nephrology, neurology Procedures performed: As Delineated as above  Disposition: Home health Diet recommendation:  Discharge Diet Orders (From admission, onward)     Start     Ordered   01/16/23 0000  Diet general        01/16/23 1353           Regular diet DISCHARGE MEDICATION: Allergies as of 01/16/2023       Reactions   Effexor [venlafaxine Hydrochloride] Other (See Comments)   unknown        Medication List     STOP taking these medications    ibuprofen 200 MG tablet Commonly known as: ADVIL   omeprazole 20 MG capsule Commonly known as: PRILOSEC       TAKE these medications    acetaminophen 500 MG tablet Commonly known as: TYLENOL Take 500-1,000 mg by mouth daily as needed for mild pain or headache.   albuterol 108 (90 Base) MCG/ACT inhaler Commonly known as: VENTOLIN HFA Inhale 2 puffs into the lungs every 4 (four) hours as needed for shortness of breath.   amLODipine 10 MG tablet Commonly known  as: NORVASC Take 10 mg by mouth daily.   Anoro Ellipta 62.5-25 MCG/ACT Aepb Generic drug: umeclidinium-vilanterol Inhale 1 puff into the lungs daily. Start taking on: January 17, 2023   CertaVite/Antioxidants Tabs Take 1 tablet by mouth daily. Start taking on: January 17, 2023   cyanocobalamin 1000 MCG tablet Commonly known as: VITAMIN B12 Take 1 tablet (1,000 mcg total) by mouth daily. Start taking on: January 17, 2023   docusate sodium 100 MG capsule Commonly known as: COLACE Take 1 capsule (100 mg total) by mouth 2 (two) times daily as needed for mild constipation.   Eliquis 5 MG Tabs tablet Generic drug: apixaban Take 2 tablets (10 mg total) by mouth 2 (two) times daily for 2 days, THEN 1 tablet (5 mg total) 2 (two) times daily thereafter Start taking on: January 17, 2023   famotidine 20 MG tablet Commonly known as: PEPCID Take 1 tablet (20 mg total) by mouth daily. Start taking on: January 17, 2023   feeding supplement Liqd Take 237 mLs by mouth 3 (three) times daily between meals.   folic acid 1 MG tablet Commonly known as: FOLVITE Take 1 tablet (1 mg total) by mouth daily. Start taking on: January 17, 2023   labetalol 300 MG tablet Commonly known as:  NORMODYNE Take 300 mg by mouth 2 (two) times daily.   losartan 50 MG tablet Commonly known as: COZAAR Take 1 tablet (50 mg total) by mouth daily. Start taking on: January 17, 2023   Mintox Maximum Strength 400-400-40 MG/5ML suspension Generic drug: alum & mag hydroxide-simeth Take 15 mLs by mouth every 6 (six) hours as needed for indigestion.   pantoprazole 40 MG tablet Commonly known as: PROTONIX Take 1 tablet (40 mg total) by mouth 2 (two) times daily.   polyethylene glycol powder 17 GM/SCOOP powder Commonly known as: GLYCOLAX/MIRALAX Take 1 capful (17 g) by mouth daily as needed for moderate constipation.   Senexon-S 8.6-50 MG tablet Generic drug: senna-docusate Take 1 tablet by mouth daily. Start taking on: January 17, 2023   thiamine 100 MG tablet Commonly known as: VITAMIN B1 Take 1 tablet (100 mg total) by mouth daily. Start taking on: January 17, 2023   Vitamin B6 50 MG Tabs Take 1 tablet (50 mg total) by mouth daily for 30 days then as directed by MD Start taking on: January 17, 2023               Durable Medical Equipment  (From admission, onward)           Start     Ordered   01/16/23 1418  For home use only DME Cane  Once        01/16/23 1417   01/16/23 1418  For home use only DME Walker rolling  Once       Question Answer Comment  Walker: With 5 Inch Wheels   Patient needs a walker to treat with the following condition Generalized weakness      01/16/23 1417            Follow-up Information      [facility]. Schedule an appointment as soon as possible for a visit in 1 month(s).   Specialty: Neurology Why: stroke clinic Contact information: [address] [place] [address] [phone]        Elfredia Nevins, MD Follow up.   Specialty: Internal Medicine Contact information: [address] [place] [address] [phone]                Discharge Exam: Filed Weights   01/08/23 0710 01/10/23 0500 01/12/23 0352  Weight: 68.3 kg 68.3 kg 64.9 kg   Vitals:   01/16/23 0754 01/16/23 1526  BP: 131/78 134/86  Pulse: 84 90  Resp: 16 17  Temp: 98.2 F (36.8 C) 98 F (36.7 C)  SpO2: 98% 98%   Examination: Physical Exam:  Constitutional: Thin Caucasian male in no acute distress appears calm Respiratory: Diminished to auscultation bilaterally, no wheezing, rales, rhonchi or crackles. Normal respiratory effort and patient is not tachypenic. No accessory muscle use.  Unlabored breathing Cardiovascular: RRR, no murmurs / rubs / gallops. S1 and S2 auscultated. No extremity edema. Abdomen: Soft, non-tender, non-distended.  Bowel sounds positive.  GU: Deferred. Musculoskeletal: No clubbing /  cyanosis of digits/nails. No joint deformity upper and lower extremities.  Skin: No rashes, lesions, ulcers on limited skin evaluation. No induration; Warm and dry.  Neurologic: CN 2-12 grossly intact with no focal deficits.  Romberg sign and cerebellar reflexes not assessed.  Psychiatric: Normal judgment and insight. Alert and oriented x 3. Normal mood and appropriate affect.   Condition at discharge: stable  The results of significant diagnostics from this hospitalization (including imaging, microbiology, ancillary and laboratory) are  listed below for reference.   Imaging Studies: DG Abd 1 View  Result Date: 01/13/2023 CLINICAL DATA:  Abdominal pain EXAM: ABDOMEN - 1 VIEW COMPARISON:  CT 01/10/2023 FINDINGS: Gas and residual contrast material throughout the colon. No large or small bowel distention. No radiopaque stones. Degenerative changes in the spine and hips. IMPRESSION: Nonobstructive bowel gas pattern with residual contrast material in the colon. Electronically Signed   By: Burman Nieves M.D.   On: 01/13/2023 18:17   VAS [place] LOWER EXTREMITY VENOUS (DVT)  Result Date: 01/12/2023  Lower Venous DVT Study Patient Name:  EPIC TRIBBETT  Date of Exam:   01/11/2023 Medical Rec #: [id number]          Accession #:    [id number] Date of Birth: 1961/03/30          Patient Gender: M Patient Age:   69 years Exam Location:  [facility] Procedure:      VAS [place] LOWER EXTREMITY VENOUS (DVT) Referring Phys: Scheryl Marten XU --------------------------------------------------------------------------------  Other Indications: Dural venous sinus thrombosis, right IJV thrombus. Risk Factors: Confirmed PE ETOH withdrawal. Comparison Study: No prior study on file Performing Technologist: Sherren Kerns RVS  Examination Guidelines: A complete evaluation includes B-mode imaging, spectral Doppler, color Doppler, and power Doppler as needed of all accessible portions of each vessel. Bilateral testing is considered an  integral part of a complete examination. Limited examinations for reoccurring indications may be performed as noted. The reflux portion of the exam is performed with the patient in reverse Trendelenburg.  +---------+---------------+---------+-----------+----------+-------------------+ RIGHT    CompressibilityPhasicitySpontaneityPropertiesThrombus Aging      +---------+---------------+---------+-----------+----------+-------------------+ CFV      Full           Yes      No                                       +---------+---------------+---------+-----------+----------+-------------------+ SFJ      Full                                                             +---------+---------------+---------+-----------+----------+-------------------+ FV Prox  Full           No       No                                       +---------+---------------+---------+-----------+----------+-------------------+ FV Mid   Full           No       No                                       +---------+---------------+---------+-----------+----------+-------------------+ FV DistalNone           No       No                   Age Indeterminate   +---------+---------------+---------+-----------+----------+-------------------+ PFV      Full           Yes      Yes                                      +---------+---------------+---------+-----------+----------+-------------------+  POP      None           No       No                   Age Indeterminate   +---------+---------------+---------+-----------+----------+-------------------+ PTV      None           No       No                   Age Indeterminate   +---------+---------------+---------+-----------+----------+-------------------+ PERO                                                  Not well visualized +---------+---------------+---------+-----------+----------+-------------------+ Gastroc  None           No       No                    Age Indeterminate   +---------+---------------+---------+-----------+----------+-------------------+   +---------+---------------+---------+-----------+----------+--------------+ LEFT     CompressibilityPhasicitySpontaneityPropertiesThrombus Aging +---------+---------------+---------+-----------+----------+--------------+ CFV      Full           Yes      Yes                                 +---------+---------------+---------+-----------+----------+--------------+ SFJ      Full                                                        +---------+---------------+---------+-----------+----------+--------------+ FV Prox  Full           Yes      Yes                                 +---------+---------------+---------+-----------+----------+--------------+ FV Mid   Full                                                        +---------+---------------+---------+-----------+----------+--------------+ FV DistalFull                                                        +---------+---------------+---------+-----------+----------+--------------+ PFV      Full           Yes      Yes                                 +---------+---------------+---------+-----------+----------+--------------+ POP      Full           Yes      Yes                                 +---------+---------------+---------+-----------+----------+--------------+  PTV      Full                                                        +---------+---------------+---------+-----------+----------+--------------+ PERO     Full                                                        +---------+---------------+---------+-----------+----------+--------------+     Summary: RIGHT: - Findings consistent with age indeterminate deep vein thrombosis involving the right femoral vein, right popliteal vein, right posterior tibial veins, and right gastrocnemius veins. - No cystic structure found in  the popliteal fossa.  LEFT: - No evidence of deep vein thrombosis in the lower extremity. No indirect evidence of obstruction proximal to the inguinal ligament. - No cystic structure found in the popliteal fossa.  *See table(s) above for measurements and observations. Electronically signed by Gerarda Fraction on 01/12/2023 at 3:55:39 PM.    Final    CT ANGIO HEAD NECK W WO CM  Result Date: 01/11/2023 CLINICAL DATA:  venous sinus thrombosis. EXAM: CT ANGIOGRAPHY HEAD AND NECK WITH AND WITHOUT CONTRAST TECHNIQUE: Multidetector CT imaging of the head and neck was performed using the standard protocol during bolus administration of intravenous contrast. Multiplanar CT image reconstructions and MIPs were obtained to evaluate the vascular anatomy. Carotid stenosis measurements (when applicable) are obtained utilizing NASCET criteria, using the distal internal carotid diameter as the denominator. RADIATION DOSE REDUCTION: This exam was performed according to the departmental dose-optimization program which includes automated exposure control, adjustment of the mA and/or kV according to patient size and/or use of iterative reconstruction technique. CONTRAST:  75mL OMNIPAQUE IOHEXOL 350 MG/ML SOLN COMPARISON:  MRI brain 01/08/2023.  Head CT 01/07/2023. FINDINGS: CT HEAD FINDINGS Brain: No acute hemorrhage. Unchanged moderate chronic small-vessel disease. Cortical gray-white differentiation is otherwise preserved. Mildly age advanced volume loss. No hydrocephalus or extra-axial collection. No mass effect or midline shift. Vascular: Persistent hyperdensity of the superior sagittal sinus, right transverse and sigmoid sinuses. Skull: No calvarial fracture or suspicious bone lesion. Skull base is unremarkable. Sinuses/Orbits: Unremarkable. Other: None. Review of the MIP images confirms the above findings CTA NECK FINDINGS Aortic arch: Three-vessel arch configuration. Atherosclerotic vascular changes of the aortic arch and arch  vessels. Moderate eccentric soft plaque in the left subclavian artery without hemodynamically significant stenosis. Friable soft plaque in the right subclavian artery (image 80 series 9). Right carotid system: No evidence of dissection, stenosis (50% or greater), or occlusion. Mild calcified plaque at the right carotid bulb. Left carotid system: No evidence of dissection, stenosis (50% or greater), or occlusion. Mild calcified plaque at the left carotid bulb. 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 The left vertebral artery is patent without stenosis or evidence of dissection. Skeleton: Mild cervical spondylosis without high-grade spinal canal stenosis. No suspicious bone lesions. Other neck: Unremarkable. Upper chest: Moderate centrilobular emphysema in the lung apices. Review of the MIP images confirms the above findings CTA HEAD FINDINGS Anterior circulation: Calcified plaque along the carotid siphons without hemodynamically significant stenosis. The proximal ACAs and MCAs are patent  without stenosis or aneurysm. Distal branches are symmetric. Posterior circulation: Normal basilar artery. The SCAs, AICAs and PICAs are patent proximally. The PCAs are patent proximally without stenosis or aneurysm. Distal branches are symmetric. Venous sinuses: Within the limits of early phase of contrast, unchanged appearance of extensive dural venous sinus thrombosis throughout the superior sagittal sinus and right-greater-than-left transverse and sigmoid sinuses. Anatomic variants: None. Review of the MIP images confirms the above findings IMPRESSION: 1. Unchanged appearance of extensive dural venous sinus thrombosis within the limits of arterial timed exam. 2. 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 3. No intracranial large vessel occlusion or significant  stenosis. 4. Friable soft plaque in the right subclavian artery. Aortic Atherosclerosis (ICD10-I70.0) and Emphysema (ICD10-J43.9). Electronically Signed   By: Orvan Falconer M.D.   On: 01/11/2023 15:43   CT CHEST ABDOMEN PELVIS W CONTRAST  Addendum Date: 01/10/2023   ADDENDUM REPORT: 01/10/2023 20:30 ADDENDUM: These results were called by telephone at the time of interpretation on 01/10/2023 at 7:44 Pm to provider Cloyd Stagers, PA, who verbally acknowledged these results. Electronically Signed   By: Helyn Numbers M.D.   On: 01/10/2023 20:30   Result Date: 01/10/2023 CLINICAL DATA:  Occult malignancy EXAM: CT CHEST, ABDOMEN, AND PELVIS WITH CONTRAST TECHNIQUE: Multidetector CT imaging of the chest, abdomen and pelvis was performed following the standard protocol during bolus administration of intravenous contrast. RADIATION DOSE REDUCTION: This exam was performed according to the departmental dose-optimization program which includes automated exposure control, adjustment of the mA and/or kV according to patient size and/or use of iterative reconstruction technique. CONTRAST:  75mL OMNIPAQUE IOHEXOL 350 MG/ML SOLN COMPARISON:  None Available. FINDINGS: CT CHEST FINDINGS Cardiovascular: Extensive multi-vessel coronary artery calcification. Global cardiac size within normal limits. No pericardial effusion. The central pulmonary arteries are of normal caliber. There is incidentally noted intraluminal filling defect within the right main pulmonary artery peripherally extending into multiple segmental pulmonary arteries of the right middle and lower lobes in keeping with acute pulmonary embolism. The embolic burden is moderate. No CT evidence of right heart strain. Moderate atherosclerotic calcification within the thoracic aorta proximally. Extensive smooth atheromatous plaque within the descending thoracic and proximal abdominal aorta. Left subclavian central venous catheter tip noted at the superior vena caval  confluence. Mediastinum/Nodes: No enlarged mediastinal, hilar, or axillary lymph nodes. Thyroid gland, trachea, and esophagus demonstrate no significant findings. Lungs/Pleura: Moderate emphysema. Peripheral airspace infiltrate within the posterior right upper lobe and right lower lobe may reflect changes of acute infarct given the associated pulmonary embolism. Acute infection could appear similarly. Small right parapneumonic effusion. Trace left effusion. No pneumothorax. No central obstructing lesion. Musculoskeletal: No acute bone abnormality. No lytic or blastic bone lesion. CT ABDOMEN PELVIS FINDINGS Hepatobiliary: Tiny hypodensities within the left hepatic lobe are too small to accurately characterize. Liver otherwise unremarkable. Gallbladder unremarkable. No intra or extrahepatic biliary ductal dilation. Pancreas: Unremarkable Spleen: Unremarkable Adrenals/Urinary Tract: The adrenal glands are unremarkable. The kidneys are normal in size and position. Probable simple cortical cyst within the lower pole the right kidney for which no follow-up imaging is recommended. 2 mm nonobstructing calculus within the interpolar region of the right kidney. No hydronephrosis. No ureteral calculi. Bladder unremarkable. Stomach/Bowel: Nasoenteric feeding tube tip noted within the pylorus. Stomach, small bowel, and large bowel are otherwise unremarkable. Appendix absent. No free intraperitoneal gas or fluid. Vascular/Lymphatic: Extensive aortoiliac atherosclerotic calcification. No aortic aneurysm. No pathologic adenopathy within the abdomen  and pelvis. Reproductive: Prostate is unremarkable. Other: Tiny fat containing umbilical hernia. Musculoskeletal: Degenerative changes are seen within the lumbar spine. No lytic or blastic bone lesion. No acute bone abnormality. IMPRESSION: 1. Acute pulmonary embolism. Moderate embolic burden. No CT evidence of right heart strain. 2. Peripheral airspace infiltrate within the posterior  right upper lobe and right lower lobe may reflect changes of acute infarct given the associated pulmonary embolism. Acute infection could appear similarly. Small right parapneumonic effusion. 3. Moderate emphysema. 4. Extensive multi-vessel coronary artery calcification. 5. Minimal right nonobstructing nephrolithiasis. Aortic Atherosclerosis (ICD10-I70.0) and Emphysema (ICD10-J43.9). Electronically Signed: By: Helyn Numbers M.D. On: 01/10/2023 19:16   ECHOCARDIOGRAM COMPLETE  Result Date: 01/10/2023    ECHOCARDIOGRAM REPORT   Patient Name:   WILLILAM ROMM Date of Exam: 01/10/2023 Medical Rec #:  [id number]         Height:       70.0 in Accession #:    [id number]        Weight:       150.6 lb Date of Birth:  1960-12-02         BSA:          1.850 m Patient Age:    61 years          BP:           133/71 mmHg Patient Gender: M                 HR:           81 bpm. Exam Location:  Inpatient Procedure: 2D Echo, Cardiac Doppler and Color Doppler Indications:    Stroke  History:        Patient has no prior history of Echocardiogram examinations.                 COPD; Risk Factors:Hypertension and Current Smoker.  Sonographer:    Lucy Antigua Referring Phys: [id number] PRAMOD S SETHI  Sonographer Comments: Suboptimal parasternal window. Image acquisition challenging due to COPD and Image acquisition challenging due to respiratory motion. IMPRESSIONS  1. Left ventricular ejection fraction, by estimation, is 60 to 65%. The left ventricle has normal function. The left ventricle has no regional wall motion abnormalities. Left ventricular diastolic parameters were normal.  2. Right ventricular systolic function is normal. The right ventricular size is normal.  3. The mitral valve is normal in structure. Trivial mitral valve regurgitation. No evidence of mitral stenosis.  4. The aortic valve is normal in structure. Aortic valve regurgitation is not visualized. No aortic stenosis is present.  5. The inferior vena cava is normal in  size with greater than 50% respiratory variability, suggesting right atrial pressure of 3 mmHg. FINDINGS  Left Ventricle: Left ventricular ejection fraction, by estimation, is 60 to 65%. The left ventricle has normal function. The left ventricle has no regional wall motion abnormalities. The left ventricular internal cavity size was normal in size. There is  no left ventricular hypertrophy. Left ventricular diastolic parameters were normal. Right Ventricle: The right ventricular size is normal. No increase in right ventricular wall thickness. Right ventricular systolic function is normal. Left Atrium: Left atrial size was normal in size. Right Atrium: Right atrial size was normal in size. Pericardium: There is no evidence of pericardial effusion. Mitral Valve: The mitral valve is normal in structure. Trivial mitral valve regurgitation. No evidence of mitral valve stenosis. Tricuspid Valve: The tricuspid valve is normal in structure. Tricuspid valve regurgitation is trivial. No evidence  of tricuspid stenosis. Aortic Valve: The aortic valve is normal in structure. Aortic valve regurgitation is not visualized. No aortic stenosis is present. Aortic valve mean gradient measures 6.0 mmHg. Aortic valve peak gradient measures 9.4 mmHg. Aortic valve area, by VTI measures 2.42 cm. Pulmonic Valve: The pulmonic valve was not well visualized. Pulmonic valve regurgitation is not visualized. No evidence of pulmonic stenosis. Aorta: The aortic root is normal in size and structure. Venous: The inferior vena cava is normal in size with greater than 50% respiratory variability, suggesting right atrial pressure of 3 mmHg. IAS/Shunts: No atrial level shunt detected by color flow Doppler.  LEFT VENTRICLE PLAX 2D LVOT diam:     1.90 cm   Diastology LV SV:         69        LV e' medial:    9.46 cm/s LV SV Index:   37        LV E/e' medial:  8.3 LVOT Area:     2.84 cm  LV e' lateral:   12.90 cm/s                          LV E/e' lateral:  6.1  RIGHT VENTRICLE RV S prime:     19.80 cm/s TAPSE (M-mode): 2.2 cm LEFT ATRIUM           Index        RIGHT ATRIUM           Index LA Vol (A4C): 44.5 ml 24.05 ml/m  RA Area:     17.75 cm                                    RA Volume:   49.90 ml  26.97 ml/m  AORTIC VALVE AV Area (Vmax):    2.33 cm AV Area (Vmean):   2.16 cm AV Area (VTI):     2.42 cm AV Vmax:           153.00 cm/s AV Vmean:          111.000 cm/s AV VTI:            0.284 m AV Peak Grad:      9.4 mmHg AV Mean Grad:      6.0 mmHg LVOT Vmax:         126.00 cm/s LVOT Vmean:        84.700 cm/s LVOT VTI:          0.242 m LVOT/AV VTI ratio: 0.85  AORTA Ao Root diam: 3.50 cm Ao Asc diam:  3.60 cm MITRAL VALVE MV Area (PHT): 2.29 cm    SHUNTS MV Decel Time: 331 msec    Systemic VTI:  0.24 m MV E velocity: 78.60 cm/s  Systemic Diam: 1.90 cm MV A velocity: 55.00 cm/s MV E/A ratio:  1.43 Arvilla Meres MD Electronically signed by Arvilla Meres MD Signature Date/Time: 01/10/2023/12:18:09 PM    Final    EEG adult  Result Date: 01/09/2023 Charlsie Quest, MD     01/09/2023  5:12 PM Patient Name: JENCARLO ALESSANDRINI MRN: [id number] Epilepsy Attending: Charlsie Quest Referring Physician/Provider: Erick Blinks, MD Date: 01/09/2023 Duration: 27.42 mins Patient history: 62yo M with Hx of etoh use, presenting with confusion, vomiting, r sided abrasions, 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 EEG to  evaluate for seizure. Level of alertness: Awake, asleep AEDs during EEG study: None Technical aspects: This EEG study was done with scalp electrodes positioned according to the 10-20 International system of electrode placement. Electrical activity was reviewed with band pass filter of 1-70Hz , sensitivity of 7 uV/mm, display speed of 44mm/sec with a 60Hz  notched filter applied as appropriate. EEG data were recorded continuously and digitally stored.  Video monitoring was available and reviewed as appropriate.  Description: No posterior dominant rhythm was seen. Sleep was characterized by vertex waves, sleep spindles (12 to 14 Hz), maximal frontocentral region. EEG showed continuous generalized 3 to 6 Hz theta-delta slowing.  Hyperventilation and photic stimulation were not performed.   ABNORMALITY - Continuous slow, generalized IMPRESSION: This study is suggestive of moderate diffuse encephalopathy, nonspecific etiology. No seizures or epileptiform discharges were seen throughout the recording. If suspicion for interictal activity remains a concern, a prolonged study can be considered. Priyanka Annabelle Harman   DG CHEST PORT 1 VIEW  Result Date: 01/08/2023 CLINICAL DATA:  Central line placement EXAM: PORTABLE CHEST 1 VIEW COMPARISON:  01/07/2023 FINDINGS: There is interval placement of left subclavian central venous catheter with its tip at the junction of innominate vein and superior vena cava. NG tube is noted traversing the esophagus. Cardiac size is within normal limits. There are no signs of pulmonary edema or focal pulmonary consolidation. There is no evidence of any large pleural effusion or pneumothorax in this supine radiograph. IMPRESSION: Interval placement of left subclavian central venous catheter with its tip at the junction of superior vena cava. There is no evidence of any significant pneumothorax in this AP supine portable view. Electronically Signed   By: Ernie Avena M.D.   On: 01/08/2023 18:04   MR CERVICAL SPINE W WO CONTRAST  Result Date: 01/08/2023 CLINICAL DATA:  Myelopathy, chronic, cervical spine. Altered mental status. EXAM: MRI CERVICAL SPINE WITHOUT AND WITH CONTRAST TECHNIQUE: Multiplanar and multiecho pulse sequences of the cervical spine, to include the craniocervical junction and cervicothoracic junction, were obtained without and with intravenous contrast. CONTRAST:  7mL GADAVIST GADOBUTROL 1 MMOL/ML IV SOLN COMPARISON:  CT yesterday. FINDINGS: Study suffers from motion degradation.  Alignment: No significant malalignment. Vertebrae: No focal bone lesion. Cord: No cord compression or focal cord lesion. Posterior Fossa, vertebral arteries, paraspinal tissues: Intracranial venous sinus thrombosis as described on the brain examination. Disc levels: Mild spondylosis from C2-3 through C5-6. No compressive stenosis of the canal. Foraminal narrowing at C5-6 right more than left could affect in particular the right C6 nerve. IMPRESSION: 1. Motion degraded study. No cord compression or focal cord lesion. 2. Degenerative spondylosis from C2-3 through C5-6. Foraminal narrowing at C5-6 right more than left could affect in particular the right C6 nerve. 3. 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 Electronically Signed   By: Paulina Fusi M.D.   On: 01/08/2023 17:23   MR BRAIN W WO CONTRAST  Result Date: 01/08/2023 CLINICAL DATA:  mental status change of unknown cause EXAM: MRI HEAD WITHOUT AND WITH CONTRAST TECHNIQUE: Multiplanar, multiecho pulse sequences of the brain and surrounding structures were obtained without and with intravenous contrast. CONTRAST:  7mL GADAVIST GADOBUTROL 1 MMOL/ML IV SOLN COMPARISON:  Head CT yesterday FINDINGS: Brain: Diffusion imaging does not show any acute or subacute infarction. No focal abnormality affects the brainstem or cerebellum. Cerebral hemispheres show chronic small-vessel ischemic changes of the deep white matter and an old right parietal cortical and subcortical infarction. No mass, hemorrhage, hydrocephalus or  extra-axial collection. This examination does confirm the presence of thrombosis of the superior sagittal sinus, right transverse sinus, extending through to the proximal right internal jugular vein. There may be partial thrombosis of the left transverse and sigmoid sinus. There is thrombosis of some superficial cortical veins as well in the parietal regions. Vascular: See above regarding venous thrombosis. Arterial  structures show flow with the exception of the right vertebral artery. Skull and upper cervical spine: Negative Sinuses/Orbits: Clear/normal Other: None IMPRESSION: 1. No evidence of acute infarction. Chronic small-vessel ischemic changes of the cerebral hemispheric white matter. 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 2. 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 Lesser, nonocclusive thrombus within the left transverse and sigmoid venous sinuses. Some superficial venous thrombosis over the parietal convexities as well. Electronically Signed   By: Paulina Fusi M.D.   On: 01/08/2023 17:21   DG Abd Portable 1V  Result Date: 01/08/2023 CLINICAL DATA:  Feeding tube placement. EXAM: PORTABLE ABDOMEN - 1 VIEW COMPARISON:  None Available. FINDINGS: Tip of the weighted enteric tube is below the diaphragm in the midline in the region of the distal stomach. No small bowel distention in the upper abdomen. IMPRESSION: Tip of the weighted enteric tube in the region of the distal stomach. Electronically Signed   By: Narda Rutherford M.D.   On: 01/08/2023 09:59   CT Head Wo Contrast  Result Date: 01/07/2023 CLINICAL DATA:  Mental status change, unknown cause; Neck trauma, dangerous injury mechanism (Age 29-64y) EXAM: CT HEAD WITHOUT CONTRAST CT CERVICAL SPINE WITHOUT CONTRAST TECHNIQUE: Multidetector CT imaging of the head and cervical spine was performed following the standard protocol without intravenous contrast. Multiplanar CT image reconstructions of the cervical spine were also generated. RADIATION DOSE REDUCTION: This exam was performed according to the departmental dose-optimization program which includes automated exposure control, adjustment of the mA and/or kV according to patient size and/or use of iterative reconstruction technique. COMPARISON:  CT head January 05, 2015. FINDINGS: CT HEAD FINDINGS Brain: No  evidence of acute large vascular territory infarct, mass lesion, midline shift or definite acute hemorrhage. Vascular: Relatively dense appearance of the dural venous sinuses, particularly of the right transverse/sigmoid sinus. Skull: No acute fracture. Sinuses/Orbits: Clear sinuses.  No acute orbital findings. CT CERVICAL SPINE FINDINGS Alignment: No evidence of substantial sagittal subluxation. Skull base and vertebrae: Vertebral body heights are maintained. Soft tissues and spinal canal: No prevertebral fluid or swelling. No visible canal hematoma. Disc levels: Multilevel degenerative disease including disc bulges and endplate spurring. Multilevel facet and uncovertebral hypertrophy with varying degrees of neural foraminal stenosis, greatest and likely severe on the right at C5-C6. Upper chest: Emphysema.  Visualized lung apices are clear. IMPRESSION: 1. Relatively dense appearance of the dural venous sinuses, particularly of the right transverse/sigmoid sinus. This could be artifactual, but recommend MRI head with contrast to exclude dural venous sinus thrombosis. 2. No evidence of acute fracture or traumatic malalignment in the cervical spine. 3. 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 MRI of the cervical spine could further evaluate if clinically warranted. Electronically Signed   By: Feliberto Harts M.D.   On: 01/07/2023 17:10   CT Cervical Spine Wo Contrast  Result Date: 01/07/2023 CLINICAL DATA:  Mental status change, unknown cause; Neck trauma, dangerous injury mechanism (Age 24-64y) EXAM: CT HEAD WITHOUT CONTRAST CT CERVICAL SPINE WITHOUT CONTRAST TECHNIQUE: Multidetector CT imaging of the head and cervical spine was performed following  the standard protocol without intravenous contrast. Multiplanar CT image reconstructions of the cervical spine were also generated. RADIATION DOSE REDUCTION: This exam was performed according to the departmental dose-optimization program which includes  automated exposure control, adjustment of the mA and/or kV according to patient size and/or use of iterative reconstruction technique. COMPARISON:  CT head January 05, 2015. FINDINGS: CT HEAD FINDINGS Brain: No evidence of acute large vascular territory infarct, mass lesion, midline shift or definite acute hemorrhage. Vascular: Relatively dense appearance of the dural venous sinuses, particularly of the right transverse/sigmoid sinus. Skull: No acute fracture. Sinuses/Orbits: Clear sinuses.  No acute orbital findings. CT CERVICAL SPINE FINDINGS Alignment: No evidence of substantial sagittal subluxation. Skull base and vertebrae: Vertebral body heights are maintained. Soft tissues and spinal canal: No prevertebral fluid or swelling. No visible canal hematoma. Disc levels: Multilevel degenerative disease including disc bulges and endplate spurring. Multilevel facet and uncovertebral hypertrophy with varying degrees of neural foraminal stenosis, greatest and likely severe on the right at C5-C6. Upper chest: Emphysema.  Visualized lung apices are clear. IMPRESSION: 1. Relatively dense appearance of the dural venous sinuses, particularly of the right transverse/sigmoid sinus. This could be artifactual, but recommend MRI head with contrast to exclude dural venous sinus thrombosis. 2. No evidence of acute fracture or traumatic malalignment in the cervical spine. 3. 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 MRI of the cervical spine could further evaluate if clinically warranted. Electronically Signed   By: Feliberto Harts M.D.   On: 01/07/2023 17:10   DG Chest Portable 1 View  Result Date: 01/07/2023 CLINICAL DATA:  Altered mental status EXAM: PORTABLE CHEST 1 VIEW COMPARISON:  X-ray 09/21/2018 and older FINDINGS: The left inferior costophrenic angle is clipped off the edge of the film. No consolidation, pneumothorax or effusion. No edema. Normal cardiopericardial silhouette. Calcified aorta. Overlapping cardiac  leads. IMPRESSION: No acute cardiopulmonary disease Electronically Signed   By: Karen Kays M.D.   On: 01/07/2023 17:07    Microbiology: Results for orders placed or performed during the hospital encounter of 01/07/23  Urine Culture     Status: None   Collection Time: 01/07/23  4:35 PM   Specimen: Urine, Catheterized  Result Value Ref Range Status   Specimen Description   Final    URINE, CATHETERIZED Performed at [facility], [address] [place], [place] [address]    Special Requests   Final    NONE Reflexed from [id number] Performed at [facility] [address], [place], [place] [address]    Culture   Final    NO GROWTH Performed at [facility], [address], [place], [place] [address]    Report Status 01/08/2023 FINAL  Final  MRSA Next Gen by PCR, Nasal     Status: None   Collection Time: 01/07/23  8:01 PM   Specimen: Nasal Mucosa; Nasal Swab  Result Value Ref Range Status   MRSA by PCR Next Gen NOT DETECTED NOT DETECTED Final    Comment: (NOTE) The GeneXpert MRSA Assay (FDA approved for NASAL specimens only), is one component of a comprehensive MRSA colonization surveillance program. It is not intended to diagnose MRSA infection nor to guide or monitor treatment for MRSA infections. Test performance is not FDA approved in patients less than 60 years old. Performed at [facility], [address], [place], [place] [address]    Labs: CBC: Recent Labs  Lab 01/12/23 0154 01/13/23 3086 01/14/23 0445 01/15/23 0315 01/16/23 1151  WBC 8.1 9.5 8.3 8.4 9.2  NEUTROABS  --   --  5.1  --  5.8  HGB 14.0 13.8 13.1 12.8* 12.9*  HCT 39.7 39.1 37.7* 36.4* 37.0*  MCV 114.1* 113.0* 112.5* 113.4* 113.8*  PLT 213 311 367 381 433*   Basic Metabolic Panel: Recent Labs  Lab 01/11/23 0412 01/11/23 1010 01/12/23 0154 01/12/23 0849 01/13/23 0523 01/14/23 0445 01/15/23 1024 01/16/23 1151  NA  --    < >  --  126* 126* 127* 127* 131*  K  --    < >  --  4.5  5.0 4.2 4.2 4.2  CL  --    < >  --  90* 90* 91* 94* 99  CO2  --    < >  --  26 27 27 24 24   GLUCOSE  --    < >  --  105* 95 91 80 87  BUN  --    < >  --  11 9 12 10  6*  CREATININE  --    < >  --  0.56* 0.70 0.70 0.59* 0.66  CALCIUM  --    < >  --  9.1 9.1 8.9 8.5* 8.4*  MG 1.6*  --  1.6*  --  1.6* 1.8 1.7 1.8  PHOS 3.4  --  5.8*  --  5.4*  --  3.4 3.1   < > = values in this interval not displayed.   Liver Function Tests: Recent Labs  Lab 01/11/23 1010 01/13/23 0523 01/14/23 0445 01/15/23 1024 01/16/23 1151  AST 25 24 23 24 22   ALT 14 14 14 15 15   ALKPHOS 96 75 76 78 79  BILITOT 1.2 1.2 1.1 1.0 0.9  PROT 6.3* 6.4* 6.6 6.5 6.4*  ALBUMIN 2.2* 2.2* 2.2* 2.4* 2.4*   CBG: Recent Labs  Lab 01/12/23 1204 01/12/23 1654 01/13/23 0754 01/13/23 1214 01/13/23 1605  GLUCAP 127* 105* 101* 99 141*   Discharge time spent: greater than 30 minutes.  Signed: Marguerita Merles, DO [facility] 01/16/2023

## 2023-01-16 NOTE — Plan of Care (Signed)
  Problem: Education: Goal: Knowledge of General Education information will improve Description: Including pain rating scale, medication(s)/side effects and non-pharmacologic comfort measures Outcome: Adequate for Discharge   Problem: Health Behavior/Discharge Planning: Goal: Ability to manage health-related needs will improve Outcome: Adequate for Discharge   Problem: Clinical Measurements: Goal: Ability to maintain clinical measurements within normal limits will improve Outcome: Adequate for Discharge Goal: Will remain free from infection Outcome: Adequate for Discharge Goal: Diagnostic test results will improve Outcome: Adequate for Discharge Goal: Respiratory complications will improve Outcome: Adequate for Discharge Goal: Cardiovascular complication will be avoided Outcome: Adequate for Discharge   Problem: Activity: Goal: Risk for activity intolerance will decrease Outcome: Adequate for Discharge   Problem: Nutrition: Goal: Adequate nutrition will be maintained Outcome: Adequate for Discharge   Problem: Coping: Goal: Level of anxiety will decrease Outcome: Adequate for Discharge   Problem: Elimination: Goal: Will not experience complications related to bowel motility Outcome: Adequate for Discharge Goal: Will not experience complications related to urinary retention Outcome: Adequate for Discharge   Problem: Pain Managment: Goal: General experience of comfort will improve Outcome: Adequate for Discharge   Problem: Safety: Goal: Ability to remain free from injury will improve Outcome: Adequate for Discharge   Problem: Skin Integrity: Goal: Risk for impaired skin integrity will decrease Outcome: Adequate for Discharge   Problem: Safety: Goal: Non-violent Restraint(s) Outcome: Adequate for Discharge   Problem: Malnutrition  (NI-5.2) Goal: Food and/or nutrient delivery Description: Individualized approach for food/nutrient provision. Outcome: Adequate for  Discharge   Problem: Acute Rehab PT Goals(only PT should resolve) Goal: Pt Will Go Supine/Side To Sit Outcome: Adequate for Discharge Goal: Patient Will Transfer Sit To/From Stand Outcome: Adequate for Discharge Goal: Pt Will Transfer Bed To Chair/Chair To Bed Outcome: Adequate for Discharge Goal: Pt Will Ambulate Outcome: Adequate for Discharge   Problem: Acute Rehab OT Goals (only OT should resolve) Goal: Pt. Will Perform Grooming Outcome: Adequate for Discharge Goal: Pt. Will Perform Lower Body Dressing Outcome: Adequate for Discharge Goal: Pt. Will Transfer To Toilet Outcome: Adequate for Discharge Goal: Pt. Will Perform Tub/Shower Transfer Outcome: Adequate for Discharge

## 2023-01-16 NOTE — Progress Notes (Signed)
Physical Therapy Treatment Patient Details Name: Isaac Rose MRN: [id number] DOB: [birth year] Today's Date: 01/16/2023   History of Present Illness 62 year old male admitted with  with acute encephalopathy and muttering confusion with mild agitation. 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 Lesser, nonocclusive thrombus within the left transverse and  sigmoid venous sinuses. Some superficial venous thrombosis over the  parietal convexities as well; heparin started 6/5;  No evidence of acute stroke; 6/7 Acute pulmonary embolism. Moderate embolic burden. No CT evidence  of right heart strain;  has a past medical history of Chronic constipation, COPD (chronic obstructive pulmonary disease) (HCC), Depression, GERD (gastroesophageal reflux disease), Headache, and Hypertension.    PT Comments    Pt received sitting EOB and agreeable to session. Pt standing from EOB and demonstrating increased instability and reaching for footboard of bed for support. Pt requiring UE support for all mobility tasks due to impaired balance and weakness. Pt able to perform gait trial with [facility] for balance, however demonstrating some instability and drifting with one LOB requiring assist to correct. Pt with B knees flexed in stance and is able to briefly correct with cues, however pt reporting BLE feeling weak. Pt able to perform stair trial with min guard for safety and cues for pacing. Education provided on activity progression, exercise at home, and use of [facility] for balance. Pt continues to benefit from PT services to progress toward functional mobility goals.     Recommendations for follow up therapy are one component of a multi-disciplinary discharge planning process, led by the attending physician.  Recommendations may be updated based on patient status, additional functional criteria and insurance  authorization.     Assistance Recommended at Discharge Intermittent Supervision/Assistance  Patient can return home with the following A little help with walking and/or transfers;Assistance with cooking/housework;Assist for transportation   Equipment Recommendations       Recommendations for Other Services       Precautions / Restrictions Precautions Precautions: Fall Restrictions Weight Bearing Restrictions: No     Mobility  Bed Mobility               General bed mobility comments: Pt sitting EOB at beginning and end of session    Transfers Overall transfer level: Needs assistance Equipment used: None Transfers: Sit to/from Stand Sit to Stand: Min guard           General transfer comment: From EOB x2 with close min guard due to unsteadiness. Pt immediately reaching for footboard for support.    Ambulation/Gait Ambulation/Gait assistance: Min guard Gait Distance (Feet): 220 Feet Assistive device: Straight cane Gait Pattern/deviations: Step-through pattern, Decreased step length - right, Decreased step length - left, Drifts right/left, Knee flexed in stance - left, Knee flexed in stance - right       General Gait Details: Slow gait with unsteadiness with SPC support. Pt with one LOB requiring min A for steadying   Stairs Stairs: Yes Stairs assistance: Min guard Stair Management: One rail Left, With cane Number of Stairs: 6 General stair comments: Pt switching between alternating pattern and step-to. Cues for pacing and use of cane for increased support.        Balance Overall balance assessment: Needs assistance Sitting-balance support: No upper extremity supported, Feet supported Sitting balance-Leahy Scale: Good Sitting balance - Comments: sitting EOB   Standing balance support: Single extremity supported, During  functional activity Standing balance-Leahy Scale: Fair Standing balance comment: with cane support demonstrating some unsteadiness  and drifting                            Cognition Arousal/Alertness: Awake/alert Behavior During Therapy: WFL for tasks assessed/performed Overall Cognitive Status: Within Functional Limits for tasks assessed                                 General Comments: Pt reporting some confusion intermittently, stating that he called his ex-wife at 2am last night thinking it was 2pm.        Exercises      General Comments        Pertinent Vitals/Pain Pain Assessment Pain Assessment: No/denies pain     PT Goals (current goals can now be found in the care plan section) Acute Rehab PT Goals Patient Stated Goal: Did not state; agreeable to OOB PT Goal Formulation: With patient Time For Goal Achievement: 01/26/23 Potential to Achieve Goals: Good Progress towards PT goals: Progressing toward goals    Frequency    Min 4X/week      PT Plan Current plan remains appropriate       AM-PAC PT "6 Clicks" Mobility   Outcome Measure  Help needed turning from your back to your side while in a flat bed without using bedrails?: None Help needed moving from lying on your back to sitting on the side of a flat bed without using bedrails?: None Help needed moving to and from a bed to a chair (including a wheelchair)?: A Little Help needed standing up from a chair using your arms (e.g., wheelchair or bedside chair)?: A Little Help needed to walk in hospital room?: A Little Help needed climbing 3-5 steps with a railing? : A Little 6 Click Score: 20    End of Session Equipment Utilized During Treatment: Gait belt Activity Tolerance: Patient tolerated treatment well Patient left: in bed;with nursing/sitter in room;with call bell/phone within reach Nurse Communication: Mobility status PT Visit Diagnosis: Unsteadiness on feet (R26.81);Other abnormalities of gait and mobility (R26.89)     Time: 7829-5621 PT Time Calculation (min) (ACUTE ONLY): 18 min  Charges:   $Gait Training: 8-22 mins                     Johny Shock, PTA Acute Rehabilitation Services Secure Chat Preferred  [phone]    Johny Shock 01/16/2023, 9:22 AM

## 2023-01-16 NOTE — TOC Transition Note (Signed)
Transition of Care [facility]) - CM/SW Discharge Note   Patient Details  Name: MIKI BLANK MRN: [id number] Date of Birth: 06-27-1961  Transition of [facility]) CM/SW Contact:  Epifanio Lesches, RN Phone Number: 01/16/2023, 4:37 PM   Clinical Narrative:    Patient will DC to: home Anticipated DC date: 01/16/2023 Family notified: yes Transport by: car   Per MD patient ready for DC today . RN, patient, patient's family, and [facility] notified of DC.  Pt will transition to sister Mary's home , [address] , [place], [place] [address], short term x 2 weeks .  Pt declined RW and cane . States has a cane @ home and didn't want RW. Pt with hospital f/u on AVS.  Pt without RX med concerns, TOC pharmacy to deliver RX meds to bedside prior to d/c.  Sister to provide transportation to home.  RNCM will sign off for now as intervention is no longer needed. Please consult [place] again if new needs arise.   Final next level of care: Home w Home Health Services Barriers to Discharge: No Barriers Identified   Patient Goals and CMS Choice      Discharge Placement                         Discharge Plan and Services Additional resources added to the After Visit Summary for   In-house Referral: Clinical Social Work   Post Acute Care Choice: Home Health                    HH Arranged: PT, OT [facility] Agency: [facility] Date [facility] Agency Contacted: 01/14/23 Time HH Agency Contacted: [phone] Representative spoke with at [facility] Agency: Kandee Keen  Social Determinants of Health (SDOH) Interventions SDOH Screenings   Tobacco Use: High Risk (01/07/2023)     Readmission Risk Interventions     No data to display

## 2023-01-16 NOTE — Progress Notes (Signed)
Mobility Specialist Progress Note   01/16/23 1040  Mobility  Activity Ambulated with assistance in hallway  Level of Assistance Standby assist, set-up cues, supervision of patient - no hands on  Assistive Device Other (Comment) (IV)  Distance Ambulated (ft) 550 ft  Range of Motion/Exercises Active;All extremities  Activity Response Tolerated well   Patient received in supine and agreeable to participate in mobility. Ambulated in supervision level with slow steady gait. Returned to room without complaint or incident. Was left dangling EOB with all needs met, call bell in reach.   Isaac Rose, BS EXP Mobility Specialist Please contact via SecureChat or Rehab office at [phone]

## 2023-01-20 LAB — PROTHROMBIN GENE MUTATION

## 2023-01-21 MED FILL — Midazolam HCl Inj 5 MG/5ML (Base Equivalent): INTRAMUSCULAR | Qty: 2 | Status: AC

## 2023-01-29 LAB — FACTOR 5 LEIDEN

## 2023-01-30 ENCOUNTER — Other Ambulatory Visit (HOSPITAL_COMMUNITY): Payer: Self-pay

## 2023-02-02 NOTE — Progress Notes (Signed)
[facility] [address] [place] [address]   Clinic Day:  02/03/2023  Referring physician: Merlene Laughter, DO  Patient Care Team: Elfredia Nevins, MD as PCP - General (Internal Medicine)   ASSESSMENT & PLAN:   Assessment:  1.  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  He was also found to be hyponatremic.  He was reportedly sick with vomiting and decreased appetite and decreased activity for a few days prior to presentation to the ER. - CT head without contrast (01/07/2023): Relatively dense appearance of the dural venous sinuses, particularly right transverse/sigmoid sinus. - MRI brain (01/08/2023): No infarction.  Chronic small vessel ischemic changes.  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  Lesser nonocclusive thrombus within the left transverse and sigmoid venous sinuses.  Some superficial venous thrombosis over the parietal convexities as well. - CT CAP (01/10/2023): Acute pulmonary embolism with moderate embolic burden with no evidence of right heart strain.  No evidence of malignancy. - No prior history of thrombosis.  Last colonoscopy in 2008.  He lost about 10-15 pounds in the last 1 year.  2.  Social/family history: -Lives at home with his wife.  He retired after working in the [employer] at [employer] for 28 years.  Quit smoking on 01/07/2023.  Smoked 2-1/2 pack/day, started at age 27.  He had routine use of Roundup exposure for 20 years.  Also had exposure to diquat for couple of years. - Older sister had kidney cancer.  Younger sister had breast cancer.  4-5 maternal uncles had colon cancer.  Few maternal cousins also had colon cancer.  Another maternal cousin had cancer in the back.  Plan:  1.  Cerebral venous sinus  thrombosis and pulmonary embolism: - He had a thrombotic episodes, most likely unprovoked/weakly provoked.  He has bitemporal headaches. - He is tolerating Eliquis very well. - Since he had cerebral venous sinus thrombosis, unprovoked, recommend anticoagulation for at least 1 year. - Recommend checking hypercoagulable workup including factor V Leiden, prothrombin gene mutation, protein C and S activity and antigen, Antithrombin III, lupus anticoagulant, anticardiolipin antibody and antibeta 2 glycoprotein 1 antibody.  Will also check JAK2 V617F with reflex testing and a [facility] panel. - RTC 4 weeks for follow-up.   Orders Placed This Encounter  Procedures   PNH Profile (-High Sensitivity)    Standing Status:   Future    Number of Occurrences:   1    Standing Expiration Date:   02/03/2024   Lupus anticoagulant panel    Standing Status:   Future    Number of Occurrences:   1    Standing Expiration Date:   02/03/2024   D-dimer, quantitative    Standing Status:   Future    Number of Occurrences:   1    Standing Expiration Date:   02/03/2024   Cardiolipin antibodies, IgG, IgM, IgA    Standing Status:   Future    Number of Occurrences:   1    Standing Expiration Date:   02/03/2024   Beta-2-glycoprotein i abs, IgG/M/A    Standing Status:   Future    Number of Occurrences:   1    Standing Expiration Date:   02/03/2024   JAK2 V617F rfx CALR/MPL/E12-15    Standing Status:   Future  Number of Occurrences:   1    Standing Expiration Date:   02/03/2024   Prothrombin gene mutation    Standing Status:   Future    Number of Occurrences:   1    Standing Expiration Date:   02/03/2024   Protein C activity    Standing Status:   Future    Number of Occurrences:   1    Standing Expiration Date:   02/03/2024   Protein C, total    Standing Status:   Future    Number of Occurrences:   1    Standing Expiration Date:   02/03/2024   Protein S activity    Standing Status:   Future    Number of Occurrences:   1     Standing Expiration Date:   02/03/2024   Protein S, total and free    Standing Status:   Future    Number of Occurrences:   1    Standing Expiration Date:   02/03/2024   Antithrombin III    Standing Status:   Future    Number of Occurrences:   1    Standing Expiration Date:   02/03/2024   Factor 5 leiden    Standing Status:   Future    Number of Occurrences:   1    Standing Expiration Date:   02/03/2024      I,Katie Daubenspeck,acting as a scribe for Doreatha Massed, MD.,have documented all relevant documentation on the behalf of Doreatha Massed, MD,as directed by  Doreatha Massed, MD while in the presence of Doreatha Massed, MD.   I, Doreatha Massed MD, have reviewed the above documentation for accuracy and completeness, and I agree with the above.   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 male presenting to clinic today for evaluation of hypercoagulable state at the request of Dr. Marland Mcalpine (hospitalist).  He presented to the ED on 01/07/23 with confusion after being found on the floor by his wife. He was subsequently admitted for hyponatremia and confusion. Brain MRI the following day reveled venous sinus thrombosis-- extensive thrombosis of superior sagittal sinus and right transverse sinus extending into right internal jugular vein, as well as nonocclusive thrombus in left transverse and sigmoid sinuses. Thus, he was started on heparin. A CT C/A/P showed an incidental PE but no evidence of malignancy. He was discharged on apixaban.  Today, he states that he is doing well overall. His appetite level is at 100%. His energy level is at 50%.    PAST MEDICAL HISTORY:   Past Medical History: Past Medical History:  Diagnosis Date   Chronic constipation    COPD (chronic  obstructive pulmonary disease) (HCC)    Depression    GERD (gastroesophageal reflux disease)    Headache    Hypertension     Surgical History: Past Surgical History:  Procedure Laterality Date   APPENDECTOMY     COLONOSCOPY  08   NUR   head trauma     from trauma   UPPER GASTROINTESTINAL ENDOSCOPY      Social History: Social History   Socioeconomic History   Marital status: Legally Separated    Spouse name: Not on file   Number of children: Not on file   Years of education: Not on file   Highest education level: Not  on file  Occupational History   Not on file  Tobacco Use   Smoking status: Every Day    Packs/day: 3.00    Years: 48.00    Additional pack years: 0.00    Total pack years: 144.00    Types: Cigarettes    Start date: 25   Smokeless tobacco: Former    Types: Chew    Quit date: 04/29/1987   Tobacco comments:    1 1/2 pack a day 34 yrs patient is aware he needs to quit   Vaping Use   Vaping Use: Never used  Substance and Sexual Activity   Alcohol use: Yes    Alcohol/week: 0.0 standard drinks of alcohol    Comment: occasionally   Drug use: No   Sexual activity: Never    Partners: Female  Other Topics Concern   Not on file  Social History Narrative   Not on file   Social Determinants of Health   Financial Resource Strain: Not on file  Food Insecurity: No Food Insecurity (02/03/2023)   Hunger Vital Sign    Worried About Running Out of Food in the Last Year: Never true    Ran Out of Food in the Last Year: Never true  Transportation Needs: No Transportation Needs (02/03/2023)   PRAPARE - Administrator, Civil Service (Medical): No    Lack of Transportation (Non-Medical): No  Physical Activity: Not on file  Stress: Not on file  Social Connections: Not on file  Intimate Partner Violence: Not At Risk (02/03/2023)   Humiliation, Afraid, Rape, and Kick questionnaire    Fear of Current or Ex-Partner: No    Emotionally Abused: No     Physically Abused: No    Sexually Abused: No    Family History: Family History  Problem Relation Age of Onset   Lung cancer Mother    Emphysema Father    Kidney cancer Sister    Heart disease Brother    Aneurysm Sister    Breast cancer Sister    Colon cancer Maternal Aunt    Colon cancer Maternal Uncle        x4    Current Medications:  Current Outpatient Medications:    acetaminophen (TYLENOL) 500 MG tablet, Take 500-1,000 mg by mouth daily as needed for mild pain or headache., Disp: , Rfl:    albuterol (VENTOLIN HFA) 108 (90 Base) MCG/ACT inhaler, Inhale 2 puffs into the lungs every 4 (four) hours as needed for shortness of breath., Disp: 6.7 g, Rfl: 0   alum & mag hydroxide-simeth (MAALOX PLUS) 400-400-40 MG/5ML suspension, Take 15 mLs by mouth every 6 (six) hours as needed for indigestion., Disp: 355 mL, Rfl: 0   amLODipine (NORVASC) 10 MG tablet, Take 10 mg by mouth daily., Disp: , Rfl: 0   apixaban (ELIQUIS) 5 MG TABS tablet, Take 2 tablets (10 mg total) by mouth 2 (two) times daily for 2 days, THEN 1 tablet (5 mg total) 2 (two) times daily thereafter, Disp: 68 tablet, Rfl: 0   cyanocobalamin 1000 MCG tablet, Take 1 tablet (1,000 mcg total) by mouth daily., Disp: 30 tablet, Rfl: 0   docusate sodium (COLACE) 100 MG capsule, Take 1 capsule (100 mg total) by mouth 2 (two) times daily as needed for mild constipation., Disp: 100 capsule, Rfl: 0   famotidine (PEPCID) 20 MG tablet, Take 1 tablet (20 mg total) by mouth daily., Disp: 30 tablet, Rfl: 0   feeding supplement (ENSURE ENLIVE / ENSURE PLUS) LIQD,  Take 237 mLs by mouth 3 (three) times daily between meals., Disp: 237 mL, Rfl: 12   folic acid (FOLVITE) 1 MG tablet, Take 1 tablet (1 mg total) by mouth daily., Disp: 30 tablet, Rfl: 0   labetalol (NORMODYNE) 300 MG tablet, Take 300 mg by mouth 2 (two) times daily., Disp: , Rfl:    losartan (COZAAR) 50 MG tablet, Take 1 tablet (50 mg total) by mouth daily., Disp: 30 tablet, Rfl: 0    Multiple Vitamin (MULTIVITAMIN WITH MINERALS) TABS tablet, Take 1 tablet by mouth daily., Disp: 130 tablet, Rfl: 0   pantoprazole (PROTONIX) 40 MG tablet, Take 1 tablet (40 mg total) by mouth 2 (two) times daily., Disp: 60 tablet, Rfl: 0   polyethylene glycol powder (GLYCOLAX/MIRALAX) 17 GM/SCOOP powder, Take 1 capful (17 g) by mouth daily as needed for moderate constipation., Disp: 238 g, Rfl: 0   pyridOXINE (B-6) 50 MG tablet, Take 1 tablet (50 mg total) by mouth daily for 30 days then as directed by MD, Disp: 100 tablet, Rfl: 0   senna-docusate (SENOKOT-S) 8.6-50 MG tablet, Take 1 tablet by mouth daily., Disp: 30 tablet, Rfl: 0   thiamine (VITAMIN B1) 100 MG tablet, Take 1 tablet (100 mg total) by mouth daily., Disp: 30 tablet, Rfl: 0   umeclidinium-vilanterol (ANORO ELLIPTA) 62.5-25 MCG/ACT AEPB, Inhale 1 puff into the lungs daily., Disp: 60 each, Rfl: 0   Allergies: Allergies  Allergen Reactions   Effexor [Venlafaxine Hydrochloride] Other (See Comments)    unknown    REVIEW OF SYSTEMS:   Review of Systems  Constitutional:  Positive for fatigue. Negative for chills and fever.  HENT:   Negative for lump/mass, mouth sores, nosebleeds, sore throat and trouble swallowing.   Eyes:  Negative for eye problems.  Respiratory:  Negative for cough and shortness of breath.   Cardiovascular:  Negative for chest pain, leg swelling and palpitations.  Gastrointestinal:  Negative for abdominal pain, constipation, diarrhea, nausea and vomiting.  Genitourinary:  Negative for bladder incontinence, difficulty urinating, dysuria, frequency, hematuria and nocturia.   Musculoskeletal:  Negative for arthralgias, back pain, flank pain, myalgias and neck pain.  Skin:  Negative for itching and rash.  Neurological:  Positive for dizziness, headaches and numbness.  Hematological:  Does not bruise/bleed easily.  Psychiatric/Behavioral:  Positive for sleep disturbance. Negative for depression and suicidal ideas.  The patient is not nervous/anxious.   All other systems reviewed and are negative.    VITALS:   There were no vitals taken for this visit.  Wt Readings from Last 3 Encounters:  01/12/23 143 lb 1.3 oz (64.9 kg)  03/17/19 153 lb 6.4 oz (69.6 kg)  03/15/19 154 lb 9.6 oz (70.1 kg)    There is no height or weight on file to calculate BMI.   PHYSICAL EXAM:   Physical Exam Vitals and nursing note reviewed. Exam conducted with a chaperone present.  Constitutional:      Appearance: Normal appearance.  Cardiovascular:     Rate and Rhythm: Normal rate and regular rhythm.     Pulses: Normal pulses.     Heart sounds: Normal heart sounds.  Pulmonary:     Effort: Pulmonary effort is normal.     Breath sounds: Normal breath sounds.  Abdominal:     Palpations: Abdomen is soft. There is no hepatomegaly, splenomegaly or mass.     Tenderness: There is no abdominal tenderness.  Musculoskeletal:     Right lower leg: No edema.     Left  lower leg: No edema.  Lymphadenopathy:     Cervical: No cervical adenopathy.     Right cervical: No superficial, deep or posterior cervical adenopathy.    Left cervical: No superficial, deep or posterior cervical adenopathy.     Upper Body:     Right upper body: No supraclavicular or axillary adenopathy.     Left upper body: No supraclavicular or axillary adenopathy.  Neurological:     General: No focal deficit present.     Mental Status: He is alert and oriented to person, place, and time.  Psychiatric:        Mood and Affect: Mood normal.        Behavior: Behavior normal.     LABS:      Latest Ref Rng & Units 01/16/2023   11:51 AM 01/15/2023    3:15 AM 01/14/2023    4:45 AM  CBC  WBC 4.0 - 10.5 K/uL 9.2  8.4  8.3   Hemoglobin 13.0 - 17.0 g/dL 62.3  76.2  83.1   Hematocrit 39.0 - 52.0 % 37.0  36.4  37.7   Platelets 150 - 400 K/uL 433  381  367       Latest Ref Rng & Units 01/16/2023   11:51 AM 01/15/2023   10:24 AM 01/14/2023    4:45 AM  CMP   Glucose 70 - 99 mg/dL 87  80  91   BUN 8 - 23 mg/dL 6  10  12    Creatinine 0.61 - 1.24 mg/dL 5.17  6.16  0.73   Sodium 135 - 145 mmol/L 131  127  127   Potassium 3.5 - 5.1 mmol/L 4.2  4.2  4.2   Chloride 98 - 111 mmol/L 99  94  91   CO2 22 - 32 mmol/L 24  24  27    Calcium 8.9 - 10.3 mg/dL 8.4  8.5  8.9   Total Protein 6.5 - 8.1 g/dL 6.4  6.5  6.6   Total Bilirubin 0.3 - 1.2 mg/dL 0.9  1.0  1.1   Alkaline Phos 38 - 126 U/L 79  78  76   AST 15 - 41 U/L 22  24  23    ALT 0 - 44 U/L 15  15  14       No results found for: "CEA1", "CEA" / No results found for: "CEA1", "CEA" No results found for: "PSA1" No results found for: "XTG626" No results found for: "CAN125"  No results found for: "TOTALPROTELP", "ALBUMINELP", "A1GS", "A2GS", "BETS", "BETA2SER", "GAMS", "MSPIKE", "SPEI" No results found for: "TIBC", "FERRITIN", "IRONPCTSAT" No results found for: "LDH"   STUDIES:   DG Abd 1 View  Result Date: 01/13/2023 CLINICAL DATA:  Abdominal pain EXAM: ABDOMEN - 1 VIEW COMPARISON:  CT 01/10/2023 FINDINGS: Gas and residual contrast material throughout the colon. No large or small bowel distention. No radiopaque stones. Degenerative changes in the spine and hips. IMPRESSION: Nonobstructive bowel gas pattern with residual contrast material in the colon. Electronically Signed   By: Burman Nieves M.D.   On: 01/13/2023 18:17   VAS [place] LOWER EXTREMITY VENOUS (DVT)  Result Date: 01/12/2023  Lower Venous DVT Study Patient Name:  ALYX MUSKETT  Date of Exam:   01/11/2023 Medical Rec #: [id number]          Accession #:    [id number] Date of Birth: 23-Jun-1961          Patient Gender: M Patient Age:   75 years Exam Location:  [facility] Procedure:      VAS [place] LOWER EXTREMITY VENOUS (DVT) Referring Phys: Scheryl Marten XU --------------------------------------------------------------------------------  Other Indications: Dural venous sinus thrombosis, right IJV thrombus. Risk Factors: Confirmed PE ETOH  withdrawal. Comparison Study: No prior study on file Performing Technologist: Sherren Kerns RVS  Examination Guidelines: A complete evaluation includes B-mode imaging, spectral Doppler, color Doppler, and power Doppler as needed of all accessible portions of each vessel. Bilateral testing is considered an integral part of a complete examination. Limited examinations for reoccurring indications may be performed as noted. The reflux portion of the exam is performed with the patient in reverse Trendelenburg.  +---------+---------------+---------+-----------+----------+-------------------+ RIGHT    CompressibilityPhasicitySpontaneityPropertiesThrombus Aging      +---------+---------------+---------+-----------+----------+-------------------+ CFV      Full           Yes      No                                       +---------+---------------+---------+-----------+----------+-------------------+ SFJ      Full                                                             +---------+---------------+---------+-----------+----------+-------------------+ FV Prox  Full           No       No                                       +---------+---------------+---------+-----------+----------+-------------------+ FV Mid   Full           No       No                                       +---------+---------------+---------+-----------+----------+-------------------+ FV DistalNone           No       No                   Age Indeterminate   +---------+---------------+---------+-----------+----------+-------------------+ PFV      Full           Yes      Yes                                      +---------+---------------+---------+-----------+----------+-------------------+ POP      None           No       No                   Age Indeterminate   +---------+---------------+---------+-----------+----------+-------------------+ PTV      None           No       No                    Age Indeterminate   +---------+---------------+---------+-----------+----------+-------------------+ PERO  Not well visualized +---------+---------------+---------+-----------+----------+-------------------+ Gastroc  None           No       No                   Age Indeterminate   +---------+---------------+---------+-----------+----------+-------------------+   +---------+---------------+---------+-----------+----------+--------------+ LEFT     CompressibilityPhasicitySpontaneityPropertiesThrombus Aging +---------+---------------+---------+-----------+----------+--------------+ CFV      Full           Yes      Yes                                 +---------+---------------+---------+-----------+----------+--------------+ SFJ      Full                                                        +---------+---------------+---------+-----------+----------+--------------+ FV Prox  Full           Yes      Yes                                 +---------+---------------+---------+-----------+----------+--------------+ FV Mid   Full                                                        +---------+---------------+---------+-----------+----------+--------------+ FV DistalFull                                                        +---------+---------------+---------+-----------+----------+--------------+ PFV      Full           Yes      Yes                                 +---------+---------------+---------+-----------+----------+--------------+ POP      Full           Yes      Yes                                 +---------+---------------+---------+-----------+----------+--------------+ PTV      Full                                                        +---------+---------------+---------+-----------+----------+--------------+ PERO     Full                                                         +---------+---------------+---------+-----------+----------+--------------+     Summary: RIGHT: - Findings  consistent with age indeterminate deep vein thrombosis involving the right femoral vein, right popliteal vein, right posterior tibial veins, and right gastrocnemius veins. - No cystic structure found in the popliteal fossa.  LEFT: - No evidence of deep vein thrombosis in the lower extremity. No indirect evidence of obstruction proximal to the inguinal ligament. - No cystic structure found in the popliteal fossa.  *See table(s) above for measurements and observations. Electronically signed by Gerarda Fraction on 01/12/2023 at 3:55:39 PM.    Final    CT ANGIO HEAD NECK W WO CM  Result Date: 01/11/2023 CLINICAL DATA:  venous sinus thrombosis. EXAM: CT ANGIOGRAPHY HEAD AND NECK WITH AND WITHOUT CONTRAST TECHNIQUE: Multidetector CT imaging of the head and neck was performed using the standard protocol during bolus administration of intravenous contrast. Multiplanar CT image reconstructions and MIPs were obtained to evaluate the vascular anatomy. Carotid stenosis measurements (when applicable) are obtained utilizing NASCET criteria, using the distal internal carotid diameter as the denominator. RADIATION DOSE REDUCTION: This exam was performed according to the departmental dose-optimization program which includes automated exposure control, adjustment of the mA and/or kV according to patient size and/or use of iterative reconstruction technique. CONTRAST:  75mL OMNIPAQUE IOHEXOL 350 MG/ML SOLN COMPARISON:  MRI brain 01/08/2023.  Head CT 01/07/2023. FINDINGS: CT HEAD FINDINGS Brain: No acute hemorrhage. Unchanged moderate chronic small-vessel disease. Cortical gray-white differentiation is otherwise preserved. Mildly age advanced volume loss. No hydrocephalus or extra-axial collection. No mass effect or midline shift. Vascular: Persistent hyperdensity of the superior sagittal sinus, right transverse and sigmoid sinuses.  Skull: No calvarial fracture or suspicious bone lesion. Skull base is unremarkable. Sinuses/Orbits: Unremarkable. Other: None. Review of the MIP images confirms the above findings CTA NECK FINDINGS Aortic arch: Three-vessel arch configuration. Atherosclerotic vascular changes of the aortic arch and arch vessels. Moderate eccentric soft plaque in the left subclavian artery without hemodynamically significant stenosis. Friable soft plaque in the right subclavian artery (image 80 series 9). Right carotid system: No evidence of dissection, stenosis (50% or greater), or occlusion. Mild calcified plaque at the right carotid bulb. Left carotid system: No evidence of dissection, stenosis (50% or greater), or occlusion. Mild calcified plaque at the left carotid bulb. 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 The left vertebral artery is patent without stenosis or evidence of dissection. Skeleton: Mild cervical spondylosis without high-grade spinal canal stenosis. No suspicious bone lesions. Other neck: Unremarkable. Upper chest: Moderate centrilobular emphysema in the lung apices. Review of the MIP images confirms the above findings CTA HEAD FINDINGS Anterior circulation: Calcified plaque along the carotid siphons without hemodynamically significant stenosis. The proximal ACAs and MCAs are patent without stenosis or aneurysm. Distal branches are symmetric. Posterior circulation: Normal basilar artery. The SCAs, AICAs and PICAs are patent proximally. The PCAs are patent proximally without stenosis or aneurysm. Distal branches are symmetric. Venous sinuses: Within the limits of early phase of contrast, unchanged appearance of extensive dural venous sinus thrombosis throughout the superior sagittal sinus and right-greater-than-left transverse and sigmoid sinuses. Anatomic variants: None. Review of the MIP images confirms the above  findings IMPRESSION: 1. Unchanged appearance of extensive dural venous sinus thrombosis within the limits of arterial timed exam. 2. 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 3. No intracranial large vessel occlusion or significant stenosis. 4. Friable soft plaque in the right subclavian artery. Aortic Atherosclerosis (ICD10-I70.0) and Emphysema (  ICD10-J43.9). Electronically Signed   By: Orvan Falconer M.D.   On: 01/11/2023 15:43   CT CHEST ABDOMEN PELVIS W CONTRAST  Addendum Date: 01/10/2023   ADDENDUM REPORT: 01/10/2023 20:30 ADDENDUM: These results were called by telephone at the time of interpretation on 01/10/2023 at 7:44 Pm to provider Cloyd Stagers, PA, who verbally acknowledged these results. Electronically Signed   By: Helyn Numbers M.D.   On: 01/10/2023 20:30   Result Date: 01/10/2023 CLINICAL DATA:  Occult malignancy EXAM: CT CHEST, ABDOMEN, AND PELVIS WITH CONTRAST TECHNIQUE: Multidetector CT imaging of the chest, abdomen and pelvis was performed following the standard protocol during bolus administration of intravenous contrast. RADIATION DOSE REDUCTION: This exam was performed according to the departmental dose-optimization program which includes automated exposure control, adjustment of the mA and/or kV according to patient size and/or use of iterative reconstruction technique. CONTRAST:  75mL OMNIPAQUE IOHEXOL 350 MG/ML SOLN COMPARISON:  None Available. FINDINGS: CT CHEST FINDINGS Cardiovascular: Extensive multi-vessel coronary artery calcification. Global cardiac size within normal limits. No pericardial effusion. The central pulmonary arteries are of normal caliber. There is incidentally noted intraluminal filling defect within the right main pulmonary artery peripherally extending into multiple segmental pulmonary arteries of the right middle and lower lobes in keeping with acute pulmonary embolism. The embolic  burden is moderate. No CT evidence of right heart strain. Moderate atherosclerotic calcification within the thoracic aorta proximally. Extensive smooth atheromatous plaque within the descending thoracic and proximal abdominal aorta. Left subclavian central venous catheter tip noted at the superior vena caval confluence. Mediastinum/Nodes: No enlarged mediastinal, hilar, or axillary lymph nodes. Thyroid gland, trachea, and esophagus demonstrate no significant findings. Lungs/Pleura: Moderate emphysema. Peripheral airspace infiltrate within the posterior right upper lobe and right lower lobe may reflect changes of acute infarct given the associated pulmonary embolism. Acute infection could appear similarly. Small right parapneumonic effusion. Trace left effusion. No pneumothorax. No central obstructing lesion. Musculoskeletal: No acute bone abnormality. No lytic or blastic bone lesion. CT ABDOMEN PELVIS FINDINGS Hepatobiliary: Tiny hypodensities within the left hepatic lobe are too small to accurately characterize. Liver otherwise unremarkable. Gallbladder unremarkable. No intra or extrahepatic biliary ductal dilation. Pancreas: Unremarkable Spleen: Unremarkable Adrenals/Urinary Tract: The adrenal glands are unremarkable. The kidneys are normal in size and position. Probable simple cortical cyst within the lower pole the right kidney for which no follow-up imaging is recommended. 2 mm nonobstructing calculus within the interpolar region of the right kidney. No hydronephrosis. No ureteral calculi. Bladder unremarkable. Stomach/Bowel: Nasoenteric feeding tube tip noted within the pylorus. Stomach, small bowel, and large bowel are otherwise unremarkable. Appendix absent. No free intraperitoneal gas or fluid. Vascular/Lymphatic: Extensive aortoiliac atherosclerotic calcification. No aortic aneurysm. No pathologic adenopathy within the abdomen and pelvis. Reproductive: Prostate is unremarkable. Other: Tiny fat containing  umbilical hernia. Musculoskeletal: Degenerative changes are seen within the lumbar spine. No lytic or blastic bone lesion. No acute bone abnormality. IMPRESSION: 1. Acute pulmonary embolism. Moderate embolic burden. No CT evidence of right heart strain. 2. Peripheral airspace infiltrate within the posterior right upper lobe and right lower lobe may reflect changes of acute infarct given the associated pulmonary embolism. Acute infection could appear similarly. Small right parapneumonic effusion. 3. Moderate emphysema. 4. Extensive multi-vessel coronary artery calcification. 5. Minimal right nonobstructing nephrolithiasis. Aortic Atherosclerosis (ICD10-I70.0) and Emphysema (ICD10-J43.9). Electronically Signed: By: Helyn Numbers M.D. On: 01/10/2023 19:16   ECHOCARDIOGRAM COMPLETE  Result Date: 01/10/2023    ECHOCARDIOGRAM REPORT   Patient Name:   Isaac Rose Date  of Exam: 01/10/2023 Medical Rec #:  [id number]         Height:       70.0 in Accession #:    [id number]        Weight:       150.6 lb Date of Birth:  03-26-1961         BSA:          1.850 m Patient Age:    61 years          BP:           133/71 mmHg Patient Gender: M                 HR:           81 bpm. Exam Location:  Inpatient Procedure: 2D Echo, Cardiac Doppler and Color Doppler Indications:    Stroke  History:        Patient has no prior history of Echocardiogram examinations.                 COPD; Risk Factors:Hypertension and Current Smoker.  Sonographer:    Lucy Antigua Referring Phys: [id number] PRAMOD S SETHI  Sonographer Comments: Suboptimal parasternal window. Image acquisition challenging due to COPD and Image acquisition challenging due to respiratory motion. IMPRESSIONS  1. Left ventricular ejection fraction, by estimation, is 60 to 65%. The left ventricle has normal function. The left ventricle has no regional wall motion abnormalities. Left ventricular diastolic parameters were normal.  2. Right ventricular systolic function is normal. The  right ventricular size is normal.  3. The mitral valve is normal in structure. Trivial mitral valve regurgitation. No evidence of mitral stenosis.  4. The aortic valve is normal in structure. Aortic valve regurgitation is not visualized. No aortic stenosis is present.  5. The inferior vena cava is normal in size with greater than 50% respiratory variability, suggesting right atrial pressure of 3 mmHg. FINDINGS  Left Ventricle: Left ventricular ejection fraction, by estimation, is 60 to 65%. The left ventricle has normal function. The left ventricle has no regional wall motion abnormalities. The left ventricular internal cavity size was normal in size. There is  no left ventricular hypertrophy. Left ventricular diastolic parameters were normal. Right Ventricle: The right ventricular size is normal. No increase in right ventricular wall thickness. Right ventricular systolic function is normal. Left Atrium: Left atrial size was normal in size. Right Atrium: Right atrial size was normal in size. Pericardium: There is no evidence of pericardial effusion. Mitral Valve: The mitral valve is normal in structure. Trivial mitral valve regurgitation. No evidence of mitral valve stenosis. Tricuspid Valve: The tricuspid valve is normal in structure. Tricuspid valve regurgitation is trivial. No evidence of tricuspid stenosis. Aortic Valve: The aortic valve is normal in structure. Aortic valve regurgitation is not visualized. No aortic stenosis is present. Aortic valve mean gradient measures 6.0 mmHg. Aortic valve peak gradient measures 9.4 mmHg. Aortic valve area, by VTI measures 2.42 cm. Pulmonic Valve: The pulmonic valve was not well visualized. Pulmonic valve regurgitation is not visualized. No evidence of pulmonic stenosis. Aorta: The aortic root is normal in size and structure. Venous: The inferior vena cava is normal in size with greater than 50% respiratory variability, suggesting right atrial pressure of 3 mmHg.  IAS/Shunts: No atrial level shunt detected by color flow Doppler.  LEFT VENTRICLE PLAX 2D LVOT diam:     1.90 cm   Diastology LV SV:  69        LV e' medial:    9.46 cm/s LV SV Index:   37        LV E/e' medial:  8.3 LVOT Area:     2.84 cm  LV e' lateral:   12.90 cm/s                          LV E/e' lateral: 6.1  RIGHT VENTRICLE RV S prime:     19.80 cm/s TAPSE (M-mode): 2.2 cm LEFT ATRIUM           Index        RIGHT ATRIUM           Index LA Vol (A4C): 44.5 ml 24.05 ml/m  RA Area:     17.75 cm                                    RA Volume:   49.90 ml  26.97 ml/m  AORTIC VALVE AV Area (Vmax):    2.33 cm AV Area (Vmean):   2.16 cm AV Area (VTI):     2.42 cm AV Vmax:           153.00 cm/s AV Vmean:          111.000 cm/s AV VTI:            0.284 m AV Peak Grad:      9.4 mmHg AV Mean Grad:      6.0 mmHg LVOT Vmax:         126.00 cm/s LVOT Vmean:        84.700 cm/s LVOT VTI:          0.242 m LVOT/AV VTI ratio: 0.85  AORTA Ao Root diam: 3.50 cm Ao Asc diam:  3.60 cm MITRAL VALVE MV Area (PHT): 2.29 cm    SHUNTS MV Decel Time: 331 msec    Systemic VTI:  0.24 m MV E velocity: 78.60 cm/s  Systemic Diam: 1.90 cm MV A velocity: 55.00 cm/s MV E/A ratio:  1.43 Arvilla Meres MD Electronically signed by Arvilla Meres MD Signature Date/Time: 01/10/2023/12:18:09 PM    Final    EEG adult  Result Date: 01/09/2023 Charlsie Quest, MD     01/09/2023  5:12 PM Patient Name: Isaac Rose MRN: [id number] Epilepsy Attending: Charlsie Quest Referring Physician/Provider: Erick Blinks, MD Date: 01/09/2023 Duration: 27.42 mins Patient history: 62yo M with Hx of etoh use, presenting with confusion, vomiting, r sided abrasions, 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 EEG to evaluate for seizure. Level of alertness: Awake, asleep AEDs during EEG study: None Technical aspects: This EEG study was done with scalp electrodes positioned according to the 10-20 International system  of electrode placement. Electrical activity was reviewed with band pass filter of 1-70Hz , sensitivity of 7 uV/mm, display speed of 37mm/sec with a 60Hz  notched filter applied as appropriate. EEG data were recorded continuously and digitally stored.  Video monitoring was available and reviewed as appropriate. Description: No posterior dominant rhythm was seen. Sleep was characterized by vertex waves, sleep spindles (12 to 14 Hz), maximal frontocentral region. EEG showed continuous generalized 3 to 6 Hz theta-delta slowing.  Hyperventilation and photic stimulation were not performed.   ABNORMALITY - Continuous slow, generalized IMPRESSION: This study is suggestive of moderate diffuse encephalopathy, nonspecific  etiology. No seizures or epileptiform discharges were seen throughout the recording. If suspicion for interictal activity remains a concern, a prolonged study can be considered. Priyanka Annabelle Harman   DG CHEST PORT 1 VIEW  Result Date: 01/08/2023 CLINICAL DATA:  Central line placement EXAM: PORTABLE CHEST 1 VIEW COMPARISON:  01/07/2023 FINDINGS: There is interval placement of left subclavian central venous catheter with its tip at the junction of innominate vein and superior vena cava. NG tube is noted traversing the esophagus. Cardiac size is within normal limits. There are no signs of pulmonary edema or focal pulmonary consolidation. There is no evidence of any large pleural effusion or pneumothorax in this supine radiograph. IMPRESSION: Interval placement of left subclavian central venous catheter with its tip at the junction of superior vena cava. There is no evidence of any significant pneumothorax in this AP supine portable view. Electronically Signed   By: Ernie Avena M.D.   On: 01/08/2023 18:04   MR CERVICAL SPINE W WO CONTRAST  Result Date: 01/08/2023 CLINICAL DATA:  Myelopathy, chronic, cervical spine. Altered mental status. EXAM: MRI CERVICAL SPINE WITHOUT AND WITH CONTRAST TECHNIQUE:  Multiplanar and multiecho pulse sequences of the cervical spine, to include the craniocervical junction and cervicothoracic junction, were obtained without and with intravenous contrast. CONTRAST:  7mL GADAVIST GADOBUTROL 1 MMOL/ML IV SOLN COMPARISON:  CT yesterday. FINDINGS: Study suffers from motion degradation. Alignment: No significant malalignment. Vertebrae: No focal bone lesion. Cord: No cord compression or focal cord lesion. Posterior Fossa, vertebral arteries, paraspinal tissues: Intracranial venous sinus thrombosis as described on the brain examination. Disc levels: Mild spondylosis from C2-3 through C5-6. No compressive stenosis of the canal. Foraminal narrowing at C5-6 right more than left could affect in particular the right C6 nerve. IMPRESSION: 1. Motion degraded study. No cord compression or focal cord lesion. 2. Degenerative spondylosis from C2-3 through C5-6. Foraminal narrowing at C5-6 right more than left could affect in particular the right C6 nerve. 3. 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 Electronically Signed   By: Paulina Fusi M.D.   On: 01/08/2023 17:23   MR BRAIN W WO CONTRAST  Result Date: 01/08/2023 CLINICAL DATA:  mental status change of unknown cause EXAM: MRI HEAD WITHOUT AND WITH CONTRAST TECHNIQUE: Multiplanar, multiecho pulse sequences of the brain and surrounding structures were obtained without and with intravenous contrast. CONTRAST:  7mL GADAVIST GADOBUTROL 1 MMOL/ML IV SOLN COMPARISON:  Head CT yesterday FINDINGS: Brain: Diffusion imaging does not show any acute or subacute infarction. No focal abnormality affects the brainstem or cerebellum. Cerebral hemispheres show chronic small-vessel ischemic changes of the deep white matter and an old right parietal cortical and subcortical infarction. No mass, hemorrhage, hydrocephalus or extra-axial collection. This examination does confirm the presence of thrombosis of the superior sagittal  sinus, right transverse sinus, extending through to the proximal right internal jugular vein. There may be partial thrombosis of the left transverse and sigmoid sinus. There is thrombosis of some superficial cortical veins as well in the parietal regions. Vascular: See above regarding venous thrombosis. Arterial structures show flow with the exception of the right vertebral artery. Skull and upper cervical spine: Negative Sinuses/Orbits: Clear/normal Other: None IMPRESSION: 1. No evidence of acute infarction. Chronic small-vessel ischemic changes of the cerebral hemispheric white matter. 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 2. 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 Lesser, nonocclusive thrombus within the left transverse and sigmoid  venous sinuses. Some superficial venous thrombosis over the parietal convexities as well. Electronically Signed   By: Paulina Fusi M.D.   On: 01/08/2023 17:21   DG Abd Portable 1V  Result Date: 01/08/2023 CLINICAL DATA:  Feeding tube placement. EXAM: PORTABLE ABDOMEN - 1 VIEW COMPARISON:  None Available. FINDINGS: Tip of the weighted enteric tube is below the diaphragm in the midline in the region of the distal stomach. No small bowel distention in the upper abdomen. IMPRESSION: Tip of the weighted enteric tube in the region of the distal stomach. Electronically Signed   By: Narda Rutherford M.D.   On: 01/08/2023 09:59   CT Head Wo Contrast  Result Date: 01/07/2023 CLINICAL DATA:  Mental status change, unknown cause; Neck trauma, dangerous injury mechanism (Age 60-64y) EXAM: CT HEAD WITHOUT CONTRAST CT CERVICAL SPINE WITHOUT CONTRAST TECHNIQUE: Multidetector CT imaging of the head and cervical spine was performed following the standard protocol without intravenous contrast. Multiplanar CT image reconstructions of the cervical spine were also generated. RADIATION DOSE  REDUCTION: This exam was performed according to the departmental dose-optimization program which includes automated exposure control, adjustment of the mA and/or kV according to patient size and/or use of iterative reconstruction technique. COMPARISON:  CT head January 05, 2015. FINDINGS: CT HEAD FINDINGS Brain: No evidence of acute large vascular territory infarct, mass lesion, midline shift or definite acute hemorrhage. Vascular: Relatively dense appearance of the dural venous sinuses, particularly of the right transverse/sigmoid sinus. Skull: No acute fracture. Sinuses/Orbits: Clear sinuses.  No acute orbital findings. CT CERVICAL SPINE FINDINGS Alignment: No evidence of substantial sagittal subluxation. Skull base and vertebrae: Vertebral body heights are maintained. Soft tissues and spinal canal: No prevertebral fluid or swelling. No visible canal hematoma. Disc levels: Multilevel degenerative disease including disc bulges and endplate spurring. Multilevel facet and uncovertebral hypertrophy with varying degrees of neural foraminal stenosis, greatest and likely severe on the right at C5-C6. Upper chest: Emphysema.  Visualized lung apices are clear. IMPRESSION: 1. Relatively dense appearance of the dural venous sinuses, particularly of the right transverse/sigmoid sinus. This could be artifactual, but recommend MRI head with contrast to exclude dural venous sinus thrombosis. 2. No evidence of acute fracture or traumatic malalignment in the cervical spine. 3. 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 MRI of the cervical spine could further evaluate if clinically warranted. Electronically Signed   By: Feliberto Harts M.D.   On: 01/07/2023 17:10   CT Cervical Spine Wo Contrast  Result Date: 01/07/2023 CLINICAL DATA:  Mental status change, unknown cause; Neck trauma, dangerous injury mechanism (Age 42-64y) EXAM: CT HEAD WITHOUT CONTRAST CT CERVICAL SPINE WITHOUT CONTRAST TECHNIQUE: Multidetector CT imaging  of the head and cervical spine was performed following the standard protocol without intravenous contrast. Multiplanar CT image reconstructions of the cervical spine were also generated. RADIATION DOSE REDUCTION: This exam was performed according to the departmental dose-optimization program which includes automated exposure control, adjustment of the mA and/or kV according to patient size and/or use of iterative reconstruction technique. COMPARISON:  CT head January 05, 2015. FINDINGS: CT HEAD FINDINGS Brain: No evidence of acute large vascular territory infarct, mass lesion, midline shift or definite acute hemorrhage. Vascular: Relatively dense appearance of the dural venous sinuses, particularly of the right transverse/sigmoid sinus. Skull: No acute fracture. Sinuses/Orbits: Clear sinuses.  No acute orbital findings. CT CERVICAL SPINE FINDINGS Alignment: No evidence of substantial sagittal subluxation. Skull base and vertebrae: Vertebral body heights are maintained. Soft tissues and spinal canal: No prevertebral  fluid or swelling. No visible canal hematoma. Disc levels: Multilevel degenerative disease including disc bulges and endplate spurring. Multilevel facet and uncovertebral hypertrophy with varying degrees of neural foraminal stenosis, greatest and likely severe on the right at C5-C6. Upper chest: Emphysema.  Visualized lung apices are clear. IMPRESSION: 1. Relatively dense appearance of the dural venous sinuses, particularly of the right transverse/sigmoid sinus. This could be artifactual, but recommend MRI head with contrast to exclude dural venous sinus thrombosis. 2. No evidence of acute fracture or traumatic malalignment in the cervical spine. 3. 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 MRI of the cervical spine could further evaluate if clinically warranted. Electronically Signed   By: Feliberto Harts M.D.   On: 01/07/2023 17:10   DG Chest Portable 1 View  Result Date: 01/07/2023 CLINICAL  DATA:  Altered mental status EXAM: PORTABLE CHEST 1 VIEW COMPARISON:  X-ray 09/21/2018 and older FINDINGS: The left inferior costophrenic angle is clipped off the edge of the film. No consolidation, pneumothorax or effusion. No edema. Normal cardiopericardial silhouette. Calcified aorta. Overlapping cardiac leads. IMPRESSION: No acute cardiopulmonary disease Electronically Signed   By: Karen Kays M.D.   On: 01/07/2023 17:07

## 2023-02-03 ENCOUNTER — Inpatient Hospital Stay: Payer: BC Managed Care – PPO | Attending: Hematology | Admitting: Hematology

## 2023-02-03 ENCOUNTER — Encounter: Payer: Self-pay | Admitting: Hematology

## 2023-02-03 ENCOUNTER — Inpatient Hospital Stay: Payer: BC Managed Care – PPO

## 2023-02-03 DIAGNOSIS — Z8051 Family history of malignant neoplasm of kidney: Secondary | ICD-10-CM | POA: Diagnosis not present

## 2023-02-03 DIAGNOSIS — G08 Intracranial and intraspinal phlebitis and thrombophlebitis: Secondary | ICD-10-CM | POA: Diagnosis present

## 2023-02-03 DIAGNOSIS — Z801 Family history of malignant neoplasm of trachea, bronchus and lung: Secondary | ICD-10-CM | POA: Diagnosis not present

## 2023-02-03 DIAGNOSIS — Z7901 Long term (current) use of anticoagulants: Secondary | ICD-10-CM | POA: Diagnosis not present

## 2023-02-03 DIAGNOSIS — Z87891 Personal history of nicotine dependence: Secondary | ICD-10-CM

## 2023-02-03 DIAGNOSIS — I2694 Multiple subsegmental pulmonary emboli without acute cor pulmonale: Secondary | ICD-10-CM

## 2023-02-03 DIAGNOSIS — Z800 Family history of malignant neoplasm of digestive organs: Secondary | ICD-10-CM | POA: Diagnosis not present

## 2023-02-03 DIAGNOSIS — I2699 Other pulmonary embolism without acute cor pulmonale: Secondary | ICD-10-CM | POA: Diagnosis not present

## 2023-02-03 DIAGNOSIS — Z803 Family history of malignant neoplasm of breast: Secondary | ICD-10-CM

## 2023-02-03 LAB — D-DIMER, QUANTITATIVE: D-Dimer, Quant: 1.91 ug/mL-FEU — ABNORMAL HIGH (ref 0.00–0.50)

## 2023-02-03 LAB — ANTITHROMBIN III: AntiThromb III Func: 100 % (ref 75–120)

## 2023-02-03 NOTE — Patient Instructions (Signed)
You were seen and examined today by Dr. Ellin Saba. Dr. Ellin Saba is a hematologist, meaning that he specializes in blood abnormalities. Dr. Ellin Saba discussed your past medical history, family history of cancers/blood conditions and the events that led to you being here today.  You were referred to Dr. Ellin Saba due to blood clots in your lung and brain.  Dr. Ellin Saba has recommended additional labs today for further evaluation.  Follow-up as scheduled.

## 2023-02-04 LAB — PROTEIN S, TOTAL AND FREE
Protein S Ag, Free: 99 % (ref 61–136)
Protein S Ag, Total: 84 % (ref 60–150)

## 2023-02-04 LAB — PROTEIN S ACTIVITY: Protein S Activity: 112 % (ref 63–140)

## 2023-02-04 LAB — BETA-2-GLYCOPROTEIN I ABS, IGG/M/A
Beta-2 Glyco I IgG: <9 GPI IgG units (ref 0–20)
Beta-2-Glycoprotein I IgA: <9 GPI IgA units (ref 0–25)
Beta-2-Glycoprotein I IgM: <9 GPI IgM units (ref 0–32)

## 2023-02-04 LAB — LUPUS ANTICOAGULANT PANEL
DRVVT: 53.9 s — ABNORMAL HIGH (ref 0.0–47.0)
PTT Lupus Anticoagulant: 36.8 s (ref 0.0–43.5)

## 2023-02-04 LAB — DRVVT CONFIRM: dRVVT Confirm: 0.9 ratio (ref 0.8–1.2)

## 2023-02-04 LAB — PROTEIN C ACTIVITY: Protein C Activity: 117 % (ref 73–180)

## 2023-02-04 LAB — DRVVT MIX: dRVVT Mix: 45.7 s — ABNORMAL HIGH (ref 0.0–40.4)

## 2023-02-05 ENCOUNTER — Ambulatory Visit: Payer: BC Managed Care – PPO | Admitting: Internal Medicine

## 2023-02-05 LAB — CARDIOLIPIN ANTIBODIES, IGG, IGM, IGA
Anticardiolipin IgA: <9 APL U/mL (ref 0–11)
Anticardiolipin IgG: <9 GPL U/mL (ref 0–14)
Anticardiolipin IgM: <9 MPL U/mL (ref 0–12)

## 2023-02-05 LAB — PROTEIN C, TOTAL: Protein C, Total: 98 % (ref 60–150)

## 2023-02-10 LAB — PNH PROFILE (-HIGH SENSITIVITY)

## 2023-02-13 LAB — JAK2 V617F RFX CALR/MPL/E12-15

## 2023-02-13 LAB — CALR +MPL + E12-E15  (REFLEX)

## 2023-02-14 LAB — PROTHROMBIN GENE MUTATION

## 2023-02-19 ENCOUNTER — Ambulatory Visit: Payer: BC Managed Care – PPO | Admitting: Internal Medicine

## 2023-02-19 LAB — FACTOR 5 LEIDEN

## 2023-03-05 ENCOUNTER — Inpatient Hospital Stay (HOSPITAL_BASED_OUTPATIENT_CLINIC_OR_DEPARTMENT_OTHER): Payer: BC Managed Care – PPO | Admitting: Hematology

## 2023-03-05 VITALS — BP 139/86 | HR 72 | Temp 98.2°F | Resp 18 | Ht 69.0 in | Wt 166.0 lb

## 2023-03-05 DIAGNOSIS — I2694 Multiple subsegmental pulmonary emboli without acute cor pulmonale: Secondary | ICD-10-CM | POA: Diagnosis not present

## 2023-03-05 DIAGNOSIS — G08 Intracranial and intraspinal phlebitis and thrombophlebitis: Secondary | ICD-10-CM | POA: Diagnosis not present

## 2023-03-05 MED ORDER — APIXABAN 5 MG PO TABS: 5.0000 mg | ORAL_TABLET | Freq: Two times a day (BID) | ORAL | 5 refills | Status: AC

## 2023-03-05 NOTE — Progress Notes (Signed)
[facility] [address], [place] [address]   Clinic Day:  03/05/2023  Referring physician: Elfredia Nevins, MD  Patient Care Team: Elfredia Nevins, MD as PCP - General (Internal Medicine)   ASSESSMENT & PLAN:   Assessment:  1.  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  He was also found to be hyponatremic.  He was reportedly sick with vomiting and decreased appetite and decreased activity for a few days prior to presentation to the ER. - CT head without contrast (01/07/2023): Relatively dense appearance of the dural venous sinuses, particularly right transverse/sigmoid sinus. - MRI brain (01/08/2023): No infarction.  Chronic small vessel ischemic changes.  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  Lesser nonocclusive thrombus within the left transverse and sigmoid venous sinuses.  Some superficial venous thrombosis over the parietal convexities as well. - CT CAP (01/10/2023): Acute pulmonary embolism with moderate embolic burden with no evidence of right heart strain.  No evidence of malignancy. - No prior history of thrombosis.  Last colonoscopy in 2008.  He lost about 10-15 pounds in the last 1 year.  2.  Social/family history: -Lives at home with his wife.  He retired after working in the [employer] at [employer] for 28 years.  Quit smoking on 01/07/2023.  Smoked 2-1/2 pack/day, started at age 70.  He had routine use of Roundup exposure for 20 years.  Also had exposure to diquat for couple of years. - Older sister had kidney cancer.  Younger sister had breast cancer.  4-5 maternal uncles had colon cancer.  Few maternal cousins also had colon cancer.  Another maternal cousin had cancer in the back.  Plan:  1.  Cerebral venous sinus thrombosis  and pulmonary embolism: - He continues to have bitemporal headaches on and off. - Thrombotic episodes most likely unprovoked/weakly provoked. - I reviewed hypercoagulable testing which was negative for lupus anticoagulant, PNH panel, protein C, protein S, Antithrombin III, JAK2 V617F and reflex testing. - Due to unprovoked nature of the cerebral venous sinus thrombosis, recommend anticoagulation at least for a year and reevaluate.  Will consider repeating brain MRI after 1 year of Eliquis. - Recommend follow-up in 6 months with D-dimer.   Orders Placed This Encounter  Procedures   D-dimer, quantitative    Standing Status:   Future    Standing Expiration Date:   03/04/2024      Mikeal Hawthorne R Teague,acting as a scribe for Doreatha Massed, MD.,have documented all relevant documentation on the behalf of Doreatha Massed, MD,as directed by  Doreatha Massed, MD while in the presence of Doreatha Massed, MD.  I, Doreatha Massed MD, have reviewed the above documentation for accuracy and completeness, and I agree with the above.    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 male presenting to clinic today for evaluation of hypercoagulable state at the request of Dr. Marland Mcalpine (hospitalist).  He presented to the ED on 01/07/23 with confusion after being found on the floor by his wife. He was subsequently admitted for hyponatremia and confusion. Brain MRI the following day reveled venous sinus thrombosis-- extensive thrombosis of superior sagittal sinus  and right transverse sinus extending into right internal jugular vein, as well as nonocclusive thrombus in left transverse and sigmoid sinuses. Thus, he was started on heparin. A CT C/A/P showed an incidental PE but no evidence of malignancy. He was  discharged on apixaban.  Today, he states that he is doing well overall. His appetite level is at 100%. His energy level is at 50%.  INTERVAL HISTORY:   Isaac Rose is a 62 y.o. 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 He was last seen by me on 02/03/23 in consultation.  Today, he states that he is doing well overall. His appetite level is at 70%. His energy level is at 40%.  He c/o a burning sensation in the feet. He also notes urinary frequency that is not new. He reports his headaches have slightly improved since his last visit. He is taking Eliquis as prescribed without any issues. He notes occasional tingling on the right side of the back of his head. He reports multiple incidences of lying down at night and having severe headaches, though only one incidence was recent.   PAST MEDICAL HISTORY:   Past Medical History: Past Medical History:  Diagnosis Date   Chronic constipation    COPD (chronic obstructive pulmonary disease) (HCC)    Depression    GERD (gastroesophageal reflux disease)    Headache    Hypertension     Surgical History: Past Surgical History:  Procedure Laterality Date   APPENDECTOMY     COLONOSCOPY  08   NUR   head trauma     from trauma   UPPER GASTROINTESTINAL ENDOSCOPY      Social History: Social History   Socioeconomic History   Marital status: Legally Separated    Spouse name: Not on file   Number of children: Not on file   Years of education: Not on file   Highest education level: Not on file  Occupational History   Not on file  Tobacco Use   Smoking status: Every Day    Current packs/day: 3.00    Average packs/day: 3.0 packs/day for 52.6 years (157.7 ttl pk-yrs)    Types: Cigarettes    Start date: 53   Smokeless tobacco: Former    Types: Chew    Quit date: 04/29/1987   Tobacco comments:    1 1/2 pack a day 34 yrs patient is aware he needs to quit   Vaping Use    Vaping status: Never Used  Substance and Sexual Activity   Alcohol use: Yes    Alcohol/week: 0.0 standard drinks of alcohol    Comment: occasionally   Drug use: No   Sexual activity: Never    Partners: Female  Other Topics Concern   Not on file  Social History Narrative   Not on file   Social Determinants of Health   Financial Resource Strain: Not on file  Food Insecurity: No Food Insecurity (02/03/2023)   Hunger Vital Sign    Worried About Running Out of Food in the Last Year: Never true    Ran Out of Food in the Last Year: Never true  Transportation Needs: No Transportation Needs (02/03/2023)   PRAPARE - Administrator, Civil Service (Medical): No    Lack of Transportation (Non-Medical): No  Physical Activity: Not on file  Stress: Not on file  Social Connections: Not on file  Intimate Partner Violence: Not At Risk (02/03/2023)   Humiliation,  Afraid, Rape, and Kick questionnaire    Fear of Current or Ex-Partner: No    Emotionally Abused: No    Physically Abused: No    Sexually Abused: No    Family History: Family History  Problem Relation Age of Onset   Lung cancer Mother    Emphysema Father    Kidney cancer Sister    Heart disease Brother    Aneurysm Sister    Breast cancer Sister    Colon cancer Maternal Aunt    Colon cancer Maternal Uncle        x4    Current Medications:  Current Outpatient Medications:    acetaminophen (TYLENOL) 500 MG tablet, Take 500-1,000 mg by mouth daily as needed for mild pain or headache., Disp: , Rfl:    albuterol (VENTOLIN HFA) 108 (90 Base) MCG/ACT inhaler, Inhale 2 puffs into the lungs every 4 (four) hours as needed for shortness of breath., Disp: 6.7 g, Rfl: 0   alum & mag hydroxide-simeth (MAALOX PLUS) 400-400-40 MG/5ML suspension, Take 15 mLs by mouth every 6 (six) hours as needed for indigestion., Disp: 355 mL, Rfl: 0   amLODipine (NORVASC) 10 MG tablet, Take 10 mg by mouth daily., Disp: , Rfl: 0   apixaban  (ELIQUIS) 5 MG TABS tablet, Take 1 tablet (5 mg total) by mouth 2 (two) times daily., Disp: 60 tablet, Rfl: 5   cyanocobalamin 1000 MCG tablet, Take 1 tablet (1,000 mcg total) by mouth daily., Disp: 30 tablet, Rfl: 0   docusate sodium (COLACE) 100 MG capsule, Take 1 capsule (100 mg total) by mouth 2 (two) times daily as needed for mild constipation., Disp: 100 capsule, Rfl: 0   famotidine (PEPCID) 20 MG tablet, Take 1 tablet (20 mg total) by mouth daily., Disp: 30 tablet, Rfl: 0   feeding supplement (ENSURE ENLIVE / ENSURE PLUS) LIQD, Take 237 mLs by mouth 3 (three) times daily between meals., Disp: 237 mL, Rfl: 12   folic acid (FOLVITE) 1 MG tablet, Take 1 tablet (1 mg total) by mouth daily., Disp: 30 tablet, Rfl: 0   labetalol (NORMODYNE) 300 MG tablet, Take 300 mg by mouth 2 (two) times daily., Disp: , Rfl:    losartan (COZAAR) 50 MG tablet, Take 1 tablet (50 mg total) by mouth daily., Disp: 30 tablet, Rfl: 0   Multiple Vitamin (MULTIVITAMIN WITH MINERALS) TABS tablet, Take 1 tablet by mouth daily., Disp: 130 tablet, Rfl: 0   pantoprazole (PROTONIX) 40 MG tablet, Take 1 tablet (40 mg total) by mouth 2 (two) times daily., Disp: 60 tablet, Rfl: 0   polyethylene glycol powder (GLYCOLAX/MIRALAX) 17 GM/SCOOP powder, Take 1 capful (17 g) by mouth daily as needed for moderate constipation., Disp: 238 g, Rfl: 0   pyridOXINE (B-6) 50 MG tablet, Take 1 tablet (50 mg total) by mouth daily for 30 days then as directed by MD, Disp: 100 tablet, Rfl: 0   senna-docusate (SENOKOT-S) 8.6-50 MG tablet, Take 1 tablet by mouth daily., Disp: 30 tablet, Rfl: 0   thiamine (VITAMIN B1) 100 MG tablet, Take 1 tablet (100 mg total) by mouth daily., Disp: 30 tablet, Rfl: 0   umeclidinium-vilanterol (ANORO ELLIPTA) 62.5-25 MCG/ACT AEPB, Inhale 1 puff into the lungs daily., Disp: 60 each, Rfl: 0   Allergies: Allergies  Allergen Reactions   Effexor [Venlafaxine Hydrochloride] Other (See Comments)    unknown    REVIEW OF  SYSTEMS:   Review of Systems  Constitutional:  Negative for chills, fatigue and fever.  HENT:  Negative for lump/mass, mouth sores, nosebleeds, sore throat and trouble swallowing.   Eyes:  Negative for eye problems.  Respiratory:  Negative for cough and shortness of breath.   Cardiovascular:  Negative for chest pain, leg swelling and palpitations.  Gastrointestinal:  Positive for abdominal pain (left side, 4/10 severity). Negative for constipation, diarrhea, nausea and vomiting.  Genitourinary:  Positive for frequency (at night). Negative for bladder incontinence, difficulty urinating, dysuria, hematuria and nocturia.   Musculoskeletal:  Negative for arthralgias, back pain, flank pain, myalgias and neck pain.  Skin:  Negative for itching and rash.  Neurological:  Positive for headaches and numbness (burning sensation in feet). Negative for dizziness.  Hematological:  Does not bruise/bleed easily.  Psychiatric/Behavioral:  Positive for sleep disturbance. Negative for depression and suicidal ideas. The patient is not nervous/anxious.   All other systems reviewed and are negative.    VITALS:   Blood pressure 139/86, pulse 72, temperature 98.2 F (36.8 C), temperature source Oral, resp. rate 18, height 5\' 9"  (1.753 m), weight 166 lb (75.3 kg), SpO2 100%.  Wt Readings from Last 3 Encounters:  03/05/23 166 lb (75.3 kg)  01/12/23 143 lb 1.3 oz (64.9 kg)  03/17/19 153 lb 6.4 oz (69.6 kg)    Body mass index is 24.51 kg/m.   PHYSICAL EXAM:   Physical Exam Vitals and nursing note reviewed. Exam conducted with a chaperone present.  Constitutional:      Appearance: Normal appearance.  Cardiovascular:     Rate and Rhythm: Normal rate and regular rhythm.     Pulses: Normal pulses.     Heart sounds: Normal heart sounds.  Pulmonary:     Effort: Pulmonary effort is normal.     Breath sounds: Normal breath sounds.  Abdominal:     Palpations: Abdomen is soft. There is no hepatomegaly,  splenomegaly or mass.     Tenderness: There is no abdominal tenderness.  Musculoskeletal:     Right lower leg: No edema.     Left lower leg: No edema.  Lymphadenopathy:     Cervical: No cervical adenopathy.     Right cervical: No superficial, deep or posterior cervical adenopathy.    Left cervical: No superficial, deep or posterior cervical adenopathy.     Upper Body:     Right upper body: No supraclavicular or axillary adenopathy.     Left upper body: No supraclavicular or axillary adenopathy.  Neurological:     General: No focal deficit present.     Mental Status: He is alert and oriented to person, place, and time.  Psychiatric:        Mood and Affect: Mood normal.        Behavior: Behavior normal.     LABS:      Latest Ref Rng & Units 01/16/2023   11:51 AM 01/15/2023    3:15 AM 01/14/2023    4:45 AM  CBC  WBC 4.0 - 10.5 K/uL 9.2  8.4  8.3   Hemoglobin 13.0 - 17.0 g/dL 16.1  09.6  04.5   Hematocrit 39.0 - 52.0 % 37.0  36.4  37.7   Platelets 150 - 400 K/uL 433  381  367       Latest Ref Rng & Units 01/16/2023   11:51 AM 01/15/2023   10:24 AM 01/14/2023    4:45 AM  CMP  Glucose 70 - 99 mg/dL 87  80  91   BUN 8 - 23 mg/dL 6  10  12    Creatinine  0.61 - 1.24 mg/dL 1.61  0.96  0.45   Sodium 135 - 145 mmol/L 131  127  127   Potassium 3.5 - 5.1 mmol/L 4.2  4.2  4.2   Chloride 98 - 111 mmol/L 99  94  91   CO2 22 - 32 mmol/L 24  24  27    Calcium 8.9 - 10.3 mg/dL 8.4  8.5  8.9   Total Protein 6.5 - 8.1 g/dL 6.4  6.5  6.6   Total Bilirubin 0.3 - 1.2 mg/dL 0.9  1.0  1.1   Alkaline Phos 38 - 126 U/L 79  78  76   AST 15 - 41 U/L 22  24  23    ALT 0 - 44 U/L 15  15  14       No results found for: "CEA1", "CEA" / No results found for: "CEA1", "CEA" No results found for: "PSA1" No results found for: "WUJ811" No results found for: "CAN125"  No results found for: "TOTALPROTELP", "ALBUMINELP", "A1GS", "A2GS", "BETS", "BETA2SER", "GAMS", "MSPIKE", "SPEI" No results found for:  "TIBC", "FERRITIN", "IRONPCTSAT" No results found for: "LDH"   STUDIES:   No results found.

## 2023-03-05 NOTE — Patient Instructions (Signed)
[facility]  Discharge Instructions  You were seen and examined today by Dr. Ellin Saba.  Dr. Ellin Saba discussed your most recent lab work which revealed that everything looks good.  Continue taking the Eliquis twice daily. Let us know if you have any issues getting the Eliquis or side effects.  Follow-up as scheduled in 6 months.    Thank you for choosing [facility] to provide your oncology and hematology care.   To afford each patient quality time with our provider, please arrive at least 15 minutes before your scheduled appointment time. You may need to reschedule your appointment if you arrive late (10 or more minutes). Arriving late affects you and other patients whose appointments are after yours.  Also, if you miss three or more appointments without notifying the office, you may be dismissed from the clinic at the provider's discretion.    Again, thank you for choosing [facility]  Our hope is that these requests will decrease the amount of time that you wait before being seen by our physicians.   If you have a lab appointment with the Cancer Center - please note that after April 8th, all labs will be drawn in the cancer center.  You do not have to check in or register with the main entrance as you have in the past but will complete your check-in at the cancer center.            _____________________________________________________________  Should you have questions after your visit to [facility], please contact our office at [phone] and follow the prompts.  Our office hours are 8:00 a.m. to 4:30 p.m. Monday - Thursday and 8:00 a.m. to 2:30 p.m. Friday.  Please note that voicemails left after 4:00 p.m. may not be returned until the following business day.  We are closed weekends and all major holidays.  You do have access to a nurse 24-7, just call the main number to the clinic [phone] and  do not press any options, hold on the line and a nurse will answer the phone.    For prescription refill requests, have your pharmacy contact our office and allow 72 hours.    Masks are no longer required in the cancer centers. If you would like for your care team to wear a mask while they are taking care of you, please let them know. You may have one support person who is at least 63 years old accompany you for your appointments.

## 2023-03-06 ENCOUNTER — Ambulatory Visit: Payer: BC Managed Care – PPO | Admitting: Internal Medicine

## 2023-03-12 ENCOUNTER — Encounter: Payer: Self-pay | Admitting: Internal Medicine

## 2023-03-12 ENCOUNTER — Ambulatory Visit (INDEPENDENT_AMBULATORY_CARE_PROVIDER_SITE_OTHER): Payer: BC Managed Care – PPO | Admitting: Internal Medicine

## 2023-03-12 VITALS — BP 133/81 | HR 76 | Temp 97.8°F | Ht 69.0 in | Wt 167.3 lb

## 2023-03-12 DIAGNOSIS — D122 Benign neoplasm of ascending colon: Secondary | ICD-10-CM | POA: Diagnosis not present

## 2023-03-12 DIAGNOSIS — R634 Abnormal weight loss: Secondary | ICD-10-CM

## 2023-03-12 DIAGNOSIS — K5904 Chronic idiopathic constipation: Secondary | ICD-10-CM

## 2023-03-12 DIAGNOSIS — K219 Gastro-esophageal reflux disease without esophagitis: Secondary | ICD-10-CM

## 2023-03-12 NOTE — Patient Instructions (Signed)
Continue on pantoprazole for your chronic reflux.  Continue on your stool softener for constipation.  We will readdress possibility of colonoscopy in 3 to 4 months when you follow back up with [place].  We will continue to monitor your weight.  It was very nice meeting you today.  Dr. Marletta Lor

## 2023-03-12 NOTE — Progress Notes (Signed)
Primary Care Physician:  Elfredia Nevins, MD Primary Gastroenterologist:  Dr. Marletta Lor  Chief Complaint  Patient presents with   New Patient (Initial Visit)    Patient here today due to having weight loss. Patient says he has eats usually one meal per day, but snacks through out the day. Patient had a recent hospitalization at [facility] around 01/07/2023, which he had several health issues at that time that included weight loss and constipation. Patient says he is no longer having issues with constipation. He says he is taking an orange pill for constipation, name unknown and this has controlled the constipation.     HPI:   Isaac Rose is a 62 y.o. male who presents to the clinic today for evaluation.  Patient was admitted to hospital 01/07/2023 for over a week after initially presenting with metabolic encephalopathy and hyponatremia.  Found to have pulmonary embolism with moderate clot burden as well as cerebral venous sinus thrombosis.  Has seen hematology and workup for underlying hypercoagulable state has been negative.  Recommending Eliquis for at least 1 year.  From a GI standpoint, patient complains of:  GERD: Chronic, has been going on for many years.  Was changed to pantoprazole 40 mg twice daily during his hospitalization and states this is much better.  No dysphagia odynophagia.  No epigastric or chest pain.  No melena hematochezia.  Adenomatous colon polyps: Last colonoscopy 2008 with 2 tubular adenomas removed from his ascending colon.  Also notes family history of colon cancer and multiple uncles.  Due for surveillance today.  Weight loss: States his weight got as low as 155.  He is already gained 12 pounds since his hospitalization.  States his appetite is good.  Does not eat 3 true meals a day as he prefers to "snack" all day.  Constipation: Mild, chronic, well-controlled on daily stool softener.  He is unsure which one he takes.  Possibly Colace.  Past Medical History:   Diagnosis Date   Chronic constipation    COPD (chronic obstructive pulmonary disease) (HCC)    Depression    GERD (gastroesophageal reflux disease)    Headache    Hypertension     Past Surgical History:  Procedure Laterality Date   APPENDECTOMY     COLONOSCOPY  08   NUR   head trauma     from trauma   UPPER GASTROINTESTINAL ENDOSCOPY      Current Outpatient Medications  Medication Sig Dispense Refill   acetaminophen (TYLENOL) 500 MG tablet Take 500-1,000 mg by mouth daily as needed for mild pain or headache.     albuterol (VENTOLIN HFA) 108 (90 Base) MCG/ACT inhaler Inhale 2 puffs into the lungs every 4 (four) hours as needed for shortness of breath. 6.7 g 0   amLODipine (NORVASC) 10 MG tablet Take 10 mg by mouth daily.  0   apixaban (ELIQUIS) 5 MG TABS tablet Take 1 tablet (5 mg total) by mouth 2 (two) times daily. 60 tablet 5   cyanocobalamin 1000 MCG tablet Take 1 tablet (1,000 mcg total) by mouth daily. 30 tablet 0   famotidine (PEPCID) 20 MG tablet Take 1 tablet (20 mg total) by mouth daily. 30 tablet 0   feeding supplement (ENSURE ENLIVE / ENSURE PLUS) LIQD Take 237 mLs by mouth 3 (three) times daily between meals. 237 mL 12   folic acid (FOLVITE) 1 MG tablet Take 1 tablet (1 mg total) by mouth daily. 30 tablet 0   labetalol (NORMODYNE) 300 MG tablet  Take 300 mg by mouth 2 (two) times daily.     losartan (COZAAR) 50 MG tablet Take 1 tablet (50 mg total) by mouth daily. 30 tablet 0   OVER THE COUNTER MEDICATION CeraVite/ Antioxidant daily     pantoprazole (PROTONIX) 40 MG tablet Take 1 tablet (40 mg total) by mouth 2 (two) times daily. 60 tablet 0   pyridOXINE (B-6) 50 MG tablet Take 1 tablet (50 mg total) by mouth daily for 30 days then as directed by MD 100 tablet 0   thiamine (VITAMIN B1) 100 MG tablet Take 1 tablet (100 mg total) by mouth daily. 30 tablet 0   umeclidinium-vilanterol (ANORO ELLIPTA) 62.5-25 MCG/ACT AEPB Inhale 1 puff into the lungs daily. 60 each 0    docusate sodium (COLACE) 100 MG capsule Take 1 capsule (100 mg total) by mouth 2 (two) times daily as needed for mild constipation. (Patient not taking: Reported on 03/12/2023) 100 capsule 0   No current facility-administered medications for this visit.    Allergies as of 03/12/2023 - Review Complete 03/12/2023  Allergen Reaction Noted   Effexor [venlafaxine hydrochloride] Other (See Comments) 03/05/2011    Family History  Problem Relation Age of Onset   Lung cancer Mother    Emphysema Father    Kidney cancer Sister    Heart disease Brother    Aneurysm Sister    Breast cancer Sister    Colon cancer Maternal Aunt    Colon cancer Maternal Uncle        x4    Social History   Socioeconomic History   Marital status: Legally Separated    Spouse name: Not on file   Number of children: Not on file   Years of education: Not on file   Highest education level: Not on file  Occupational History   Not on file  Tobacco Use   Smoking status: Every Day    Current packs/day: 3.00    Average packs/day: 3.0 packs/day for 52.6 years (157.8 ttl pk-yrs)    Types: Cigarettes    Start date: 47   Smokeless tobacco: Former    Types: Chew    Quit date: 04/29/1987   Tobacco comments:    1 1/2 pack a day 34 yrs patient is aware he needs to quit   Vaping Use   Vaping status: Never Used  Substance and Sexual Activity   Alcohol use: Yes    Alcohol/week: 0.0 standard drinks of alcohol    Comment: occasionally   Drug use: No   Sexual activity: Never    Partners: Female  Other Topics Concern   Not on file  Social History Narrative   Not on file   Social Determinants of Health   Financial Resource Strain: Not on file  Food Insecurity: No Food Insecurity (02/03/2023)   Hunger Vital Sign    Worried About Running Out of Food in the Last Year: Never true    Ran Out of Food in the Last Year: Never true  Transportation Needs: No Transportation Needs (02/03/2023)   PRAPARE - Doctor, general practice (Medical): No    Lack of Transportation (Non-Medical): No  Physical Activity: Not on file  Stress: Not on file  Social Connections: Not on file  Intimate Partner Violence: Not At Risk (02/03/2023)   Humiliation, Afraid, Rape, and Kick questionnaire    Fear of Current or Ex-Partner: No    Emotionally Abused: No    Physically Abused: No  Sexually Abused: No    Subjective: Review of Systems  Constitutional:  Negative for chills and fever.  HENT:  Negative for congestion and hearing loss.   Eyes:  Negative for blurred vision and double vision.  Respiratory:  Negative for cough and shortness of breath.   Cardiovascular:  Negative for chest pain and palpitations.  Gastrointestinal:  Positive for constipation and heartburn. Negative for abdominal pain, blood in stool, diarrhea, melena and vomiting.  Genitourinary:  Negative for dysuria and urgency.  Musculoskeletal:  Negative for joint pain and myalgias.  Skin:  Negative for itching and rash.  Neurological:  Negative for dizziness and headaches.  Psychiatric/Behavioral:  Negative for depression. The patient is not nervous/anxious.        Objective: BP 133/81 (BP Location: Left Arm, Patient Position: Sitting, Cuff Size: Large)   Pulse 76   Temp 97.8 F (36.6 C) (Temporal)   Ht 5\' 9"  (1.753 m)   Wt 167 lb 4.8 oz (75.9 kg)   BMI 24.71 kg/m  Physical Exam Constitutional:      Appearance: Normal appearance.  HENT:     Head: Normocephalic and atraumatic.  Eyes:     Extraocular Movements: Extraocular movements intact.     Conjunctiva/sclera: Conjunctivae normal.  Cardiovascular:     Rate and Rhythm: Normal rate and regular rhythm.  Pulmonary:     Effort: Pulmonary effort is normal.     Breath sounds: Normal breath sounds.  Abdominal:     General: Bowel sounds are normal.     Palpations: Abdomen is soft.  Musculoskeletal:        General: Normal range of motion.     Cervical back: Normal range of motion and  neck supple.  Skin:    General: Skin is warm.  Neurological:     General: No focal deficit present.     Mental Status: He is alert and oriented to person, place, and time.  Psychiatric:        Mood and Affect: Mood normal.        Behavior: Behavior normal.      Assessment/Plan:  1.  Chronic GERD-improved on pantoprazole twice daily.  We will continue.  2.  Adenomatous colon polyps-patient with 2 tubular adenomas removed on colonoscopy 2008.  Due for surveillance today.  Also with family history of colon cancer and multiple uncles.  Given his recent VTE/PE we will hold off for now.  Readdress on follow-up visit.  Patient will need to hold his Eliquis x 2 days prior to procedure.  3.  Weight loss-this is improved, has already gained 12 pounds since hospitalization.  States his appetite is good.  Continue to monitor.  4.  Constipation-mild, improved on stool softener though he is unsure which one he takes. ?Colace, told to continue this.  Follow-up in 3 to 4 months 03/12/2023 2:06 PM   Disclaimer: This note was dictated with voice recognition software. Similar sounding words can inadvertently be transcribed and may not be corrected upon review.

## 2023-03-17 ENCOUNTER — Ambulatory Visit: Payer: BC Managed Care – PPO | Admitting: Adult Health

## 2023-03-17 ENCOUNTER — Encounter: Payer: Self-pay | Admitting: Adult Health

## 2023-03-17 VITALS — BP 134/84 | HR 63 | Ht 69.0 in | Wt 166.0 lb

## 2023-03-17 DIAGNOSIS — E871 Hypo-osmolality and hyponatremia: Secondary | ICD-10-CM | POA: Diagnosis not present

## 2023-03-17 DIAGNOSIS — E7211 Homocystinuria: Secondary | ICD-10-CM

## 2023-03-17 DIAGNOSIS — E538 Deficiency of other specified B group vitamins: Secondary | ICD-10-CM | POA: Diagnosis not present

## 2023-03-17 NOTE — Patient Instructions (Addendum)
Continue  Eliquis 5mg  twice daily  for secondary stroke prevention - continue to follow with hematology as advised  We will check lab work today - you will be notified via MyChart tomorrow with the results and any further recommendations  If you are interested in starting a daily medication to help with headaches and numbness/tingling, please let me know.   If vision worsens or does not improve, would recommend evaluation by ophthalmologist   I will keep you updated regarding recommendations on repeat imaging  Would recommend complete tobacco cessation as continued use greatly increases risk of additional strokes, heart disease and other health related concerns  Continue to follow up with PCP regarding blood pressure management  Maintain strict control of hypertension with blood pressure goal below 130/90  Signs of a Stroke? Follow the BEFAST method:  Balance Watch for a sudden loss of balance, trouble with coordination or vertigo Eyes Is there a sudden loss of vision in one or both eyes? Or double vision?  Face: Ask the person to smile. Does one side of the face droop or is it numb?  Arms: Ask the person to raise both arms. Does one arm drift downward? Is there weakness or numbness of a leg? Speech: Ask the person to repeat a simple phrase. Does the speech sound slurred/strange? Is the person confused ? Time: If you observe any of these signs, call 911.      Followup in the future with me in 4 months or call earlier if needed       Thank you for coming to see [place] at [facility]. I hope we have been able to provide you high quality care today.  You may receive a patient satisfaction survey over the next few weeks. We would appreciate your feedback and comments so that we may continue to improve ourselves and the health of our patients.

## 2023-03-17 NOTE — Progress Notes (Signed)
[facility] [address] [place]. [place] [address] [phone]       HOSPITAL FOLLOW UP NOTE  [name] Date of Birth:  01/07/1961 Medical Record Number:  [id number]   Reason for Referral:  hospital stroke follow up    SUBJECTIVE:   CHIEF COMPLAINT:  Chief Complaint  Patient presents with   Follow-up    Pt alone, rm 3, he states the right side of his head to posterior head is tingling. States the left side is starting to feel that way also but Right> left. States this is just localized to head.     HPI:   [name] with history of COPD, hypertension, tobacco abuse and GERD who originally presented with altered mental status and hyponatremia after being found down in his home on 01/12/2023.  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  CT chest showed incidental finding of acute pulmonary embolism and RLE age-indeterminate DVT. Initially placed on heparin drip and transitioned to Eliquis.  Hypercoagulable labs pending at discharge, referral placed to hematology for outpatient follow-up.  Homocystine 40.6, B12 107, started on folic acid and B12 supplement.  Work up for weight loss and malnutrition, recommended follow-up with GI and hematology/oncology.  Also evaluated by dietitian for supplement recommendations. Current tobacco use with cessation counseling provided.   Today, 03/17/2023, patient is being seen for initial hospital follow-up unaccompanied. Does report right>left head numbness/tingling present since hospitalization, can occur intermittently. Also reports occasional temporal and occipital headaches, about 3x per week, usually resolve with Tylenol, reports longstanding history of headaches and  migraines since childhood, denies worsening since recent hospitalization.  Has not had any recent migraine type headaches.  Not currently on preventative medications. At times can have difficulty seeing out of left eye, possibly blurred vision but difficult to explain, will resolve after 15 to 20 seconds, only occurred once last week. Also mentions bilateral bottom foot numbness with burning sensation at night, present prior to hospitalization.  Reports currently living alone, typically snacks on junk food during the day, will cook meals on occasion.  Denies any further weight loss since discharge, currently being followed by GI.  Denies any EtOH use over the past month.  Tobacco cessation for initial 2 weeks after hospitalization but unfortunately restarted and currently smoking 2 packs/day, prior 2-3 packs/day.  Continues on Eliquis without side effects. 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  Continued use of folic acid and B vitamins, denies recheck of these labs since discharge.       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  Bilateral ICA bulb and siphon atherosclerosis. 2D Echo EF 60 to 65%, normal left atrial size, no atrial level shunt LDL 50 HgbA1c 5.3    ROS:   14 system review of systems performed and negative with exception of those listed in  HPI  PMH:  Past Medical History:  Diagnosis Date   Chronic constipation    COPD (chronic obstructive pulmonary  disease) (HCC)    Depression    GERD (gastroesophageal reflux disease)    Headache    Hypertension     PSH:  Past Surgical History:  Procedure Laterality Date   APPENDECTOMY     COLONOSCOPY  08   NUR   head trauma     from trauma   UPPER GASTROINTESTINAL ENDOSCOPY      Social History:  Social History   Socioeconomic History   Marital status: Legally Separated    Spouse name: Not on file   Number of children: Not on file   Years of education: Not on file   Highest education level: Not on file  Occupational History   Not on file  Tobacco Use   Smoking status: Every Day    Current packs/day: 3.00    Average packs/day: 3.0 packs/day for 52.6 years (157.8 ttl pk-yrs)    Types: Cigarettes    Start date: 89   Smokeless tobacco: Former    Types: Chew    Quit date: 04/29/1987   Tobacco comments:    1 1/2 pack a day 35 yrs patient is aware he needs to quit   Vaping Use   Vaping status: Never Used  Substance and Sexual Activity   Alcohol use: Yes    Alcohol/week: 0.0 standard drinks of alcohol    Comment: occasionally   Drug use: No   Sexual activity: Never    Partners: Female  Other Topics Concern   Not on file  Social History Narrative   Not on file   Social Determinants of Health   Financial Resource Strain: Not on file  Food Insecurity: No Food Insecurity (02/03/2023)   Hunger Vital Sign    Worried About Running Out of Food in the Last Year: Never true    Ran Out of Food in the Last Year: Never true  Transportation Needs: No Transportation Needs (02/03/2023)   PRAPARE - Administrator, Civil Service (Medical): No    Lack of Transportation (Non-Medical): No  Physical Activity: Not on file  Stress: Not on file  Social Connections: Not on file  Intimate Partner Violence: Not At Risk (02/03/2023)   Humiliation, Afraid, Rape, and Kick questionnaire    Fear of Current Rose Ex-Partner: No    Emotionally Abused: No    Physically Abused: No    Sexually  Abused: No    Family History:  Family History  Problem Relation Age of Onset   Lung cancer Mother    Emphysema Father    Kidney cancer Sister    Heart disease Brother    Aneurysm Sister    Breast cancer Sister    Colon cancer Maternal Aunt    Colon cancer Maternal Uncle        x4    Medications:   Current Outpatient Medications on File Prior to Visit  Medication Sig Dispense Refill   acetaminophen (TYLENOL) 500 MG tablet Take 500-1,000 mg by mouth daily as needed for mild pain Rose headache.     albuterol (VENTOLIN HFA) 108 (90 Base) MCG/ACT inhaler Inhale 2 puffs into the lungs every 4 (four) hours as needed for shortness of breath. 6.7 g 0   amLODipine (NORVASC) 10 MG tablet Take 10 mg by mouth daily.  0   apixaban (ELIQUIS) 5 MG TABS tablet Take 1 tablet (5 mg total) by mouth  2 (two) times daily. 60 tablet 5   cyanocobalamin 1000 MCG tablet Take 1 tablet (1,000 mcg total) by mouth daily. 30 tablet 0   famotidine (PEPCID) 20 MG tablet Take 1 tablet (20 mg total) by mouth daily. 30 tablet 0   folic acid (FOLVITE) 1 MG tablet Take 1 tablet (1 mg total) by mouth daily. 30 tablet 0   labetalol (NORMODYNE) 300 MG tablet Take 300 mg by mouth 2 (two) times daily.     losartan (COZAAR) 50 MG tablet Take 1 tablet (50 mg total) by mouth daily. 30 tablet 0   OVER THE COUNTER MEDICATION CeraVite/ Antioxidant daily     pantoprazole (PROTONIX) 40 MG tablet Take 1 tablet (40 mg total) by mouth 2 (two) times daily. 60 tablet 0   pyridOXINE (B-6) 50 MG tablet Take 1 tablet (50 mg total) by mouth daily for 30 days then as directed by MD 100 tablet 0   thiamine (VITAMIN B1) 100 MG tablet Take 1 tablet (100 mg total) by mouth daily. 30 tablet 0   umeclidinium-vilanterol (ANORO ELLIPTA) 62.5-25 MCG/ACT AEPB Inhale 1 puff into the lungs daily. 60 each 0   No current facility-administered medications on file prior to visit.    Allergies:   Allergies  Allergen Reactions   Effexor [Venlafaxine  Hydrochloride] Other (See Comments)    unknown      OBJECTIVE:  Physical Exam  Vitals:   03/17/23 1007  BP: 134/84  Pulse: 63  Weight: 166 lb (75.3 kg)  Height: 5\' 9"  (1.753 m)   Body mass index is 24.51 kg/m. No results found.  General: well developed, well nourished, very pleasant middle-age Caucasian male, seated, in no evident distress Head: head normocephalic and atraumatic.   Neck: supple with no carotid Rose supraclavicular bruits Cardiovascular: regular rate and rhythm, no murmurs Musculoskeletal: no deformity Skin:  no rash/petichiae Vascular:  Normal pulses all extremities   Neurologic Exam Mental Status: Awake and fully alert.  Fluent speech and language.  Oriented to place and time. Recent and remote memory intact. Attention span, concentration and fund of knowledge appropriate. Mood and affect appropriate.  Cranial Nerves: Pupils equal, briskly reactive to light. Extraocular movements full without nystagmus. Visual fields full to confrontation. Hearing intact. Facial sensation intact. Face, tongue, palate moves normally and symmetrically.  Motor: Normal bulk and tone. Normal strength in all tested extremity muscles Sensory.: intact to touch , pinprick , position and vibratory sensation.  Coordination: Rapid alternating movements normal in all extremities. Finger-to-nose and heel-to-shin performed accurately bilaterally. Gait and Station: Arises from chair without difficulty. Stance is normal. Gait demonstrates normal stride length and balance without use of AD.  Reflexes: 1+ and symmetric. Toes downgoing.         ASSESSMENT: Isaac Rose is a 62 y.o. year old male who presented to ED on 01/07/2023 with altered mental status and hyponatremia after being found down, MRI negative for acute stroke but did show extensive thrombosis superior sagittal venous sinus and right transverse sinus extending into upper right internal jugular vein with additional thrombus in  the left transverse and sigmoid venous sinus secondary to unclear etiology.  Hospital course complicated by acute PE and age-indeterminate RLE DVT , hyperhomocysteinemia and B12 deficiency , severe hyponatremia and malnutrition.  Vascular risk factors include HTN, tobacco and EtOH use.      PLAN:  CVST :  Reports R>L head numbness intermittently and occasional headaches (but no worsening from baseline headaches with longstanding history since  childhood).  Declines interest in daily preventative medication but advised to call if interested in pursuing in the future Hypercoagulable labs negative Continue Eliquis 5mg  twice daily managed by hematology who recommends continuing for at least 1 year.   Will discuss with Dr. Pearlean Brownie when repeat imaging should be completed Discussed stroke prevention measures and importance of close PCP follow up for aggressive stroke risk factor management including BP goal<130/90 Stroke labs 01/2023: LDL 50, A1c 5.3 I have gone over the pathophysiology of stroke, warning signs and symptoms, risk factors and their management in some detail with instructions to go to the closest emergency room for symptoms of concern.  Hyperhomocysteinemia B12 deficiency Likely in setting of tobacco use and B12 deficiency Repeat levels today Continue B12 and FA supplement F/u with PCP for repeat lab work and ongoing management Suspect possibly contributing to BLE numbness but if symptoms persist Rose worsen, may need further evaluation  Tobacco use:  Discussed importance of complete tobacco cessation as continued use greatly increases risk of cardiovascular disease and other health related issues.  Advised him to follow-up with PCP if assistance is needed      Follow up in 4 months Rose call earlier if needed    CC:  [facility] provider: Dr. Pearlean Brownie PCP: Elfredia Nevins, MD    I spent 70 minutes of face-to-face and non-face-to-face time with patient.  This included previsit chart  review including review of recent hospitalization, lab review, study review, order entry, electronic health record documentation, patient education and discussion regarding above diagnoses and treatment plan and answered all other questions to patient satisfaction   Ihor Austin, [facility]  [facility] [address] [place], [place] [address]  Phone [phone] Fax [phone] Note: This document was prepared with digital dictation and possible smart phrase technology. Any transcriptional errors that result from this process are unintentional.

## 2023-03-18 ENCOUNTER — Encounter: Payer: Self-pay | Admitting: Adult Health

## 2023-03-18 ENCOUNTER — Other Ambulatory Visit: Payer: Self-pay | Admitting: Adult Health

## 2023-03-18 DIAGNOSIS — G08 Intracranial and intraspinal phlebitis and thrombophlebitis: Secondary | ICD-10-CM

## 2023-03-18 NOTE — Progress Notes (Signed)
I agree with the above plan 

## 2023-03-19 ENCOUNTER — Telehealth: Payer: Self-pay | Admitting: Adult Health

## 2023-03-19 ENCOUNTER — Encounter: Payer: Self-pay | Admitting: Neurology

## 2023-03-19 NOTE — Telephone Encounter (Signed)
His primary doctor or oncologist should follow-up regarding needed repeat imaging to reevaluate prior DVT and PE. Please advise patient.  Thank you.

## 2023-03-19 NOTE — Telephone Encounter (Signed)
I called the patient and got him scheduled for the MRI and MRV at our office for next week. He said that when he was in the hospital he had a blood clot in his right thigh and left lung and he asked how does he know if they are gone? I didn't see any other orders such as ultrasounds for him to schedule, so I said I would have a nurse give him a call back.

## 2023-03-25 ENCOUNTER — Ambulatory Visit (INDEPENDENT_AMBULATORY_CARE_PROVIDER_SITE_OTHER): Payer: BC Managed Care – PPO

## 2023-03-25 DIAGNOSIS — G08 Intracranial and intraspinal phlebitis and thrombophlebitis: Secondary | ICD-10-CM | POA: Diagnosis not present

## 2023-03-25 MED ORDER — GADOBENATE DIMEGLUMINE 529 MG/ML IV SOLN
15.0000 mL | Freq: Once | INTRAVENOUS | Status: AC | PRN
  Administered 2023-03-25: 15 mL via INTRAVENOUS

## 2023-03-26 ENCOUNTER — Ambulatory Visit (INDEPENDENT_AMBULATORY_CARE_PROVIDER_SITE_OTHER): Payer: BC Managed Care – PPO

## 2023-03-26 DIAGNOSIS — G08 Intracranial and intraspinal phlebitis and thrombophlebitis: Secondary | ICD-10-CM

## 2023-07-15 ENCOUNTER — Ambulatory Visit: Payer: BC Managed Care – PPO | Admitting: Gastroenterology

## 2023-07-22 ENCOUNTER — Ambulatory Visit (INDEPENDENT_AMBULATORY_CARE_PROVIDER_SITE_OTHER): Payer: BC Managed Care – PPO | Admitting: Adult Health

## 2023-07-22 ENCOUNTER — Encounter: Payer: Self-pay | Admitting: Adult Health

## 2023-07-22 VITALS — BP 129/78 | HR 64 | Ht 69.0 in | Wt 182.8 lb

## 2023-07-22 DIAGNOSIS — G08 Intracranial and intraspinal phlebitis and thrombophlebitis: Secondary | ICD-10-CM | POA: Diagnosis not present

## 2023-07-22 DIAGNOSIS — M792 Neuralgia and neuritis, unspecified: Secondary | ICD-10-CM

## 2023-07-22 DIAGNOSIS — G629 Polyneuropathy, unspecified: Secondary | ICD-10-CM | POA: Diagnosis not present

## 2023-07-22 DIAGNOSIS — M545 Low back pain, unspecified: Secondary | ICD-10-CM

## 2023-07-22 MED ORDER — METHYLPREDNISOLONE 4 MG PO TBPK
ORAL_TABLET | ORAL | 0 refills | Status: DC
Start: 2023-07-22 — End: 2024-05-28

## 2023-07-22 MED ORDER — GABAPENTIN 300 MG PO CAPS
300.0000 mg | ORAL_CAPSULE | Freq: Two times a day (BID) | ORAL | 11 refills | Status: AC
Start: 2023-07-22 — End: ?

## 2023-07-22 NOTE — Progress Notes (Signed)
[facility] [address] [place]. [place] [address] [phone]       OFFICE FOLLOW UP NOTE  [name] Date of Birth:  20-Aug-1960 Medical Record Number:  [id number]    Primary neurologist: Dr. Pearlean Brownie Reason for visit: CVST follow up    SUBJECTIVE:   HPI:   Update 07/22/2023 JM: Patient returns for follow-up visit unaccompanied.   He reports continued bilateral foot neuropathy, worse at night, can interfere with his sleep. Denies any worsening. 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 No hx of diabetes. Denies fm hx of neuropathy.  He remains on folic acid and B vitamins.   He also reports acute onset of low back pain, denies radiculopathy. Worse after sitting for prolonged period of time, can improve after moving. No specific injury, reports he has experienced similar symptoms previously and eventually resolved on their own. He also notes right lower abdominal skin sensitivity and burning symptoms over the past week, no skin changes or rash.    No new stroke symptoms. Remains on Eliquis without side effects, has f/u visit with oncology in February Repeat MRV and MRI brain 03/2023 showed some possible improvement compared to prior imaging Continued tobacco use, approx 2 pack/day      History provided for reference purposes only Update 03/17/2023 JM: Patient is being seen for initial hospital follow-up unaccompanied. Does report right>left head numbness/tingling present since hospitalization, can occur intermittently. Also reports occasional temporal and occipital headaches, about 3x per week, usually resolve with Tylenol, reports longstanding history of headaches and migraines since childhood, denies worsening since recent hospitalization.  Has not had any recent migraine type headaches.  Not currently on preventative medications. At times can have difficulty seeing  out of left eye, possibly blurred vision but difficult to explain, will resolve after 15 to 20 seconds, only occurred once last week. Also mentions bilateral bottom foot numbness with burning sensation at night, present prior to hospitalization.  Reports currently living alone, typically snacks on junk food during the day, will cook meals on occasion.  Denies any further weight loss since discharge, currently being followed by GI.  Denies any EtOH use over the past month.  Tobacco cessation for initial 2 weeks after hospitalization but unfortunately restarted and currently smoking 2 packs/day, prior 2-3 packs/day.  Continues on Eliquis without side effects. 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  Continued use of folic acid and B vitamins, denies recheck of these labs since discharge.    Stroke admission 01/12/2023 Mr. NASHEED Rose is a 62 y.o. male with history of COPD, hypertension, tobacco abuse and GERD who originally presented with altered mental status and hyponatremia after being found down in his home on 01/12/2023.  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  CT chest showed incidental finding of acute pulmonary embolism and RLE age-indeterminate DVT. Initially placed on heparin drip and transitioned to Eliquis.  Hypercoagulable labs pending at discharge, referral placed to hematology for outpatient follow-up.  Homocystine 40.6, B12 107, started on folic acid and B12 supplement.  Work up for weight loss and malnutrition, recommended follow-up with GI and  hematology/oncology.  Also evaluated by dietitian for supplement recommendations. Current tobacco use with cessation  counseling provided.    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  Bilateral ICA bulb and siphon atherosclerosis. 2D Echo EF 60 to 65%, normal left atrial size, no atrial level shunt LDL 50 HgbA1c 5.3    ROS:   14 system review of systems performed and negative with exception of those listed in HPI  PMH:  Past Medical History:  Diagnosis Date   Chronic constipation    COPD (chronic obstructive pulmonary disease) (HCC)    Depression    GERD (gastroesophageal reflux disease)    Headache    Hypertension     PSH:  Past Surgical History:  Procedure Laterality Date   APPENDECTOMY     COLONOSCOPY  08   NUR   head trauma     from trauma   UPPER GASTROINTESTINAL ENDOSCOPY      Social History:  Social History   Socioeconomic History   Marital status: Legally Separated    Spouse name: Not on file   Number of children: Not on file   Years of education: Not on file   Highest education level: Not on file  Occupational History   Not on file  Tobacco Use   Smoking status: Every Day    Current packs/day: 3.00    Average packs/day: 3.0 packs/day for 53.0 years (158.9 ttl pk-yrs)    Types: Cigarettes    Start date: 20   Smokeless tobacco: Former    Types: Chew    Quit date: 04/29/1987   Tobacco comments:    1 1/2 pack a day 35 yrs patient is aware he needs to quit   Vaping Use   Vaping status: Never Used  Substance and Sexual Activity   Alcohol use: Yes    Alcohol/week: 0.0 standard drinks of alcohol    Comment: occasionally   Drug use: No   Sexual  activity: Never    Partners: Female  Other Topics Concern   Not on file  Social History Narrative   Not on file   Social Drivers of Health   Financial Resource Strain: Not on file  Food Insecurity: No Food Insecurity (02/03/2023)   Hunger Vital Sign    Worried About Running Out of Food in the Last Year: Never true    Ran Out of Food in the Last Year: Never true  Transportation Needs: No Transportation Needs (02/03/2023)   PRAPARE - Administrator, Civil Service (Medical): No    Lack of Transportation (Non-Medical): No  Physical Activity: Not on file  Stress: Not on file  Social Connections: Not on file  Intimate Partner Violence: Not At Risk (02/03/2023)   Humiliation, Afraid, Rape, and Kick questionnaire    Fear of Current or Ex-Partner: No    Emotionally Abused: No    Physically Abused: No    Sexually Abused: No    Family History:  Family History  Problem Relation Age of Onset   Lung cancer Mother    Emphysema Father    Kidney cancer Sister    Heart disease Brother    Aneurysm Sister  Breast cancer Sister    Colon cancer Maternal Aunt    Colon cancer Maternal Uncle        x4    Medications:   Current Outpatient Medications on File Prior to Visit  Medication Sig Dispense Refill   acetaminophen (TYLENOL) 500 MG tablet Take 500-1,000 mg by mouth daily as needed for mild pain or headache.     albuterol (VENTOLIN HFA) 108 (90 Base) MCG/ACT inhaler Inhale 2 puffs into the lungs every 4 (four) hours as needed for shortness of breath. 6.7 g 0   amLODipine (NORVASC) 10 MG tablet Take 10 mg by mouth daily.  0   apixaban (ELIQUIS) 5 MG TABS tablet Take 1 tablet (5 mg total) by mouth 2 (two) times daily. 60 tablet 5   cyanocobalamin 1000 MCG tablet Take 1 tablet (1,000 mcg total) by mouth daily. 30 tablet 0   famotidine (PEPCID) 20 MG tablet Take 1 tablet (20 mg total) by mouth daily. 30 tablet 0   folic acid (FOLVITE) 1 MG tablet Take 1 tablet (1 mg total) by mouth  daily. 30 tablet 0   labetalol (NORMODYNE) 300 MG tablet Take 300 mg by mouth 2 (two) times daily.     losartan (COZAAR) 50 MG tablet Take 1 tablet (50 mg total) by mouth daily. 30 tablet 0   OVER THE COUNTER MEDICATION CeraVite/ Antioxidant daily     pantoprazole (PROTONIX) 40 MG tablet Take 1 tablet (40 mg total) by mouth 2 (two) times daily. 60 tablet 0   pyridOXINE (B-6) 50 MG tablet Take 1 tablet (50 mg total) by mouth daily for 30 days then as directed by MD 100 tablet 0   thiamine (VITAMIN B1) 100 MG tablet Take 1 tablet (100 mg total) by mouth daily. 30 tablet 0   umeclidinium-vilanterol (ANORO ELLIPTA) 62.5-25 MCG/ACT AEPB Inhale 1 puff into the lungs daily. 60 each 0   No current facility-administered medications on file prior to visit.    Allergies:   Allergies  Allergen Reactions   Effexor [Venlafaxine Hydrochloride] Other (See Comments)    unknown      OBJECTIVE:  Physical Exam  Vitals:   07/22/23 1107  BP: 129/78  Pulse: 64  Weight: 182 lb 12.8 oz (82.9 kg)  Height: 5\' 9"  (1.753 m)    Body mass index is 26.99 kg/m. No results found.  General: well developed, well nourished, very pleasant middle-age Caucasian male, seated, in no evident distress  Neurologic Exam Mental Status: Awake and fully alert.  Fluent speech and language.  Oriented to place and time. Recent and remote memory intact. Attention span, concentration and fund of knowledge appropriate. Mood and affect appropriate.  Cranial Nerves: Pupils equal, briskly reactive to light. Extraocular movements full without nystagmus. Visual fields full to confrontation. Hearing intact. Facial sensation intact. Face, tongue, palate moves normally and symmetrically.  Motor: Normal bulk and tone. Normal strength in all tested extremity muscles Sensory.:  Decreased sensation bilateral lower extremity from ankle down Coordination: Rapid alternating movements normal in all extremities. Finger-to-nose and heel-to-shin  performed accurately bilaterally. Gait and Station: Arises from chair without difficulty. Stance is normal. Gait demonstrates normal stride length and balance without use of AD.  Reflexes: 1+ and symmetric. Toes downgoing.         ASSESSMENT: Isaac Rose is a 62 y.o. 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  Hospital course complicated by acute PE and age-indeterminate RLE DVT , hyperhomocysteinemia and B12 deficiency , severe hyponatremia and malnutrition.  Vascular risk factors include HTN, tobacco and EtOH use. Complains of chronic neuropathic pain in bilateral lower extremities and acute right low back pain and right abdominal pain.      PLAN:  CVST :  Hypercoagulable labs negative Continue Eliquis 5mg  twice daily managed by hematology who recommends continuing for at least 1 year (found to have PE and age-indeterminate DVT 01/2023) MRV and MRI brain 03/2023 partially recanalized but persistent thrombosis with slight improvement compared to prior imaging (imaging completed earlier than requested/recommended) MRI brain 01/2023 extensive thrombosis of superior sagittal sinus, right transverse sinus, extending into upper right internal jugular vein.  Lesser nonocclusive thrombus within the left transverse and sigmoid venous sinuses.  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 Does have B12 deficiency but has since resolved on supplement A1c 5.3 01/2023 discussed pursing EMG/NCV for further evaluation but declines interest at this time, he would be interested in testing if symptoms should progress Recommend trying gabapentin 300 mg twice daily for symptomatic management  Acute low back pain Suspect musculoskeletal strain, no radiculopathy Start steroid taper pack, advised gabapentin may also provide benefit Follow up with PCP if symptoms persist  Right side abdominal pain: Appears to be more skin sensitivity concern - currently no evidence of rash or lesions but advised to monitor. Advised to f/u with PCP if symptoms persist.   Tobacco use:  Discussed importance of complete tobacco cessation as continued use greatly increases risk of cardiovascular disease and other health related issues.  Advised him to follow-up with PCP if assistance is needed      Follow up in 6 months or call earlier if needed    CC:  [facility] provider: Dr. Pearlean Brownie PCP: Elfredia Nevins, MD    I spent a prolonged 45 minutes of face-to-face and non-face-to-face time with patient.  This included previsit chart review, lab review, study review, order entry, electronic health record documentation, patient education and discussion regarding above diagnoses and treatment plan and answered all other questions to patient satisfaction  Ihor Austin, [facility]  [facility] [address] [place], [place] [address]  Phone [phone] Fax [phone] Note: This document was prepared with digital dictation and possible smart phrase technology. Any transcriptional errors that result from this process are unintentional.

## 2023-07-22 NOTE — Patient Instructions (Addendum)
Your Plan:  Recommend trying gabapentin 300mg  twice daily as needed for neuropathy - can start with just evening dose for now and can add morning dose if needed  Will send in steroid taper pack for low back pain - take as directed  If symptoms worsen, would recommend proceeding with EMG/NCV for further evaluation  Continue Eliquis - follow up with oncology in February as scheduled  Would highly recommend complete tobacco cessation - please follow up with your PCP if you need assistance      Follow up in 6 months or call earlier if needed     Thank you for coming to see [place] at [facility]. I hope we have been able to provide you high quality care today.  You may receive a patient satisfaction survey over the next few weeks. We would appreciate your feedback and comments so that we may continue to improve ourselves and the health of our patients.

## 2023-09-05 ENCOUNTER — Inpatient Hospital Stay: Payer: 59 | Attending: Hematology

## 2023-09-05 DIAGNOSIS — I2694 Multiple subsegmental pulmonary emboli without acute cor pulmonale: Secondary | ICD-10-CM

## 2023-09-05 DIAGNOSIS — Z86711 Personal history of pulmonary embolism: Secondary | ICD-10-CM | POA: Diagnosis present

## 2023-09-05 DIAGNOSIS — Z7901 Long term (current) use of anticoagulants: Secondary | ICD-10-CM | POA: Diagnosis not present

## 2023-09-05 DIAGNOSIS — Z86718 Personal history of other venous thrombosis and embolism: Secondary | ICD-10-CM | POA: Insufficient documentation

## 2023-09-05 LAB — D-DIMER, QUANTITATIVE: D-Dimer, Quant: 0.88 ug{FEU}/mL — ABNORMAL HIGH (ref 0.00–0.50)

## 2023-09-12 ENCOUNTER — Inpatient Hospital Stay: Payer: 59 | Attending: Oncology | Admitting: Oncology

## 2023-09-12 VITALS — BP 144/96 | HR 64 | Temp 96.4°F | Resp 16 | Wt 193.8 lb

## 2023-09-12 DIAGNOSIS — Z7901 Long term (current) use of anticoagulants: Secondary | ICD-10-CM | POA: Diagnosis not present

## 2023-09-12 DIAGNOSIS — Z803 Family history of malignant neoplasm of breast: Secondary | ICD-10-CM | POA: Insufficient documentation

## 2023-09-12 DIAGNOSIS — I2694 Multiple subsegmental pulmonary emboli without acute cor pulmonale: Secondary | ICD-10-CM

## 2023-09-12 DIAGNOSIS — Z86718 Personal history of other venous thrombosis and embolism: Secondary | ICD-10-CM | POA: Diagnosis not present

## 2023-09-12 DIAGNOSIS — Z801 Family history of malignant neoplasm of trachea, bronchus and lung: Secondary | ICD-10-CM | POA: Diagnosis not present

## 2023-09-12 DIAGNOSIS — Z87891 Personal history of nicotine dependence: Secondary | ICD-10-CM | POA: Insufficient documentation

## 2023-09-12 DIAGNOSIS — Z86711 Personal history of pulmonary embolism: Secondary | ICD-10-CM | POA: Insufficient documentation

## 2023-09-12 DIAGNOSIS — Z8051 Family history of malignant neoplasm of kidney: Secondary | ICD-10-CM | POA: Diagnosis not present

## 2023-09-12 DIAGNOSIS — Z800 Family history of malignant neoplasm of digestive organs: Secondary | ICD-10-CM | POA: Diagnosis not present

## 2023-09-12 NOTE — Progress Notes (Signed)
 [facility] [address], [place] [address]   Clinic Day:  09/15/2023  Referring physician: Bertell Satterfield, MD  Patient Care Team: Bertell Satterfield, MD as PCP - General (Internal Medicine)   ASSESSMENT & PLAN:   Assessment:  1.  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  He was also found to be hyponatremic.  He was reportedly sick with vomiting and decreased appetite and decreased activity for a few days prior to presentation to the ER. - CT head without contrast (01/07/2023): Relatively dense appearance of the dural venous sinuses, particularly right transverse/sigmoid sinus. - MRI brain (01/08/2023): No infarction.  Chronic small vessel ischemic changes.  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  Lesser nonocclusive thrombus within the left transverse and sigmoid venous sinuses.  Some superficial venous thrombosis over the parietal convexities as well. - CT CAP (01/10/2023): Acute pulmonary embolism with moderate embolic burden with no evidence of right heart strain.  No evidence of malignancy. - No prior history of thrombosis.  Last colonoscopy in 2008.  He lost about 10-15 pounds in the last 1 year.  2.  Social/family history: -Lives at home with his wife.  He retired after working in the [employer] at [employer] for 28 years.  Quit smoking on 01/07/2023.  Smoked 2-1/2 pack/day, started at age 7.  He had routine use of Roundup exposure for 20 years.  Also had exposure to diquat for couple of years. - Older sister had kidney cancer.  Younger sister had breast cancer.  4-5 maternal uncles had colon cancer.  Few maternal cousins also had colon cancer.  Another maternal cousin had cancer in the back.  Plan:  1.  Cerebral venous sinus thrombosis and  pulmonary embolism: - He continues to have bitemporal headaches on and off-some are more severe than others. - Thrombotic episodes most likely unprovoked/weakly provoked. - Hypercoagulable testing from 02/03/23 was negative for lupus anticoagulant, PNH panel, protein C, protein S, Antithrombin III , JAK2 V617F and reflex testing. - Due to unprovoked nature of the cerebral venous sinus thrombosis, recommend anticoagulation at least for a year and reevaluate.  Will consider repeating brain MRI after 1 year of Eliquis . -D-dimer from 09/05/2023 was 0.88. -No signs or symptoms of recurrent blood clots at this time. - Recommend follow-up in 6 months with D-dimer and Brain MRI if he remains symptomatic.    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 male presenting to clinic today for follow-up.  He was last seen by Dr. Rogers on 03/05/2023.  He initially presented to the ED on 01/07/23 with confusion after being found on the floor by his wife. He was subsequently admitted for hyponatremia and confusion. Brain MRI the following day reveled venous sinus thrombosis-- extensive  thrombosis of superior sagittal sinus and right transverse sinus extending into right internal jugular vein, as well as nonocclusive thrombus in left transverse and sigmoid sinuses. Thus, he was started on heparin . A CT C/A/P showed an incidental PE but no evidence of malignancy. He was discharged on apixaban .  INTERVAL HISTORY:   Isaac Rose is a 63  y.o. 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 He was last seen by Dr. Rogers on 03/05/2023.  Since his last visit, he denies any hospitalizations, surgeries or changes to his baseline health.  Today, he states that he is doing well overall. His appetite level is at 70%. His energy level is at 40%.  He has now been on Eliquis  for approximately 6 months.  Denies any bleeding, hematochezia or melena.  Reports he takes his Eliquis  twice daily as prescribed.  Has not missed any doses.  States he is not sleeping well.  Reports an appetite of 75% and energy levels of 50%.  He is hoping he could come off Eliquis  soon because he is about to retire and unsure of how much his medication would cost.  Currently he is getting it for free.  Reports several episodes of severe unilateral temporal headaches. One HA woke him up from his sleep 2 weeks ago.  None since. He is unsure if he is dreaming or if they are actually occurring.  No changes in vision.  PAST MEDICAL HISTORY:   Past Medical History: Past Medical History:  Diagnosis Date   Chronic constipation    COPD (chronic obstructive pulmonary disease) (HCC)    Depression    GERD (gastroesophageal reflux disease)    Headache    Hypertension     Surgical History: Past Surgical History:  Procedure Laterality Date   APPENDECTOMY     COLONOSCOPY  08   NUR   head trauma     from trauma   UPPER GASTROINTESTINAL ENDOSCOPY      Social History: Social History   Socioeconomic History   Marital status: Legally Separated    Spouse name: Not on file   Number of children: Not on file   Years of education: Not on file   Highest education level: Not on file  Occupational History   Not on file  Tobacco Use   Smoking status: Every Day    Current packs/day: 3.00    Average packs/day: 3.0 packs/day for 53.1 years (159.3 ttl pk-yrs)    Types: Cigarettes    Start date: 62    Smokeless tobacco: Former    Types: Chew    Quit date: 04/29/1987   Tobacco comments:    1 1/2 pack a day 34 yrs patient is aware he needs to quit   Vaping Use   Vaping status: Never Used  Substance and Sexual Activity   Alcohol use: Yes    Alcohol/week: 0.0 standard drinks of alcohol    Comment: occasionally   Drug use: No   Sexual activity: Never    Partners: Female  Other Topics Concern   Not on file  Social History Narrative   Not on file   Social Drivers of Health   Financial Resource Strain: Not on file  Food Insecurity: No Food Insecurity (02/03/2023)   Hunger Vital Sign    Worried About Running Out of Food in the Last Year: Never true    Ran Out of Food in the Last Year: Never true  Transportation Needs: No  Transportation Needs (02/03/2023)   PRAPARE - Administrator, Civil Service (Medical): No    Lack of Transportation (Non-Medical): No  Physical Activity: Not on file  Stress: Not on file  Social Connections: Not on file  Intimate Partner Violence: Not At Risk (02/03/2023)   Humiliation, Afraid, Rape, and Kick questionnaire    Fear of Current or Ex-Partner: No    Emotionally Abused: No    Physically Abused: No    Sexually Abused: No    Family History: Family History  Problem Relation Age of Onset   Lung cancer Mother    Emphysema Father    Kidney cancer Sister    Heart disease Brother    Aneurysm Sister    Breast cancer Sister    Colon cancer Maternal Aunt    Colon cancer Maternal Uncle        x4    Current Medications:  Current Outpatient Medications:    acetaminophen (TYLENOL) 500 MG tablet, Take 500-1,000 mg by mouth daily as needed for mild pain or headache., Disp: , Rfl:    albuterol  (VENTOLIN  HFA) 108 (90 Base) MCG/ACT inhaler, Inhale 2 puffs into the lungs every 4 (four) hours as needed for shortness of breath., Disp: 6.7 g, Rfl: 0   amLODipine  (NORVASC ) 10 MG tablet, Take 10 mg by mouth daily., Disp: , Rfl: 0   apixaban  (ELIQUIS ) 5 MG  TABS tablet, Take 1 tablet (5 mg total) by mouth 2 (two) times daily., Disp: 60 tablet, Rfl: 5   cyanocobalamin  1000 MCG tablet, Take 1 tablet (1,000 mcg total) by mouth daily., Disp: 30 tablet, Rfl: 0   famotidine  (PEPCID ) 20 MG tablet, Take 1 tablet (20 mg total) by mouth daily., Disp: 30 tablet, Rfl: 0   folic acid  (FOLVITE ) 1 MG tablet, Take 1 tablet (1 mg total) by mouth daily., Disp: 30 tablet, Rfl: 0   gabapentin  (NEURONTIN ) 300 MG capsule, Take 1 capsule (300 mg total) by mouth 2 (two) times daily., Disp: 60 capsule, Rfl: 11   labetalol  (NORMODYNE ) 300 MG tablet, Take 300 mg by mouth 2 (two) times daily., Disp: , Rfl:    losartan  (COZAAR ) 50 MG tablet, Take 1 tablet (50 mg total) by mouth daily., Disp: 30 tablet, Rfl: 0   methylPREDNISolone  (MEDROL  DOSEPAK) 4 MG TBPK tablet, Taper pack as directed, Disp: 1 each, Rfl: 0   Multiple Vitamins-Minerals ([employer] SPECTRAVITE ADULTS) TABS, Take 1 tablet by mouth daily., Disp: , Rfl:    OVER THE COUNTER MEDICATION, CeraVite/ Antioxidant daily, Disp: , Rfl:    pantoprazole  (PROTONIX ) 40 MG tablet, Take 1 tablet (40 mg total) by mouth 2 (two) times daily., Disp: 60 tablet, Rfl: 0   pyridOXINE  (B-6) 50 MG tablet, Take 1 tablet (50 mg total) by mouth daily for 30 days then as directed by MD, Disp: 100 tablet, Rfl: 0   SENEXON-S 8.6-50 MG tablet, Take 1 tablet by mouth daily., Disp: , Rfl:    thiamine  (VITAMIN B1) 100 MG tablet, Take 1 tablet (100 mg total) by mouth daily., Disp: 30 tablet, Rfl: 0   umeclidinium-vilanterol (ANORO ELLIPTA ) 62.5-25 MCG/ACT AEPB, Inhale 1 puff into the lungs daily., Disp: 60 each, Rfl: 0   Allergies: Allergies  Allergen Reactions   Effexor [Venlafaxine Hydrochloride] Other (See Comments)    unknown    REVIEW OF SYSTEMS:   Review of Systems  Respiratory:  Positive for shortness of breath.   Cardiovascular:  Positive for chest pain.  Neurological:  Positive for dizziness,  headaches and numbness.   Psychiatric/Behavioral:  Positive for sleep disturbance.      VITALS:   Blood pressure (!) 144/96, pulse 64, temperature (!) 96.4 F (35.8 C), temperature source Oral, resp. rate 16, weight 193 lb 12.6 oz (87.9 kg), SpO2 100%.  Wt Readings from Last 3 Encounters:  09/12/23 193 lb 12.6 oz (87.9 kg)  07/22/23 182 lb 12.8 oz (82.9 kg)  03/17/23 166 lb (75.3 kg)    Body mass index is 28.62 kg/m.   PHYSICAL EXAM:   Physical Exam Constitutional:      Appearance: Normal appearance.  Cardiovascular:     Rate and Rhythm: Normal rate and regular rhythm.  Pulmonary:     Effort: Pulmonary effort is normal.     Breath sounds: Normal breath sounds.  Abdominal:     General: Bowel sounds are normal.     Palpations: Abdomen is soft.  Musculoskeletal:        General: No swelling. Normal range of motion.  Neurological:     Mental Status: He is alert and oriented to person, place, and time. Mental status is at baseline.     LABS:      Latest Ref Rng & Units 01/16/2023   11:51 AM 01/15/2023    3:15 AM 01/14/2023    4:45 AM  CBC  WBC 4.0 - 10.5 K/uL 9.2  8.4  8.3   Hemoglobin 13.0 - 17.0 g/dL 87.0  87.1  86.8   Hematocrit 39.0 - 52.0 % 37.0  36.4  37.7   Platelets 150 - 400 K/uL 433  381  367       Latest Ref Rng & Units 03/17/2023   11:20 AM 01/16/2023   11:51 AM 01/15/2023   10:24 AM  CMP  Glucose 70 - 99 mg/dL 93  87  80   BUN 8 - 27 mg/dL 9  6  10    Creatinine 0.76 - 1.27 mg/dL 8.90  9.33  9.40   Sodium 134 - 144 mmol/L 143  131  127   Potassium 3.5 - 5.2 mmol/L 4.8  4.2  4.2   Chloride 96 - 106 mmol/L 103  99  94   CO2 20 - 29 mmol/L 23  24  24    Calcium  8.6 - 10.2 mg/dL 9.9  8.4  8.5   Total Protein 6.5 - 8.1 g/dL  6.4  6.5   Total Bilirubin 0.3 - 1.2 mg/dL  0.9  1.0   Alkaline Phos 38 - 126 U/L  79  78   AST 15 - 41 U/L  22  24   ALT 0 - 44 U/L  15  15      No results found for: CEA1, CEA / No results found for: CEA1, CEA No results found for: PSA1 No  results found for: CAN199 No results found for: CAN125  No results found for: TOTALPROTELP, ALBUMINELP, A1GS, A2GS, BETS, BETA2SER, GAMS, MSPIKE, SPEI No results found for: TIBC, FERRITIN, IRONPCTSAT No results found for: LDH   STUDIES:   No results found.

## 2023-11-14 ENCOUNTER — Other Ambulatory Visit: Payer: Self-pay

## 2023-11-14 DIAGNOSIS — Z122 Encounter for screening for malignant neoplasm of respiratory organs: Secondary | ICD-10-CM

## 2023-11-14 DIAGNOSIS — F17210 Nicotine dependence, cigarettes, uncomplicated: Secondary | ICD-10-CM

## 2023-11-14 DIAGNOSIS — Z87891 Personal history of nicotine dependence: Secondary | ICD-10-CM

## 2024-01-12 ENCOUNTER — Ambulatory Visit
Admission: RE | Admit: 2024-01-12 | Discharge: 2024-01-12 | Disposition: A | Discharge status: Home / Self Care | Source: Ambulatory Visit | Attending: Acute Care | Admitting: Acute Care

## 2024-01-12 DIAGNOSIS — F17210 Nicotine dependence, cigarettes, uncomplicated: Secondary | ICD-10-CM

## 2024-01-12 DIAGNOSIS — Z87891 Personal history of nicotine dependence: Secondary | ICD-10-CM

## 2024-01-12 DIAGNOSIS — Z122 Encounter for screening for malignant neoplasm of respiratory organs: Secondary | ICD-10-CM

## 2024-01-26 ENCOUNTER — Telehealth: Payer: Self-pay

## 2024-01-26 NOTE — Telephone Encounter (Signed)
 Call Report from Tiffany  IMPRESSION: 1. Lung-RADS 4B, suspicious. Additional imaging evaluation or consultation with Pulmonology or Thoracic Surgery recommended. Subpleural nodular opacity in the right lower lobe measuring 13.7 mm mean diameter is new from 2020 exam. This is at site of previous airspace disease on intervening chest CT, and indeterminate for scarring versus nodule. Could be considered to assess for metabolic activity. 2. New subpleural nodular density in the right lower lobe with mean diameter of 5.2 mm, also indeterminate for nodule versus scarring. In isolation, this is a Lung-RADS 3 nodule. 3. Additional pulmonary nodules are stable. 4. Coronary artery calcifications. 5. Aortic Atherosclerosis (ICD10-I70.0) and Emphysema (ICD10-J43.9).

## 2024-01-28 ENCOUNTER — Other Ambulatory Visit: Payer: Self-pay

## 2024-01-28 DIAGNOSIS — Z87891 Personal history of nicotine dependence: Secondary | ICD-10-CM

## 2024-01-28 DIAGNOSIS — R911 Solitary pulmonary nodule: Secondary | ICD-10-CM

## 2024-01-28 DIAGNOSIS — Z122 Encounter for screening for malignant neoplasm of respiratory organs: Secondary | ICD-10-CM

## 2024-01-28 NOTE — Telephone Encounter (Signed)
 Spoke with patient and reviewed recent Lung CT results. Pt is in agreement to complete a PET scan to evaluate two new lung nodules noted in the right lower lobe with the largest nodule measuring 13.70mm. PET order has been placed. Results and plan to PCP. Pt has no additional questions.

## 2024-01-28 NOTE — Telephone Encounter (Signed)
 Results of LDCT reviewed by Lauraine Lites, NP.  Please notify patient of results and need for PET scan. Two new lung nodules noted in the right lower lobe with the largest nodule measuring 13.85mm.  Due to the size, PET is recommended and an OV follow up with Lauraine to discuss results.  Previously seen lung nodules are stable.  Atherosclerosis and emphysema noted as additional findings.  Please notify patient and fax results and plan to PCP and order PET with OV follow up.

## 2024-02-11 NOTE — Progress Notes (Unsigned)
 [facility] [address] [place]. [place] [address] [phone]       OFFICE FOLLOW UP NOTE  [name] Date of Birth:  Jul 24, 1961 Medical Record Number:  [id number]    Primary neurologist: Dr. Rosemarie Reason for visit: CVST follow up    SUBJECTIVE:   HPI:   Update 02/11/2024 JM: patient returns for follow up visit unaccompanied.  Overall stable from stroke standpoint without new stroke/TIA symptoms.  Remains on Eliquis  without side effects.  Has follow-up with oncology next month to discuss ongoing need of Eliquis  and plans on repeat imaging.        History provided for reference purposes only Update 07/22/2023 JM: Patient returns for follow-up visit unaccompanied.   He reports continued bilateral foot neuropathy, worse at night, can interfere with his sleep. Denies any worsening. 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 No hx of diabetes. Denies fm hx of neuropathy.  He remains on folic acid  and B vitamins.   He also reports acute onset of low back pain, denies radiculopathy. Worse after sitting for prolonged period of time, can improve after moving. No specific injury, reports he has experienced similar symptoms previously and eventually resolved on their own. He also notes right lower abdominal skin sensitivity and burning symptoms over the past week, no skin changes or rash.    No new stroke symptoms. Remains on Eliquis  without side effects, has f/u visit with oncology in February Repeat MRV and MRI brain 03/2023 showed some possible improvement compared to prior imaging Continued tobacco use, approx 2 pack/day  Update 03/17/2023 JM: Patient is being seen for initial hospital follow-up unaccompanied. Does report right>left head numbness/tingling present since hospitalization, can occur intermittently. Also reports occasional temporal and occipital headaches, about  3x per week, usually resolve with Tylenol, reports longstanding history of headaches and migraines since childhood, denies worsening since recent hospitalization.  Has not had any recent migraine type headaches.  Not currently on preventative medications. At times can have difficulty seeing out of left eye, possibly blurred vision but difficult to explain, will resolve after 15 to 20 seconds, only occurred once last week. Also mentions bilateral bottom foot numbness with burning sensation at night, present prior to hospitalization.  Reports currently living alone, typically snacks on junk food during the day, will cook meals on occasion.  Denies any further weight loss since discharge, currently being followed by GI.  Denies any EtOH use over the past month.  Tobacco cessation for initial 2 weeks after hospitalization but unfortunately restarted and currently smoking 2 packs/day, prior 2-3 packs/day.  Continues on Eliquis  without side effects. 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  Continued use of folic acid  and B vitamins, denies recheck of these labs since discharge.    Stroke admission 01/12/2023 [name] is a 63 y.o. male with history of COPD, hypertension, tobacco abuse and GERD who originally presented with altered mental status and hyponatremia after being found down in his home on 01/12/2023.  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  CT chest showed incidental finding of acute pulmonary embolism and RLE age-indeterminate  DVT. Initially placed on heparin  drip and transitioned to Eliquis .  Hypercoagulable labs pending at discharge, referral placed to  hematology for outpatient follow-up.  Homocystine 40.6, B12 107, started on folic acid  and B12 supplement.  Work up for weight loss and malnutrition, recommended follow-up with GI and hematology/oncology.  Also evaluated by dietitian for supplement recommendations. Current tobacco use with cessation counseling provided.    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  Bilateral ICA bulb and siphon atherosclerosis. 2D Echo EF 60 to 65%, normal left atrial size, no atrial level shunt LDL 50 HgbA1c 5.3    ROS:   14 system review of systems performed and negative with exception of those listed in HPI  PMH:  Past Medical History:  Diagnosis Date   Chronic constipation    COPD (chronic obstructive pulmonary disease) (HCC)    Depression    GERD (gastroesophageal reflux disease)    Headache    Hypertension     PSH:  Past Surgical History:  Procedure Laterality Date   APPENDECTOMY     COLONOSCOPY  08   NUR   head trauma     from trauma   UPPER GASTROINTESTINAL ENDOSCOPY      Social History:  Social History   Socioeconomic History   Marital status: Legally Separated    Spouse name: Not on file   Number of children: Not on file   Years of education: Not on file   Highest education level: Not on file  Occupational History   Not on file  Tobacco Use   Smoking status: Every Day    Current packs/day: 3.00    Average packs/day: 3.0 packs/day for 53.5 years (160.6 ttl pk-yrs)    Types: Cigarettes    Start date: 71   Smokeless tobacco: Former    Types: Chew    Quit  date: 04/29/1987   Tobacco comments:    1 1/2 pack a day 34 yrs patient is aware he needs to quit   Vaping Use   Vaping status: Never Used  Substance and Sexual Activity   Alcohol use: Yes    Alcohol/week: 0.0 standard drinks of alcohol    Comment: occasionally   Drug use: No   Sexual activity: Never    Partners: Female  Other Topics Concern   Not on file  Social History Narrative   Not on file   Social Drivers of Health   Financial Resource Strain: Not on file  Food Insecurity: No Food Insecurity (02/03/2023)   Hunger Vital Sign    Worried About Running Out of Food in the Last Year: Never true    Ran Out of Food in the Last Year: Never true  Transportation Needs: No Transportation Needs (02/03/2023)   PRAPARE - Administrator, Civil Service (Medical): No    Lack of Transportation (Non-Medical): No  Physical Activity: Not on file  Stress: Not on file  Social Connections: Not on file  Intimate Partner Violence: Not At Risk (02/03/2023)   Humiliation, Afraid, Rape, and Kick questionnaire    Fear of Current or Ex-Partner: No    Emotionally Abused: No    Physically Abused: No  Sexually Abused: No    Family History:  Family History  Problem Relation Age of Onset   Lung cancer Mother    Emphysema Father    Kidney cancer Sister    Heart disease Brother    Aneurysm Sister    Breast cancer Sister    Colon cancer Maternal Aunt    Colon cancer Maternal Uncle        x4    Medications:   Current Outpatient Medications on File Prior to Visit  Medication Sig Dispense Refill   acetaminophen (TYLENOL) 500 MG tablet Take 500-1,000 mg by mouth daily as needed for mild pain or headache.     albuterol  (VENTOLIN  HFA) 108 (90 Base) MCG/ACT inhaler Inhale 2 puffs into the lungs every 4 (four) hours as needed for shortness of breath. 6.7 g 0   amLODipine  (NORVASC ) 10 MG tablet Take 10 mg by mouth daily.  0   apixaban  (ELIQUIS ) 5 MG TABS tablet Take 1 tablet (5 mg total) by  mouth 2 (two) times daily. 60 tablet 5   cyanocobalamin  1000 MCG tablet Take 1 tablet (1,000 mcg total) by mouth daily. 30 tablet 0   famotidine  (PEPCID ) 20 MG tablet Take 1 tablet (20 mg total) by mouth daily. 30 tablet 0   folic acid  (FOLVITE ) 1 MG tablet Take 1 tablet (1 mg total) by mouth daily. 30 tablet 0   gabapentin  (NEURONTIN ) 300 MG capsule Take 1 capsule (300 mg total) by mouth 2 (two) times daily. 60 capsule 11   labetalol  (NORMODYNE ) 300 MG tablet Take 300 mg by mouth 2 (two) times daily.     losartan  (COZAAR ) 50 MG tablet Take 1 tablet (50 mg total) by mouth daily. 30 tablet 0   methylPREDNISolone  (MEDROL  DOSEPAK) 4 MG TBPK tablet Taper pack as directed 1 each 0   Multiple Vitamins-Minerals ([employer] SPECTRAVITE ADULTS) TABS Take 1 tablet by mouth daily.     OVER THE COUNTER MEDICATION CeraVite/ Antioxidant daily     pantoprazole  (PROTONIX ) 40 MG tablet Take 1 tablet (40 mg total) by mouth 2 (two) times daily. 60 tablet 0   pyridOXINE  (B-6) 50 MG tablet Take 1 tablet (50 mg total) by mouth daily for 30 days then as directed by MD 100 tablet 0   SENEXON-S 8.6-50 MG tablet Take 1 tablet by mouth daily.     thiamine  (VITAMIN B1) 100 MG tablet Take 1 tablet (100 mg total) by mouth daily. 30 tablet 0   umeclidinium-vilanterol (ANORO ELLIPTA ) 62.5-25 MCG/ACT AEPB Inhale 1 puff into the lungs daily. 60 each 0   No current facility-administered medications on file prior to visit.    Allergies:   Allergies  Allergen Reactions   Effexor [Venlafaxine Hydrochloride] Other (See Comments)    unknown      OBJECTIVE:  Physical Exam  There were no vitals filed for this visit.   There is no height or weight on file to calculate BMI. No results found.  General: well developed, well nourished, very pleasant middle-age Caucasian male, seated, in no evident distress  Neurologic Exam Mental Status: Awake and fully alert.  Fluent speech and language.  Oriented to place and time. Recent and  remote memory intact. Attention span, concentration and fund of knowledge appropriate. Mood and affect appropriate.  Cranial Nerves: Pupils equal, briskly reactive to light. Extraocular movements full without nystagmus. Visual fields full to confrontation. Hearing intact. Facial sensation intact. Face, tongue, palate moves normally and symmetrically.  Motor: Normal bulk and tone. Normal  strength in all tested extremity muscles Sensory.:  Decreased sensation bilateral lower extremity from ankle down Coordination: Rapid alternating movements normal in all extremities. Finger-to-nose and heel-to-shin performed accurately bilaterally. Gait and Station: Arises from chair without difficulty. Stance is normal. Gait demonstrates normal stride length and balance without use of AD.  Reflexes: 1+ and symmetric. Toes downgoing.         ASSESSMENT: Isaac Rose is a 63 y.o. year old male who presented to ED on 01/07/2023 with altered mental status and hyponatremia after being found down, MRI negative for acute stroke but did show extensive thrombosis superior sagittal venous sinus and right transverse sinus extending into upper right internal jugular vein with additional thrombus in the left transverse and sigmoid venous sinus secondary to unclear etiology.  Hospital course complicated by acute PE and age-indeterminate RLE DVT , hyperhomocysteinemia and B12 deficiency , severe hyponatremia and malnutrition.  Vascular risk factors include HTN, tobacco and EtOH use. Complains of chronic neuropathic pain in bilateral lower extremities and acute right low back pain and right abdominal pain.      PLAN:  CVST :  Hypercoagulable labs negative Continue Eliquis  5mg  twice daily managed by hematology who recommends continuing for at least 1 year (found to have PE and age-indeterminate DVT 01/2023) MRV and MRI brain 03/2023 partially recanalized but persistent thrombosis with slight improvement compared to prior  imaging (imaging completed earlier than requested/recommended) MRI brain 01/2023 extensive thrombosis of superior sagittal sinus, right transverse sinus, extending into upper right internal jugular vein.  Lesser nonocclusive thrombus within the left transverse and sigmoid venous sinuses.  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 Does have B12 deficiency but has since resolved on supplement A1c 5.3 01/2023 discussed pursing EMG/NCV for further evaluation but declines interest at this time, he would be interested in testing if symptoms should progress Recommend trying gabapentin  300 mg twice daily for symptomatic management  Acute low back pain Suspect musculoskeletal strain, no radiculopathy Start steroid taper pack, advised gabapentin  may also provide benefit Follow up with PCP if symptoms persist  Right side abdominal pain: Appears to be more skin sensitivity concern - currently no evidence of rash or lesions but advised to monitor. Advised to f/u with PCP if symptoms persist.   Tobacco use:  Discussed importance of complete tobacco cessation as continued use greatly increases risk of cardiovascular disease and other health related issues.  Advised him to follow-up with PCP if assistance is needed      Follow up in 6 months or call earlier if needed    CC:  [facility] provider: Dr. Rosemarie PCP: Bertell Satterfield, MD    I personally spent a total of *** minutes in the care of the patient today including {Time Based Coding:210964241}.    Harlene Bogaert, AGNP-BC  [facility] [address] [place], [place] [address]  Phone [phone] Fax [phone] Note: This document was prepared with digital dictation and possible smart phrase technology. Any transcriptional errors that result from this process are unintentional.

## 2024-02-12 ENCOUNTER — Encounter: Payer: Self-pay | Admitting: Adult Health

## 2024-02-12 ENCOUNTER — Ambulatory Visit: Payer: BC Managed Care – PPO | Admitting: Adult Health

## 2024-02-12 VITALS — BP 139/82 | HR 78 | Ht 69.0 in | Wt 199.0 lb

## 2024-02-12 DIAGNOSIS — E7211 Homocystinuria: Secondary | ICD-10-CM | POA: Diagnosis not present

## 2024-02-12 DIAGNOSIS — E538 Deficiency of other specified B group vitamins: Secondary | ICD-10-CM

## 2024-02-12 DIAGNOSIS — G629 Polyneuropathy, unspecified: Secondary | ICD-10-CM | POA: Diagnosis not present

## 2024-02-12 DIAGNOSIS — M792 Neuralgia and neuritis, unspecified: Secondary | ICD-10-CM

## 2024-02-12 DIAGNOSIS — G08 Intracranial and intraspinal phlebitis and thrombophlebitis: Secondary | ICD-10-CM

## 2024-02-12 MED ORDER — LIDOCAINE-PRILOCAINE 2.5-2.5 % EX CREA: 1.0000 | TOPICAL_CREAM | CUTANEOUS | 0 refills | Status: AC | PRN

## 2024-02-12 MED ORDER — DULOXETINE HCL 30 MG PO CPEP: 30.0000 mg | ORAL_CAPSULE | Freq: Every day | ORAL | 11 refills | Status: DC

## 2024-02-12 NOTE — Patient Instructions (Addendum)
 Your Plan:  Continue gabapentin  300mg  every morning and 600mg  nightly   Start duloxetine  30mg  daily   Start Elma ointment as needed for painful symptoms  Would recommend looking into shoe orthotics as this can also help with nerve pain while up and moving    Continue to follow with hematology as scheduled next month  Continue to follow with PCP for stroke risk factor management      Follow up in 6 months or call earlier if needed      Thank you for coming to see us  at [facility]. I hope we have been able to provide you high quality care today.  You may receive a patient satisfaction survey over the next few weeks. We would appreciate your feedback and comments so that we may continue to improve ourselves and the health of our patients.

## 2024-02-14 LAB — HEMOGLOBIN A1C
Est. average glucose Bld gHb Est-mCnc: 108 mg/dL
Hgb A1c MFr Bld: 5.4 % (ref 4.8–5.6)

## 2024-02-14 LAB — TSH: TSH: 1.14 u[IU]/mL (ref 0.450–4.500)

## 2024-02-14 LAB — B12 AND FOLATE PANEL
Folate: >20 ng/mL (ref >3.0)
Vitamin B-12: 1379 pg/mL — ABNORMAL HIGH (ref 232–1245)

## 2024-02-14 LAB — HOMOCYSTEINE: Homocysteine: 10.9 umol/L (ref 0.0–17.2)

## 2024-02-16 ENCOUNTER — Ambulatory Visit (HOSPITAL_COMMUNITY)
Admission: RE | Admit: 2024-02-16 | Discharge: 2024-02-16 | Disposition: A | Discharge status: Home / Self Care | Source: Ambulatory Visit | Attending: Acute Care | Admitting: Acute Care

## 2024-02-16 ENCOUNTER — Ambulatory Visit: Payer: Self-pay | Admitting: Adult Health

## 2024-02-16 DIAGNOSIS — Z87891 Personal history of nicotine dependence: Secondary | ICD-10-CM | POA: Diagnosis present

## 2024-02-16 DIAGNOSIS — R911 Solitary pulmonary nodule: Secondary | ICD-10-CM | POA: Insufficient documentation

## 2024-02-16 DIAGNOSIS — Z122 Encounter for screening for malignant neoplasm of respiratory organs: Secondary | ICD-10-CM | POA: Insufficient documentation

## 2024-02-16 LAB — GLUCOSE, CAPILLARY: Glucose-Capillary: 114 mg/dL — ABNORMAL HIGH (ref 70–99)

## 2024-02-16 MED ORDER — FLUDEOXYGLUCOSE F - 18 (FDG) INJECTION
9.9400 | Freq: Once | INTRAVENOUS | Status: AC | PRN
  Administered 2024-02-16: 9.94 via INTRAVENOUS

## 2024-02-27 ENCOUNTER — Other Ambulatory Visit: Payer: Self-pay | Admitting: *Deleted

## 2024-02-27 ENCOUNTER — Ambulatory Visit: Admitting: Acute Care

## 2024-02-27 ENCOUNTER — Encounter: Payer: Self-pay | Admitting: Acute Care

## 2024-02-27 VITALS — BP 128/72 | HR 62 | Ht 69.0 in | Wt 196.6 lb

## 2024-02-27 DIAGNOSIS — R911 Solitary pulmonary nodule: Secondary | ICD-10-CM

## 2024-02-27 DIAGNOSIS — F17210 Nicotine dependence, cigarettes, uncomplicated: Secondary | ICD-10-CM

## 2024-02-27 DIAGNOSIS — J449 Chronic obstructive pulmonary disease, unspecified: Secondary | ICD-10-CM | POA: Diagnosis not present

## 2024-02-27 DIAGNOSIS — Z87891 Personal history of nicotine dependence: Secondary | ICD-10-CM

## 2024-02-27 MED ORDER — UMECLIDINIUM-VILANTEROL 62.5-25 MCG/ACT IN AEPB: 1.0000 | INHALATION_SPRAY | Freq: Every day | RESPIRATORY_TRACT | 0 refills | Status: DC

## 2024-02-27 MED ORDER — ALBUTEROL SULFATE HFA 108 (90 BASE) MCG/ACT IN AERS
2.0000 | INHALATION_SPRAY | Freq: Four times a day (QID) | RESPIRATORY_TRACT | 0 refills | Status: DC | PRN
Start: 2024-02-27 — End: 2024-03-25

## 2024-02-27 NOTE — Progress Notes (Signed)
 History of Present Illness Isaac Rose is a 63 y.o. male current every day smoker with a 160 pack year smoking history, followed through the lung cancer screening program. Referred for lung nodule consult after abnormal lung cancer screening scan 02/2024. He will be followed by Dr. Shelah.   02/27/2024 Pt. Presents for follow up. He states he has been doing well. No hemoptysis or unintentional weight loss. We have reviewed his PET scan. The nodule of concern had no significant abnormal uptake, which is reassuring.. This could be infectious or inflammatory. Plan will be for a 3 month follow up scan through the screening program. Patient is in agreement with this plan.  Pt. Is not compliant with his Anoro, and he is out of his albuterol . I have renewed both. I think cost may be an issue. I have provided him with the [employer] paperwork to see if he qualifies for assistance.   Test Results: PET Scan 02/16/2024  IMPRESSION: The nodular in question along the superior segment of the right lower lobe is smaller today by CT scan has no significant abnormal radiotracer uptake. This could be infectious or inflammatory. Recommend simple follow up surveillance with CT scan in 3 months to assess for evolution.  Mediastinum/Nodes: No mediastinal lymphadenopathy. Limited hilar assessment in the absence of IV contrast. Unremarkable esophagus.   Lungs/Pleura: Moderate emphysema with mild diffuse bronchial thickening. Minimal retained secretions in the tracheobronchial tree. No consolidative airspace disease. No pleural fluid. Multiple bilateral pulmonary nodules, largest measuring 5.1 mm mean diameter, are unchanged from prior exam. Subpleural nodular opacity in the superior segment of the right lower lobe with a mean diameter of 13.7 mm, series 9, image 154, is new from 2020 exam. Airspace disease was seen on 2024 chest CT, this is indeterminate for nodule versus scarring. New subpleural 5.2 mm nodular  density in the right lower lobe, series 9, image 266.   LDCT Chest 01/2024 Upper Abdomen: No acute upper abdominal findings. Stable left adrenal thickening.   Musculoskeletal: There are no acute or suspicious osseous abnormalities.   IMPRESSION: 1. Lung-RADS 4B, suspicious. Additional imaging evaluation or consultation with Pulmonology or Thoracic Surgery recommended. Subpleural nodular opacity in the right lower lobe measuring 13.7 mm mean diameter is new from 2020 exam. This is at site of previous airspace disease on intervening chest CT, and indeterminate for scarring versus nodule. Could be considered to assess for metabolic activity. 2. New subpleural nodular density in the right lower lobe with mean diameter of 5.2 mm, also indeterminate for nodule versus scarring. In isolation, this is a Lung-RADS 3 nodule. 3. Additional pulmonary nodules are stable. 4. Coronary artery calcifications. 5. Aortic Atherosclerosis (ICD10-I70.0) and Emphysema (ICD10-J43.9).     Latest Ref Rng & Units 01/16/2023   11:51 AM 01/15/2023    3:15 AM 01/14/2023    4:45 AM  CBC  WBC 4.0 - 10.5 K/uL 9.2  8.4  8.3   Hemoglobin 13.0 - 17.0 g/dL 87.0  87.1  86.8   Hematocrit 39.0 - 52.0 % 37.0  36.4  37.7   Platelets 150 - 400 K/uL 433  381  367        Latest Ref Rng & Units 03/17/2023   11:20 AM 01/16/2023   11:51 AM 01/15/2023   10:24 AM  BMP  Glucose 70 - 99 mg/dL 93  87  80   BUN 8 - 27 mg/dL 9  6  10    Creatinine 0.76 - 1.27 mg/dL 8.90  9.33  0.59   BUN/Creat Ratio 10 - 24 8     Sodium 134 - 144 mmol/L 143  131  127   Potassium 3.5 - 5.2 mmol/L 4.8  4.2  4.2   Chloride 96 - 106 mmol/L 103  99  94   CO2 20 - 29 mmol/L 23  24  24    Calcium  8.6 - 10.2 mg/dL 9.9  8.4  8.5     BNP    Component Value Date/Time   BNP 30.0 06/05/2018 1022    ProBNP No results found for: PROBNP  PFT    Component Value Date/Time   FEV1PRE 2.72 02/03/2019 0838   FEV1POST 3.35 02/03/2019 0838   FVCPRE  4.57 02/03/2019 0838   FVCPOST 4.81 02/03/2019 0838   TLC 8.51 02/03/2019 0838   DLCOUNC 22.53 02/03/2019 0838   PREFEV1FVCRT 59 02/03/2019 0838   PSTFEV1FVCRT 70 02/03/2019 0838    NM PET Image Initial (PI) Skull Base To Thigh Result Date: 02/17/2024 CLINICAL DATA:  Initial treatment strategy for lung nodule. EXAM: NUCLEAR MEDICINE PET SKULL BASE TO THIGH TECHNIQUE: 9.94 mCi F-18 FDG was injected intravenously. Full-ring PET imaging was performed from the skull base to thigh after the radiotracer. CT data was obtained and used for attenuation correction and anatomic localization. Fasting blood glucose: 114 mg/dl COMPARISON:  Noncontrast CT 01/12/2024 FINDINGS: Mediastinal blood pool activity: SUV max 2.5 Liver activity: SUV max 2.6 NECK: No specific abnormal uptake seen in the neck including along lymph node change of the submandibular, posterior triangle or internal jugular region. Near symmetric uptake of the visualized intracranial compartment. Incidental CT findings: The parotid glands, submandibular glands and thyroid glands unremarkable. Paranasal sinuses and mastoid air cells are clear. Mild scattered vascular calcifications in the neck. CHEST: No specific abnormal uptake above blood pool in the axillary regions, hilum or mediastinum. No abnormal lung uptake. On the prior CT scan there is a subpleural nodule in the superior segment of the right lower lobe which is actually smaller and less confluent today. Previous dimension of up to 13.7 mm and today area measures 10 mm on image 92 of series 4. This focus does not show any abnormal uptake. Maximum SUV value of 1.9. The other small areas of nodularity are also without areas of corresponding abnormal radiotracer uptake. Incidental CT findings: Emphysematous lung changes. Dependent atelectasis. No pleural effusion or pneumothorax. Diffuse breathing motion. Heart is nonenlarged. No pericardial effusion. Scattered vascular calcifications along the aorta  and coronary arteries. Please correlate for other coronary risk factors. Normal course and caliber to the thoracic esophagus. ABDOMEN/PELVIS: No abnormal hypermetabolic activity within the liver, pancreas, adrenal glands, or spleen. No hypermetabolic lymph nodes in the abdomen or pelvis. Incidental CT findings: On this limited noncontrast, attenuation correction CT, grossly the liver, spleen, adrenal glands, pancreas are unremarkable. Gallbladder is present. Punctate nonobstructing left-sided renal stones. No definite ureteral stones. Contracted urinary bladder. The bowel overall is nondilated. There is some scattered colonic stool. Question slight fold thickening along the stomach. Again no abnormal uptake. Diffuse vascular calcifications along the aorta and branch vessels. SKELETON: No abnormal uptake along the visualized osseous structures. Incidental CT findings: Scattered degenerative changes. IMPRESSION: The nodular in question along the superior segment of the right lower lobe is smaller today by CT scan has no significant abnormal radiotracer uptake. This could be infectious or inflammatory. Recommend simple follow up surveillance with CT scan in 3 months to assess for evolution. No specific abnormal soft tissue areas uptake. Electronically Signed  By: Ranell Bring M.D.   On: 02/17/2024 16:23     Past medical hx Past Medical History:  Diagnosis Date   Chronic constipation    COPD (chronic obstructive pulmonary disease) (HCC)    Depression    GERD (gastroesophageal reflux disease)    Headache    Hypertension      Social History   Tobacco Use   Smoking status: Every Day    Current packs/day: 3.00    Average packs/day: 3.0 packs/day for 53.6 years (160.7 ttl pk-yrs)    Types: Cigarettes    Start date: 60   Smokeless tobacco: Former    Types: Chew    Quit date: 04/29/1987   Tobacco comments:    1 1/2 pack a day 35 yrs patient is aware he needs to quit   Vaping Use   Vaping status:  Never Used  Substance Use Topics   Alcohol use: Yes    Alcohol/week: 0.0 standard drinks of alcohol    Comment: occasionally   Drug use: No    Mr.Witham reports that he has been smoking cigarettes. He started smoking about 53 years ago. He has a 160.7 pack-year smoking history. He quit smokeless tobacco use about 36 years ago.  His smokeless tobacco use included chew. He reports current alcohol use. He reports that he does not use drugs.  Tobacco Cessation: Ready to quit: Not Answered Counseling given: Not Answered Tobacco comments: 1 1/2 pack a day 35 yrs patient is aware he needs to quit  Current everyday smoker with a 160-pack-year smoking history Current every day smoker , I spent 3-4 minutes counseling patient on  steps to stop use of tobacco products. I have provided patient with information on receiving free nicotine  replacement therapy, and contact numbers for hypnosis for smoking cessation as well as acupuncture for smoking cessation.   Past surgical hx, Family hx, Social hx all reviewed.  Current Outpatient Medications on File Prior to Visit  Medication Sig   acetaminophen (TYLENOL) 500 MG tablet Take 500-1,000 mg by mouth daily as needed for mild pain or headache.   amLODipine  (NORVASC ) 10 MG tablet Take 10 mg by mouth daily.   apixaban  (ELIQUIS ) 5 MG TABS tablet Take 1 tablet (5 mg total) by mouth 2 (two) times daily.   cyanocobalamin  1000 MCG tablet Take 1 tablet (1,000 mcg total) by mouth daily.   DULoxetine  (CYMBALTA ) 30 MG capsule Take 1 capsule (30 mg total) by mouth daily.   famotidine  (PEPCID ) 20 MG tablet Take 1 tablet (20 mg total) by mouth daily.   folic acid  (FOLVITE ) 1 MG tablet Take 1 tablet (1 mg total) by mouth daily.   gabapentin  (NEURONTIN ) 300 MG capsule Take 1 capsule (300 mg total) by mouth 2 (two) times daily. (Patient taking differently: Take 300 mg by mouth 3 (three) times daily.)   labetalol  (NORMODYNE ) 300 MG tablet Take 300 mg by mouth 2 (two)  times daily.   lidocaine -prilocaine  (EMLA ) cream Apply 1 Application topically as needed.   losartan  (COZAAR ) 50 MG tablet Take 1 tablet (50 mg total) by mouth daily.   methylPREDNISolone  (MEDROL  DOSEPAK) 4 MG TBPK tablet Taper pack as directed   Multiple Vitamins-Minerals ([employer] SPECTRAVITE ADULTS) TABS Take 1 tablet by mouth daily.   OVER THE COUNTER MEDICATION CeraVite/ Antioxidant daily   pantoprazole  (PROTONIX ) 40 MG tablet Take 1 tablet (40 mg total) by mouth 2 (two) times daily.   pyridOXINE  (B-6) 50 MG tablet Take 1 tablet (50 mg total) by  mouth daily for 30 days then as directed by MD   SENEXON-S 8.6-50 MG tablet Take 1 tablet by mouth daily.   thiamine  (VITAMIN B1) 100 MG tablet Take 1 tablet (100 mg total) by mouth daily.   umeclidinium-vilanterol (ANORO ELLIPTA ) 62.5-25 MCG/ACT AEPB Inhale 1 puff into the lungs daily.   No current facility-administered medications on file prior to visit.     Allergies  Allergen Reactions   Effexor [Venlafaxine Hydrochloride] Other (See Comments)    unknown    Review Of Systems:  Constitutional:   No  weight loss, night sweats,  Fevers, chills, fatigue, or  lassitude.  HEENT:   No headaches,  Difficulty swallowing,  Tooth/dental problems, or  Sore throat,                No sneezing, itching, ear ache, nasal congestion, post nasal drip,   CV:  No chest pain,  Orthopnea, PND, swelling in lower extremities, anasarca, dizziness, palpitations, syncope.   GI  No heartburn, indigestion, abdominal pain, nausea, vomiting, diarrhea, change in bowel habits, loss of appetite, bloody stools.   Resp: + shortness of breath with exertion less at rest.  + baseline  excess mucus, + baseline  productive cough,  + baseline  non-productive cough,  No coughing up of blood.  No change in color of mucus.  No wheezing.  No chest wall deformity  Skin: no rash or lesions.  GU: no dysuria, change in color of urine, no urgency or frequency.  No flank pain, no  hematuria   MS:  No joint pain or swelling.  No decreased range of motion.  No back pain.  Psych:  No change in mood or affect. No depression or anxiety.  No memory loss.   Vital Signs BP 128/72 (BP Location: Left Arm, Patient Position: Sitting, Cuff Size: Normal)   Pulse 62   Ht 5' 9 (1.753 m)   Wt 196 lb 9.6 oz (89.2 kg)   SpO2 97%   BMI 29.03 kg/m    Physical Exam:  General- No distress,  A&Ox3, pleasant ENT: No sinus tenderness, TM clear, pale nasal mucosa, no oral exudate,no post nasal drip, no LAN Cardiac: S1, S2, regular rate and rhythm, no murmur Chest: No wheeze/ rales/ dullness; no accessory muscle use, no nasal flaring, no sternal retractions, diminished per bases Abd.: Soft Non-tender, ND, BS +, Body mass index is 29.03 kg/m.  Ext: No clubbing cyanosis, edema, no obvious deformities Neuro:  normal strength, MAE x 4, A&O x 3, pleasant Skin: No rashes, warm and dry, no obvious skin lesions  Psych: normal mood and behavior   Assessment/Plan Subpleural nodular opacity in the right lower lobe measuring 13.7 mm mean diameter is new from 2020 exam Current every day smoker  Plan It is good to see you today. The nodule of concern on your recent lung cancer screening scan did not show any significant uptake on the PET scan. This is very reassuring. It leans more towards an infectious or inflammatory cause. Radiology recommended continued close surveillance. Plan will be for 84-month follow-up low-dose CT through the lung cancer screening program. This will be due in October 2025. You will get a call to get this scheduled closer to the time. You will follow-up with me to review the results of the scan after it has been completed. If the nodule remains stable we will return you to annual screening through the lung cancer screening program. Call for any unexplained weight loss or blood in  your sputum so we can get you into be seen sooner. Please work on quitting  smoking. You can receive free nicotine  replacement therapy (patches, gum, or mints) by calling 1-800-QUIT NOW. Please call so we can get you on the path to becoming a non-smoker. I know it is hard, but you can do this!  Hypnosis for smoking cessation  [employer] [phone]  Acupuncture for smoking cessation  [employer] [phone]     I spent 30 minutes dedicated to the care of this patient on the date of this encounter to include pre-visit review of records, face-to-face time with the patient discussing conditions above, post visit ordering of testing, clinical documentation with the electronic health record, making appropriate referrals as documented, and communicating necessary information to the patient's healthcare team.   Lauraine JULIANNA Lites, NP 02/27/2024  9:35 AM

## 2024-02-27 NOTE — Patient Instructions (Addendum)
 It is good to see you today. The nodule of concern on your recent lung cancer screening scan did not show any significant uptake on the PET scan. This is very reassuring. It leans more towards an infectious or inflammatory cause. Radiology recommended continued close surveillance. Plan will be for 61-month follow-up low-dose CT through the lung cancer screening program. This will be due in October 2025. You will get a call to get this scheduled closer to the time. You will follow-up with me to review the results of the scan after it has been completed. If the nodule remains stable we will return you to annual screening through the lung cancer screening program. Call for any unexplained weight loss or blood in your sputum so we can get you into be seen sooner. I have renewed your albuterol  inhaler. Use as needed for shortness of breath or wheezing no more than 3 times a day. If you need it more, please call to be seen.  I have renewed your Anoro 1 puff once daily , every day without fail. Rinse mouth after use. Please work on quitting smoking. You can receive free nicotine  replacement therapy (patches, gum, or mints) by calling 1-800-QUIT NOW. Please call so we can get you on the path to becoming a non-smoker. I know it is hard, but you can do this!  Hypnosis for smoking cessation  [employer] [phone]  Acupuncture for smoking cessation  [employer] [phone]

## 2024-03-02 NOTE — Progress Notes (Signed)
 I agree with the plans as outlined above.   Lamar Chris, MD, PhD 03/02/2024, 3:25 PM [facility] [phone] or if no answer before 7:00PM call [phone] For any issues after 7:00PM please call eLink [phone]

## 2024-03-05 ENCOUNTER — Other Ambulatory Visit: Payer: Self-pay | Admitting: Adult Health

## 2024-03-11 ENCOUNTER — Inpatient Hospital Stay: Payer: 59 | Attending: Oncology

## 2024-03-11 DIAGNOSIS — Z86711 Personal history of pulmonary embolism: Secondary | ICD-10-CM | POA: Insufficient documentation

## 2024-03-11 DIAGNOSIS — F17210 Nicotine dependence, cigarettes, uncomplicated: Secondary | ICD-10-CM | POA: Insufficient documentation

## 2024-03-11 DIAGNOSIS — R918 Other nonspecific abnormal finding of lung field: Secondary | ICD-10-CM | POA: Diagnosis not present

## 2024-03-11 DIAGNOSIS — I2694 Multiple subsegmental pulmonary emboli without acute cor pulmonale: Secondary | ICD-10-CM

## 2024-03-11 DIAGNOSIS — Z7901 Long term (current) use of anticoagulants: Secondary | ICD-10-CM | POA: Insufficient documentation

## 2024-03-11 LAB — CBC WITH DIFFERENTIAL/PLATELET
Abs Immature Granulocytes: 0.01 K/uL (ref 0.00–0.07)
Basophils Absolute: 0.1 K/uL (ref 0.0–0.1)
Basophils Relative: 1 %
Eosinophils Absolute: 0.3 K/uL (ref 0.0–0.5)
Eosinophils Relative: 3 %
HCT: 46.4 % (ref 39.0–52.0)
Hemoglobin: 15.2 g/dL (ref 13.0–17.0)
Immature Granulocytes: 0 %
Lymphocytes Relative: 39 %
Lymphs Abs: 3.1 K/uL (ref 0.7–4.0)
MCH: 32.8 pg (ref 26.0–34.0)
MCHC: 32.8 g/dL (ref 30.0–36.0)
MCV: 100.2 fL — ABNORMAL HIGH (ref 80.0–100.0)
Monocytes Absolute: 0.7 K/uL (ref 0.1–1.0)
Monocytes Relative: 9 %
Neutro Abs: 3.8 K/uL (ref 1.7–7.7)
Neutrophils Relative %: 48 %
Platelets: 216 K/uL (ref 150–400)
RBC: 4.63 MIL/uL (ref 4.22–5.81)
RDW: 13.2 % (ref 11.5–15.5)
WBC: 8 K/uL (ref 4.0–10.5)
nRBC: 0 % (ref 0.0–0.2)

## 2024-03-11 LAB — D-DIMER, QUANTITATIVE: D-Dimer, Quant: 0.9 ug{FEU}/mL — ABNORMAL HIGH (ref 0.00–0.50)

## 2024-03-18 ENCOUNTER — Inpatient Hospital Stay (HOSPITAL_BASED_OUTPATIENT_CLINIC_OR_DEPARTMENT_OTHER): Payer: 59 | Admitting: Oncology

## 2024-03-18 VITALS — BP 130/89 | HR 63 | Temp 97.9°F | Resp 16 | Wt 196.9 lb

## 2024-03-18 DIAGNOSIS — I2694 Multiple subsegmental pulmonary emboli without acute cor pulmonale: Secondary | ICD-10-CM

## 2024-03-18 DIAGNOSIS — Z86711 Personal history of pulmonary embolism: Secondary | ICD-10-CM | POA: Diagnosis not present

## 2024-03-18 DIAGNOSIS — Z720 Tobacco use: Secondary | ICD-10-CM | POA: Diagnosis not present

## 2024-03-18 NOTE — Progress Notes (Signed)
 [facility] OFFICE PROGRESS NOTE  Bertell Satterfield, MD  ASSESSMENT & PLAN:  Assessment & Plan Multiple subsegmental pulmonary emboli without acute cor pulmonale (HCC) - He continues to have bitemporal headaches on and off-some are more severe than others. - Thrombotic episodes most likely unprovoked/weakly provoked. - Hypercoagulable testing from 02/03/23 was negative for lupus anticoagulant, PNH panel, protein C, protein S, Antithrombin III , JAK2 V617F and reflex testing. -Discussed case with Dr. Davonna who recommends long-term anticoagulation based on clot burden and unprovoked weakly provoked clots. -D-dimer from 03/11/24 was .90 (0.88).  CBC unremarkable. -No signs or symptoms of recurrent blood clots at this time. -Continue Eliquis  5 mg twice daily.   - Recommend follow-up in 6 months with D-dimer.  Tobacco abuse -Patient has annual low-dose CT scans and most recent scan from 01/12/2024 showed lung RADS 4B suspicious.  PET scan was ordered showed the nodular area in question along the superior segment of right lower lobe is smaller today by CT scan has no significant abnormal radiotracer uptake.  This could be infectious or inflammatory.  Recommend simple follow-up with surveillance CT scan in 3 months to assess for changes.  His PCP will take care of this follow-up. -He still smokes cigarettes daily but is trying to cut back.   Orders Placed This Encounter  Procedures   CBC with Differential/Platelet    Standing Status:   Future    Expected Date:   09/18/2024    Expiration Date:   03/18/2025   D-dimer, quantitative    Standing Status:   Future    Expected Date:   09/12/2024    Expiration Date:   03/18/2025    INTERVAL HISTORY: Patient reports doing fairly well over the past 6 months.  He will occasionally have a tingling sensation in the back of his head and temporal headaches.  Appetite and energy levels are fair.  No pain.  He does have occasional dizzy  spells.  Reports he was put on gabapentin  for numbness and tingling in his lower extremities but she does not feel like it is helping.  States he cannot walk around his house without shoes on because the sensation is so painful.  He is wondering if he will ever be able to come off Eliquis .  He recently had a CT scan of his chest that was read as lung RADS category B suspicious.  He had a subsequent PET scan which showed the nodular area in question along the superior segment of right lower lobe is smaller today by CT scan has no significant abnormal radiotracer uptake.  This could be infectious or inflammatory.  Recommend simple follow-up with surveillance CT scan in 3 months to assess for changes.  His PCP will take care of this follow-up.  Overall feels like he is doing well.  Appetite is 100% energy levels are 25%.  He continues to smoke cigarettes daily.  Reports he has significant fatigue.  States he wakes up around 8 or 9 and he is back in bed by 3 PM and sleeps through the rest of the night.  SUMMARY OF HEMATOLOGIC HISTORY: - Admission to hospital from 01/07/2023 through 01/16/2023, after found confused on the floor at his home.  He was also found to be hyponatremic.  He was reportedly sick with vomiting and decreased appetite and decreased activity for a few days prior to presentation to the ER. - CT head without contrast (01/07/2023): Relatively dense appearance of the dural venous sinuses, particularly right transverse/sigmoid  sinus. - MRI brain (01/08/2023): No infarction.  Chronic small vessel ischemic changes.  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  Lesser nonocclusive thrombus within the left transverse and sigmoid venous sinuses.  Some superficial venous thrombosis over the parietal convexities as well. - CT CAP (01/10/2023): Acute pulmonary embolism with moderate embolic burden with no evidence of right heart strain.   No evidence of malignancy. - No prior history of thrombosis.  Last colonoscopy in 2008.  He lost about 10-15 pounds in the last 1 year.    No results found for: CBC  Vitals:   03/18/24 1312  BP: 130/89  Pulse: 63  Resp: 16  Temp: 97.9 F (36.6 C)  SpO2: 98%   Review of Systems  Constitutional:  Positive for malaise/fatigue.  Respiratory:  Positive for cough.   Neurological:  Positive for dizziness, sensory change and headaches.  Psychiatric/Behavioral:  Positive for depression.    Physical Exam Constitutional:      Appearance: Normal appearance.  Cardiovascular:     Rate and Rhythm: Normal rate and regular rhythm.  Pulmonary:     Effort: Pulmonary effort is normal.     Breath sounds: Normal breath sounds.  Abdominal:     General: Bowel sounds are normal.     Palpations: Abdomen is soft.  Musculoskeletal:        General: No swelling. Normal range of motion.  Neurological:     Mental Status: He is alert and oriented to person, place, and time. Mental status is at baseline.    I spent 25 minutes dedicated to the care of this patient (face-to-face and non-face-to-face) on the date of the encounter to include what is described in the assessment and plan.,  Delon Hope, NP 03/18/2024 2:38 PM

## 2024-03-18 NOTE — Assessment & Plan Note (Signed)
-  Patient has annual low-dose CT scans and most recent scan from 01/12/2024 showed lung RADS 4B suspicious.  PET scan was ordered showed the nodular area in question along the superior segment of right lower lobe is smaller today by CT scan has no significant abnormal radiotracer uptake.  This could be infectious or inflammatory.  Recommend simple follow-up with surveillance CT scan in 3 months to assess for changes.  His PCP will take care of this follow-up. -He still smokes cigarettes daily but is trying to cut back.

## 2024-03-18 NOTE — Assessment & Plan Note (Addendum)
-   He continues to have bitemporal headaches on and off-some are more severe than others. - Thrombotic episodes most likely unprovoked/weakly provoked. - Hypercoagulable testing from 02/03/23 was negative for lupus anticoagulant, PNH panel, protein C, protein S, Antithrombin III , JAK2 V617F and reflex testing. -Discussed case with Dr. Davonna who recommends long-term anticoagulation based on clot burden and unprovoked weakly provoked clots. -D-dimer from 03/11/24 was .90 (0.88).  CBC unremarkable. -No signs or symptoms of recurrent blood clots at this time. -Continue Eliquis  5 mg twice daily.   - Recommend follow-up in 6 months with D-dimer.

## 2024-03-18 NOTE — Progress Notes (Deleted)
   [facility] OFFICE PROGRESS NOTE  Bertell Satterfield, MD  ASSESSMENT & PLAN:  Assessment & Plan     No orders of the defined types were placed in this encounter.   INTERVAL HISTORY: Patient returns for recurrent anemia Symptoms of anemia includes {Symptoms; anemia:12350} We reviewed ***  SUMMARY OF HEMATOLOGIC HISTORY: - Recent admission to hospital from 01/07/2023 through 01/16/2023, after found confused on the floor at his home.  He was also found to be hyponatremic.  He was reportedly sick with vomiting and decreased appetite and decreased activity for a few days prior to presentation to the ER. - CT head without contrast (01/07/2023): Relatively dense appearance of the dural venous sinuses, particularly right transverse/sigmoid sinus. - MRI brain (01/08/2023): No infarction.  Chronic small vessel ischemic changes.  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  Lesser nonocclusive thrombus within the left transverse and sigmoid venous sinuses.  Some superficial venous thrombosis over the parietal convexities as well. - CT CAP (01/10/2023): Acute pulmonary embolism with moderate embolic burden with no evidence of right heart strain.  No evidence of malignancy. - No prior history of thrombosis.  Last colonoscopy in 2008.  He lost about 10-15 pounds in the last 1 year.  No results found for: CBC  Vitals:   03/18/24 1312  BP: 130/89  Pulse: 63  Resp: 16  Temp: 97.9 F (36.6 C)  SpO2: 98%    I spent *** minutes dedicated to the care of this patient (face-to-face and non-face-to-face) on the date of the encounter to include what is described in the assessment and plan.,  Delon Hope, NP 03/18/2024 1:34 PM

## 2024-03-25 ENCOUNTER — Other Ambulatory Visit: Payer: Self-pay | Admitting: Acute Care

## 2024-03-25 DIAGNOSIS — J449 Chronic obstructive pulmonary disease, unspecified: Secondary | ICD-10-CM

## 2024-03-30 ENCOUNTER — Other Ambulatory Visit: Payer: Self-pay | Admitting: Acute Care

## 2024-03-30 DIAGNOSIS — J449 Chronic obstructive pulmonary disease, unspecified: Secondary | ICD-10-CM

## 2024-04-28 ENCOUNTER — Encounter: Payer: Self-pay | Admitting: Acute Care

## 2024-05-19 ENCOUNTER — Ambulatory Visit
Admission: RE | Admit: 2024-05-19 | Discharge: 2024-05-19 | Disposition: A | Discharge status: Home / Self Care | Source: Ambulatory Visit | Attending: Acute Care | Admitting: Acute Care

## 2024-05-19 DIAGNOSIS — Z87891 Personal history of nicotine dependence: Secondary | ICD-10-CM

## 2024-05-19 DIAGNOSIS — R911 Solitary pulmonary nodule: Secondary | ICD-10-CM

## 2024-05-24 ENCOUNTER — Other Ambulatory Visit: Payer: Self-pay

## 2024-05-24 DIAGNOSIS — Z87891 Personal history of nicotine dependence: Secondary | ICD-10-CM

## 2024-05-24 DIAGNOSIS — F17210 Nicotine dependence, cigarettes, uncomplicated: Secondary | ICD-10-CM

## 2024-05-24 DIAGNOSIS — Z122 Encounter for screening for malignant neoplasm of respiratory organs: Secondary | ICD-10-CM

## 2024-05-28 ENCOUNTER — Encounter: Payer: Self-pay | Admitting: Acute Care

## 2024-05-28 ENCOUNTER — Ambulatory Visit: Admitting: Acute Care

## 2024-05-28 VITALS — BP 110/72 | HR 63 | Temp 97.9°F | Ht 69.0 in | Wt 200.2 lb

## 2024-05-28 DIAGNOSIS — R911 Solitary pulmonary nodule: Secondary | ICD-10-CM | POA: Diagnosis not present

## 2024-05-28 DIAGNOSIS — J449 Chronic obstructive pulmonary disease, unspecified: Secondary | ICD-10-CM

## 2024-05-28 DIAGNOSIS — K219 Gastro-esophageal reflux disease without esophagitis: Secondary | ICD-10-CM | POA: Diagnosis not present

## 2024-05-28 DIAGNOSIS — F17210 Nicotine dependence, cigarettes, uncomplicated: Secondary | ICD-10-CM | POA: Diagnosis not present

## 2024-05-28 DIAGNOSIS — R9389 Abnormal findings on diagnostic imaging of other specified body structures: Secondary | ICD-10-CM

## 2024-05-28 DIAGNOSIS — F17200 Nicotine dependence, unspecified, uncomplicated: Secondary | ICD-10-CM

## 2024-05-28 NOTE — Progress Notes (Signed)
 History of Present Illness Isaac Rose is a 63 y.o. male current every day smoker with a 160 pack year smoking history,and COPD,  followed through the lung cancer screening program. Referred for lung nodule consult after abnormal lung cancer screening scan 02/2024. He will be followed by Dr. Shelah.    05/28/2024 Discussed the use of AI scribe software for clinical note transcription with the patient, who gave verbal consent to proceed.  Synopsis Current every day smoker with abnormal lung cancer screening scan referred 02/2024 for an abnormal scan. PET scan was done which showed the nodule of concern was negative for uptake which was reassuring. This could have been an infectious or inflammatory nodule. Plan was for a 3 month follow up scan . At the visit we also renewed his Anoro, and albuterol , and provided him with the [employer] paperwork to see if he qualifies for assistance.   History of Present Illness Isaac Rose is a 63 year old male who presents for a follow-up on a repeat CT scan after a previous PET scan.  The repeat CT scan showed a decrease in the size of the area of concern from 13.7 mm in June to 12.1 mm.  He continues to smoke and uses Anoro daily, typically in the morning, which provides some relief. He also uses albuterol  two to three times a day. He has tried Trelegy in the past but is unsure of its effectiveness.  He experiences nocturnal wheezing when lying in bed, which he attempts to alleviate by coughing. He does not use albuterol  at night for this symptom. He has a history of gastroesophageal reflux disease and is on Pepcid  and Protonix  daily. He enjoys spicy foods which may exacerbate his reflux.  His weight is up, and he is very inactive. Recommended increased activity for weigh loss, and general physical conditioning..   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     Test Results: LDCT 05/19/2024  Lung-RADS 2, benign appearance or  behavior. Continue annual screening with low-dose chest CT without contrast in 12 months. 2. Bilateral nephrolithiasis. 3. Aortic Atherosclerosis (ICD10-I70.0) and Emphysema (ICD10-J43.9). 4. A left renal 7 mm lesion is too small to characterize but demonstrates complexity. Most likely a hemorrhagic cyst. This can be re-evaluated on routine lung cancer screening CT in 1 year.  PET Scan 02/16/2024  IMPRESSION: The nodular in question along the superior segment of the right lower lobe is smaller today by CT scan has no significant abnormal radiotracer uptake. This could be infectious or inflammatory. Recommend simple follow up surveillance with CT scan in 3 months to assess for evolution.   Mediastinum/Nodes: No mediastinal lymphadenopathy. Limited hilar assessment in the absence of IV contrast. Unremarkable esophagus.   Lungs/Pleura: Moderate emphysema with mild diffuse bronchial thickening. Minimal retained secretions in the tracheobronchial tree. No consolidative airspace disease. No pleural fluid. Multiple bilateral pulmonary nodules, largest measuring 5.1 mm mean diameter, are unchanged from prior exam. Subpleural nodular opacity in the superior segment of the right lower lobe with a mean diameter of 13.7 mm, series 9, image 154, is new from 2020 exam. Airspace disease was seen on 2024 chest CT, this is indeterminate for nodule versus scarring. New subpleural 5.2 mm nodular density in the right lower lobe, series 9, image 266.     LDCT Chest 01/2024 Upper Abdomen: No acute upper abdominal findings. Stable left adrenal thickening.   Musculoskeletal: There are no acute or suspicious osseous abnormalities.   IMPRESSION: 1.  Lung-RADS 4B, suspicious. Additional imaging evaluation or consultation with Pulmonology or Thoracic Surgery recommended. Subpleural nodular opacity in the right lower lobe measuring 13.7 mm mean diameter is new from 2020 exam. This is at site of  previous airspace disease on intervening chest CT, and indeterminate for scarring versus nodule. Could be considered to assess for metabolic activity. 2. New subpleural nodular density in the right lower lobe with mean diameter of 5.2 mm, also indeterminate for nodule versus scarring. In isolation, this is a Lung-RADS 3 nodule. 3. Additional pulmonary nodules are stable. 4. Coronary artery calcifications. 5. Aortic Atherosclerosis (ICD10-I70.0) and Emphysema (ICD10-J43.9).     PFT 02/2019             Latest Ref Rng & Units 03/11/2024    1:54 PM 01/16/2023   11:51 AM 01/15/2023    3:15 AM  CBC  WBC 4.0 - 10.5 K/uL 8.0  9.2  8.4   Hemoglobin 13.0 - 17.0 g/dL 84.7  87.0  87.1   Hematocrit 39.0 - 52.0 % 46.4  37.0  36.4   Platelets 150 - 400 K/uL 216  433  381        Latest Ref Rng & Units 03/17/2023   11:20 AM 01/16/2023   11:51 AM 01/15/2023   10:24 AM  BMP  Glucose 70 - 99 mg/dL 93  87  80   BUN 8 - 27 mg/dL 9  6  10    Creatinine 0.76 - 1.27 mg/dL 8.90  9.33  9.40   BUN/Creat Ratio 10 - 24 8     Sodium 134 - 144 mmol/L 143  131  127   Potassium 3.5 - 5.2 mmol/L 4.8  4.2  4.2   Chloride 96 - 106 mmol/L 103  99  94   CO2 20 - 29 mmol/L 23  24  24    Calcium  8.6 - 10.2 mg/dL 9.9  8.4  8.5     BNP    Component Value Date/Time   BNP 30.0 06/05/2018 1022    ProBNP No results found for: PROBNP  PFT    Component Value Date/Time   FEV1PRE 2.72 02/03/2019 0838   FEV1POST 3.35 02/03/2019 0838   FVCPRE 4.57 02/03/2019 0838   FVCPOST 4.81 02/03/2019 0838   TLC 8.51 02/03/2019 0838   DLCOUNC 22.53 02/03/2019 0838   PREFEV1FVCRT 59 02/03/2019 0838   PSTFEV1FVCRT 70 02/03/2019 0838    CT CHEST LCS NODULE F/U LOW DOSE WO CONTRAST Result Date: 05/21/2024 CLINICAL DATA:  95 pack-year smoking history/current smoker EXAM: CT CHEST WITHOUT CONTRAST FOR LUNG CANCER SCREENING NODULE FOLLOW-UP TECHNIQUE: Multidetector CT imaging of the chest was performed following the  standard protocol without IV contrast. RADIATION DOSE REDUCTION: This exam was performed according to the departmental dose-optimization program which includes automated exposure control, adjustment of the mA and/or kV according to patient size and/or use of iterative reconstruction technique. COMPARISON:  01/12/2024 FINDINGS: Cardiovascular: Aortic atherosclerosis. Normal heart size, without pericardial effusion. Lad and right coronary artery calcification. Mediastinum/Nodes: No mediastinal or hilar adenopathy, given limitations of unenhanced CT. Lungs/Pleura: No pleural fluid. Moderate centrilobular emphysema. Decreased right lower lobe opacity, consistent with evolving scar. Scattered pulmonary nodules of maximally 6.8 mm are unchanged. Upper Abdomen: Normal imaged portions of the liver, spleen, stomach, pancreas, gallbladder, adrenal glands. Punctate bilateral renal collecting system calculi. 7 mm hyperattenuating central left renal lesion is too small to characterize. Musculoskeletal: No acute osseous abnormality. IMPRESSION: 1. Lung-RADS 2, benign appearance or behavior. Continue annual screening with  low-dose chest CT without contrast in 12 months. 2. Bilateral nephrolithiasis. 3. Aortic Atherosclerosis (ICD10-I70.0) and Emphysema (ICD10-J43.9). 4. A left renal 7 mm lesion is too small to characterize but demonstrates complexity. Most likely a hemorrhagic cyst. This can be re-evaluated on routine lung cancer screening CT in 1 year. Electronically Signed   By: Rockey Kilts M.D.   On: 05/21/2024 11:33     Past medical hx Past Medical History:  Diagnosis Date   Chronic constipation    COPD (chronic obstructive pulmonary disease) (HCC)    Depression    GERD (gastroesophageal reflux disease)    Headache    Hypertension      Social History   Tobacco Use   Smoking status: Every Day    Current packs/day: 3.00    Average packs/day: 3.0 packs/day for 53.8 years (161.4 ttl pk-yrs)    Types:  Cigarettes    Start date: 48   Smokeless tobacco: Former    Types: Chew    Quit date: 04/29/1987   Tobacco comments:    1 1/2 pack a day 34 yrs patient is aware he needs to quit         2 packs a day 05/28/2024 KRD  Vaping Use   Vaping status: Never Used  Substance Use Topics   Alcohol use: Yes    Alcohol/week: 0.0 standard drinks of alcohol    Comment: occasionally   Drug use: No    Mr.Settle reports that he has been smoking cigarettes. He started smoking about 53 years ago. He has a 161.4 pack-year smoking history. He quit smokeless tobacco use about 37 years ago.  His smokeless tobacco use included chew. He reports current alcohol use. He reports that he does not use drugs.  Tobacco Cessation: Ready to quit: Not Answered Counseling given: Not Answered Tobacco comments: 1 1/2 pack a day 34 yrs patient is aware he needs to quit   2 packs a day 05/28/2024 KRD Current every day smoker  Past surgical hx, Family hx, Social hx all reviewed.  Current Outpatient Medications on File Prior to Visit  Medication Sig   acetaminophen (TYLENOL) 500 MG tablet Take 500-1,000 mg by mouth daily as needed for mild pain or headache.   albuterol  (VENTOLIN  HFA) 108 (90 Base) MCG/ACT inhaler INHALE 2 PUFFS INTO THE LUNGS EVERY 6 HOURS AS NEEDED FOR SHORTNESS OF BREATH   amLODipine  (NORVASC ) 10 MG tablet Take 10 mg by mouth daily.   apixaban  (ELIQUIS ) 5 MG TABS tablet Take 1 tablet (5 mg total) by mouth 2 (two) times daily.   cyanocobalamin  1000 MCG tablet Take 1 tablet (1,000 mcg total) by mouth daily.   DULoxetine  (CYMBALTA ) 30 MG capsule TAKE 1 CAPSULE BY MOUTH EVERY DAY   famotidine  (PEPCID ) 20 MG tablet Take 1 tablet (20 mg total) by mouth daily.   folic acid  (FOLVITE ) 1 MG tablet Take 1 tablet (1 mg total) by mouth daily.   gabapentin  (NEURONTIN ) 300 MG capsule Take 1 capsule (300 mg total) by mouth 2 (two) times daily. (Patient taking differently: Take 300 mg by mouth 3 (three) times  daily.)   labetalol  (NORMODYNE ) 300 MG tablet Take 300 mg by mouth 2 (two) times daily.   lidocaine -prilocaine  (EMLA ) cream Apply 1 Application topically as needed.   losartan  (COZAAR ) 50 MG tablet Take 1 tablet (50 mg total) by mouth daily.   Multiple Vitamins-Minerals ([employer] SPECTRAVITE ADULTS) TABS Take 1 tablet by mouth daily.   OVER THE COUNTER MEDICATION CeraVite/ Antioxidant daily  pantoprazole  (PROTONIX ) 40 MG tablet Take 1 tablet (40 mg total) by mouth 2 (two) times daily.   pyridOXINE  (B-6) 50 MG tablet Take 1 tablet (50 mg total) by mouth daily for 30 days then as directed by MD   SENEXON-S 8.6-50 MG tablet Take 1 tablet by mouth daily.   thiamine  (VITAMIN B1) 100 MG tablet Take 1 tablet (100 mg total) by mouth daily.   umeclidinium-vilanterol (ANORO ELLIPTA ) 62.5-25 MCG/ACT AEPB INHALE 1 PUFF INTO THE LUNGS EVERY DAY   No current facility-administered medications on file prior to visit.     Allergies  Allergen Reactions   Effexor [Venlafaxine Hydrochloride] Other (See Comments)    unknown    Review Of Systems:  Constitutional:   No  weight loss, night sweats,  Fevers, chills, fatigue, or  lassitude.  HEENT:   No headaches,  Difficulty swallowing,  Tooth/dental problems, or  Sore throat,                No sneezing, itching, ear ache, nasal congestion, post nasal drip,   CV:  No chest pain,  Orthopnea, PND, swelling in lower extremities, anasarca, dizziness, palpitations, syncope.   GI  No heartburn, indigestion, abdominal pain, nausea, vomiting, diarrhea, change in bowel habits, loss of appetite, bloody stools.   Resp: + shortness of breath with exertion none  at rest.  No excess mucus, no productive cough,  No non-productive cough,  No coughing up of blood.  No change in color of mucus.  No wheezing.  No chest wall deformity  Skin: no rash or lesions.  GU: no dysuria, change in color of urine, no urgency or frequency.  No flank pain, no hematuria   MS:  No joint pain  or swelling.  No decreased range of motion.  No back pain.  Psych:  No change in mood or affect. No depression or anxiety.  No memory loss.   Vital Signs BP 110/72   Pulse 63   Temp 97.9 F (36.6 C) (Temporal)   Ht 5' 9 (1.753 m)   Wt 200 lb 3.2 oz (90.8 kg)   SpO2 97%   BMI 29.56 kg/m    Physical Exam MEASUREMENTS: Weight- 200 lbs GENERAL: No distress, alert and oriented times 3. EARS NOSE THROAT: No sinus tenderness, tympanic membranes clear, pale nasal mucosa, no oral exudate, no post nasal drip, no lymphadenopathy. CHEST: Lungs clear to auscultation upright. No wheeze, rales, dullness, no accessory muscle use, no nasal flaring, no sternal retractions. CARDIAC: S1, S2, regular rate and rhythm, no murmur. ABDOMINAL: Soft, non tender. ND, BS present. EXTREMITIES: No clubbing, cyanosis, edema. No obvious deformities. NEUROLOGICAL: Normal strength. Alert and oriented x 3, MAE x 4. SKIN: No rashes, warm and dry. No obvious skin lesions. PSYCHIATRIC: Normal mood and behavior.    Assessment & Plan Pulmonary nodule Current every day smoker  Pulmonary nodule decreased in size, suggesting benign etiology.  PET scan normal.  Further evaluation if hemoptysis or unexplained weight loss occurs. - Return to lung cancer screening program for annual scan in October 2026. - Instruct him to call if experiencing hemoptysis or unexplained weight loss.  Chronic obstructive pulmonary disease (COPD) Using Anoro and albuterol .  Reports nocturnal wheezing, possibly related to reflux.  - Continue Anoro as prescribed. - Rinse mouth after use - Try  albuterol  before bed to manage nocturnal wheezing. - Continue reflux medications - Avoid foods that cause/ worsen  reflux - Encourage smoking cessation. - Resources provided to assist with smoking  cessation  Gastroesophageal reflux disease (GERD) Nocturnal wheezing possibly related to GERD. On Pepcid  and Protonix . Advised dietary  modifications. - Continue Pepcid  and Protonix  as prescribed. - Provide dietary guidance to avoid foods that exacerbate reflux, such as tomatoes.  Sedentary lifestyle Spends significant time lying down, contributing to weight gain.  Denies depression, lacks physical activity. - Encourage daily physical activity, such as walking.  I spent 20 minutes dedicated to the care of this patient on the date of this encounter to include pre-visit review of records, face-to-face time with the patient discussing conditions above, post visit ordering of testing, clinical documentation with the electronic health record, making appropriate referrals as documented, and communicating necessary information to the patient's healthcare team.      Lauraine JULIANNA Lites, NP 05/28/2024  12:21 PM

## 2024-05-28 NOTE — Patient Instructions (Addendum)
 It is good to see you today. Your CT Chest shows the nodule of concern has actually decreased in size.  This is reassuring. We will return you to annual lung cancer screening. Your next scan will be due 05/2025.  Call for any blood in your sputum or unexplained weight loss so we can get you in sooner to be evaluated. Please contact office for sooner follow up if symptoms do not improve or worsen or seek emergency care   Please work on quitting smoking. You can receive free nicotine  replacement therapy (patches, gum, or mints) by calling 1-800-QUIT NOW. Please call so we can get you on the path to becoming a non-smoker. I know it is hard, but you can do this!  Hypnosis for smoking cessation  [employer] [phone]  Acupuncture for smoking cessation  [employer] [phone]

## 2024-09-01 ENCOUNTER — Ambulatory Visit: Admitting: Adult Health

## 2024-09-16 ENCOUNTER — Inpatient Hospital Stay

## 2024-09-23 ENCOUNTER — Ambulatory Visit: Admitting: Oncology

## 2024-09-23 ENCOUNTER — Inpatient Hospital Stay: Admitting: Oncology
